# Patient Record
Sex: Female | Born: 1970 | Race: White | Hispanic: No | Marital: Married | Smoking: Never smoker
Health system: Southern US, Community
[De-identification: ages and names within clinical notes are randomized; demographics above are authoritative.]

## PROBLEM LIST (undated history)

## (undated) DIAGNOSIS — G4730 Sleep apnea, unspecified: Secondary | ICD-10-CM

## (undated) DIAGNOSIS — F419 Anxiety disorder, unspecified: Secondary | ICD-10-CM

## (undated) DIAGNOSIS — F32A Depression, unspecified: Secondary | ICD-10-CM

## (undated) DIAGNOSIS — T7840XA Allergy, unspecified, initial encounter: Secondary | ICD-10-CM

## (undated) DIAGNOSIS — I10 Essential (primary) hypertension: Secondary | ICD-10-CM

## (undated) DIAGNOSIS — L309 Dermatitis, unspecified: Secondary | ICD-10-CM

## (undated) DIAGNOSIS — T783XXA Angioneurotic edema, initial encounter: Secondary | ICD-10-CM

## (undated) DIAGNOSIS — F329 Major depressive disorder, single episode, unspecified: Secondary | ICD-10-CM

## (undated) HISTORY — PX: THYROID CYST EXCISION: SHX2511

## (undated) HISTORY — DX: Depression, unspecified: F32.A

## (undated) HISTORY — DX: Anxiety disorder, unspecified: F41.9

## (undated) HISTORY — DX: Allergy, unspecified, initial encounter: T78.40XA

## (undated) HISTORY — PX: DILATION AND CURETTAGE OF UTERUS: SHX78

## (undated) HISTORY — DX: Dermatitis, unspecified: L30.9

## (undated) HISTORY — DX: Angioneurotic edema, initial encounter: T78.3XXA

## (undated) HISTORY — DX: Essential (primary) hypertension: I10

## (undated) HISTORY — DX: Major depressive disorder, single episode, unspecified: F32.9

---

## 2003-04-14 ENCOUNTER — Emergency Department (HOSPITAL_COMMUNITY): Admission: EM | Admit: 2003-04-14 | Discharge: 2003-04-14 | Payer: Self-pay | Admitting: Emergency Medicine

## 2003-12-02 ENCOUNTER — Ambulatory Visit (HOSPITAL_COMMUNITY): Admission: RE | Admit: 2003-12-02 | Discharge: 2003-12-02 | Payer: Self-pay | Admitting: Obstetrics & Gynecology

## 2004-01-13 ENCOUNTER — Ambulatory Visit (HOSPITAL_COMMUNITY): Admission: RE | Admit: 2004-01-13 | Discharge: 2004-01-13 | Payer: Self-pay | Admitting: Obstetrics & Gynecology

## 2004-03-07 HISTORY — PX: TUBAL LIGATION: SHX77

## 2004-06-07 ENCOUNTER — Inpatient Hospital Stay (HOSPITAL_COMMUNITY): Admission: RE | Admit: 2004-06-07 | Discharge: 2004-06-10 | Payer: Self-pay | Admitting: Obstetrics & Gynecology

## 2004-06-07 ENCOUNTER — Encounter (INDEPENDENT_AMBULATORY_CARE_PROVIDER_SITE_OTHER): Payer: Self-pay | Admitting: Specialist

## 2005-08-18 ENCOUNTER — Ambulatory Visit: Payer: Self-pay | Admitting: Family Medicine

## 2005-08-19 ENCOUNTER — Encounter: Admission: RE | Admit: 2005-08-19 | Discharge: 2005-08-19 | Payer: Self-pay | Admitting: Sports Medicine

## 2006-05-12 ENCOUNTER — Telehealth: Payer: Self-pay | Admitting: *Deleted

## 2006-06-14 ENCOUNTER — Telehealth (INDEPENDENT_AMBULATORY_CARE_PROVIDER_SITE_OTHER): Payer: Self-pay | Admitting: *Deleted

## 2007-03-28 ENCOUNTER — Encounter (INDEPENDENT_AMBULATORY_CARE_PROVIDER_SITE_OTHER): Payer: Self-pay | Admitting: *Deleted

## 2007-03-28 ENCOUNTER — Telehealth: Payer: Self-pay | Admitting: *Deleted

## 2007-03-28 ENCOUNTER — Ambulatory Visit: Payer: Self-pay | Admitting: Family Medicine

## 2007-03-28 DIAGNOSIS — N390 Urinary tract infection, site not specified: Secondary | ICD-10-CM

## 2007-03-28 DIAGNOSIS — R8290 Unspecified abnormal findings in urine: Secondary | ICD-10-CM | POA: Insufficient documentation

## 2007-03-28 DIAGNOSIS — N760 Acute vaginitis: Secondary | ICD-10-CM | POA: Insufficient documentation

## 2007-03-28 LAB — CONVERTED CEMR LAB
Beta hcg, urine, semiquantitative: NEGATIVE
Bilirubin Urine: NEGATIVE
Blood in Urine, dipstick: NEGATIVE
Chlamydia, DNA Probe: NEGATIVE
Epithelial cells, urine: >20 /lpf
GC Probe Amp, Genital: NEGATIVE
Glucose, Urine, Semiquant: NEGATIVE
Ketones, urine, test strip: NEGATIVE
Nitrite: NEGATIVE
Protein, U semiquant: NEGATIVE
Specific Gravity, Urine: 1.025
Urobilinogen, UA: 0.2
Whiff Test: NEGATIVE
pH: 5.5

## 2007-04-03 ENCOUNTER — Encounter: Payer: Self-pay | Admitting: Family Medicine

## 2010-07-23 NOTE — Op Note (Signed)
Wanda Williams, Wanda Williams               ACCOUNT NO.:  [id number]   MEDICAL RECORD NO.:  [id number]          PATIENT TYPE:  INP   LOCATION:  9129                          FACILITY:  [facility]   PHYSICIAN:  Roseanna Rainbow, M.D.DATE OF BIRTH:  1970/07/25   DATE OF PROCEDURE:  06/07/2004  DATE OF DISCHARGE:                                 OPERATIVE REPORT   PREOPERATIVE DIAGNOSIS:  Intrauterine pregnancy at term, history of a  previous cesarean delivery, declines trial of labor, desires sterilization  procedure.   POSTOPERATIVE DIAGNOSIS:  Intrauterine pregnancy at term, history of a  previous cesarean delivery, declines trial of labor, desires sterilization  procedure.   PROCEDURE:  Repeat cesarean delivery and modified P[place]oy bilateral tubal  ligation.   SURGEONS:  Dr. Anne Fu   ANESTHESIA:  Spinal.   ESTIMATED BLOOD LOSS:  600 mL.   COMPLICATIONS:  None.   IV FLUIDS AND URINE OUTPUT:  As per anesthesiology.   DESCRIPTION OF PROCEDURE:  The patient was taken to the operating room and  spinal anesthetic was administered without difficulty. She was then placed  in the dorsal supine position with a leftward tilt and prepped and draped in  the usual sterile fashion. A Pfannenstiel skin incision was then made with  the scalpel through the previous scar and carried down to the underlying  fascia. The fascia was nicked in the midline. The fascial incision was then  extended bilaterally with curved Mayo scissors. The superior aspect of the  fascial incision was then tented up with Kocher clamps and the underlying  rectus muscles dissected off. The inferior aspect of the fascial incision  was manipulated in a similar fashion. The rectus muscles were separated in  the midline. The parietoperitoneum was tented up and entered sharply with  the scalpel. A peritoneal incision was then extended superiorly and  inferiorly with good visualization of the bladder. The bladder  blade was  then placed. The vesicouterine peritoneum was tented up and entered sharply  with the Metzenbaum scissors. The bladder flap was then created sharply. The  bladder blade was then replaced. The lower uterine segment was incised in a  transverse fashion with a scalpel. This incision was then extended  bilaterally with the bandage scissors. The infant's head was then delivered  atraumatically. A nuchal cord was noted and was readily reduced. The  oropharynx was suctioned with the bulb suction. The cord was clamped and  cut. The infant was handed off to the waiting neonatologist. The placenta  was then removed, the intrauterine cavity was evacuated of any remaining  amniotic fluid, clots and debris with a moist laparotomy sponge. The uterine  incision was then reapproximated in a running interlocking fashion with #0  Monocryl. A second imbricating layer was then placed. Adequate hemostasis  was noted. The uterus was then exteriorized. The mid isthmic portion of the  left fallopian tube was grasped with a Babcock clamp. Two free ligatures of  #0 plain were then placed. The segment of tube was then excised. The right  fallopian tube was then manipulated in a similar fashion. The  uterus was  returned to the abdomen. The paracolic gutters were then copiously  irrigated. The parietoperitoneum was reapproximated in a running fashion  with 2-0 Vicryl. The fascia was reapproximated in a  running fashion with #0 PDS. The skin was reapproximated with staples. At  the close of the procedure, the instrument and pack counts were said to be  correct x2. One gram of cephazolin was given at cord clamp. The patient was  taken to the PACU awake and in stable condition.      LAJ/MEDQ  D:  06/07/2004  T:  06/07/2004  Job:  [id number]
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
## 2010-11-10 ENCOUNTER — Other Ambulatory Visit: Payer: Self-pay | Admitting: Family Medicine

## 2010-11-10 ENCOUNTER — Ambulatory Visit (INDEPENDENT_AMBULATORY_CARE_PROVIDER_SITE_OTHER): Payer: Managed Care, Other (non HMO) | Admitting: Family Medicine

## 2010-11-10 ENCOUNTER — Encounter: Payer: Self-pay | Admitting: Family Medicine

## 2010-11-10 VITALS — BP 146/101 | HR 78 | Temp 98.8°F | Ht <= 58 in | Wt 164.0 lb

## 2010-11-10 DIAGNOSIS — I10 Essential (primary) hypertension: Secondary | ICD-10-CM | POA: Insufficient documentation

## 2010-11-10 DIAGNOSIS — Z23 Encounter for immunization: Secondary | ICD-10-CM

## 2010-11-10 DIAGNOSIS — R82998 Other abnormal findings in urine: Secondary | ICD-10-CM

## 2010-11-10 DIAGNOSIS — R5381 Other malaise: Secondary | ICD-10-CM

## 2010-11-10 NOTE — Patient Instructions (Addendum)
Nice to meet you. Your symptoms may be related to your high blood pressure 145/102. Normal is less than 130/80. Start HCTZ daily. Make an appointment for physical and BP follow up in next 2-3 weeks.  Hypertension (High Blood Pressure) As your heart beats, it forces blood through your arteries. This force is your blood pressure. If the pressure is too high, it is called hypertension (HTN) or high blood pressure. HTN is dangerous because you may have it and not know it. High blood pressure may mean that your heart has to work harder to pump blood. Your arteries may be narrow or stiff. The extra work puts you at risk for heart disease, stroke, and other problems.  Blood pressure consists of two numbers, a higher number over a lower, 110/72, for example. It is stated as "110 over 72." The ideal is below 120 for the top number (systolic) and under 80 for the bottom (diastolic). Write down your blood pressure today. You should pay close attention to your blood pressure if you have certain conditions such as:  Heart failure.  Prior heart attack.   Diabetes   Chronic kidney disease.   Prior stroke.   Multiple risk factors for heart disease.   To see if you have HTN, your blood pressure should be measured while you are seated with your arm held at the level of the heart. It should be measured at least twice. A one-time elevated blood pressure reading (especially in the Emergency Department) does not mean that you need treatment. There may be conditions in which the blood pressure is different between your right and left arms. It is important to see your caregiver soon for a recheck. Most people have essential hypertension which means that there is not a specific cause. This type of high blood pressure may be lowered by changing lifestyle factors such as:  Stress.  Smoking.   Lack of exercise.   Excessive weight.  Drug/tobacco/alcohol use.   Eating less salt.   Most people do not have symptoms  from high blood pressure until it has caused damage to the body. Effective treatment can often prevent, delay or reduce that damage. TREATMENT Treatment for high blood pressure, when a cause has been identified, is directed at the cause. There are a large number of medications to treat HTN. These fall into several categories, and your caregiver will help you select the medicines that are best for you. Medications may have side effects. You should review side effects with your caregiver. If your blood pressure stays high after you have made lifestyle changes or started on medicines,   Your medication(s) may need to be changed.   Other problems may need to be addressed.   Be certain you understand your prescriptions, and know how and when to take your medicine.   Be sure to follow up with your caregiver within the time frame advised (usually within two weeks) to have your blood pressure rechecked and to review your medications.   If you are taking more than one medicine to lower your blood pressure, make sure you know how and at what times they should be taken. Taking two medicines at the same time can result in blood pressure that is too low.  SEEK IMMEDIATE MEDICAL CARE IF YOU DEVELOP:  A severe headache, blurred or changing vision, or confusion.   Unusual weakness or numbness, or a faint feeling.   Severe chest or abdominal pain, vomiting, or breathing problems.  MAKE SURE YOU:  Understand these instructions.   Will watch your condition.   Will get help right away if you are not doing well or get worse.  Document Released: 02/21/2005 Document Re-Released: 08/11/2009 [facility] Patient Information 2011 [place], [place].

## 2010-11-11 ENCOUNTER — Telehealth: Payer: Self-pay | Admitting: Family Medicine

## 2010-11-11 DIAGNOSIS — D649 Anemia, unspecified: Secondary | ICD-10-CM

## 2010-11-11 DIAGNOSIS — R5381 Other malaise: Secondary | ICD-10-CM | POA: Insufficient documentation

## 2010-11-11 LAB — CBC
HCT: 35.2 % — ABNORMAL LOW (ref 36.0–46.0)
Hemoglobin: 10.8 g/dL — ABNORMAL LOW (ref 12.0–15.0)
MCH: 26.3 pg (ref 26.0–34.0)
MCHC: 30.7 g/dL (ref 30.0–36.0)
MCV: 85.6 fL (ref 78.0–100.0)
Platelets: 485 10*3/uL — ABNORMAL HIGH (ref 150–400)
RBC: 4.11 MIL/uL (ref 3.87–5.11)
RDW: 16.2 % — ABNORMAL HIGH (ref 11.5–15.5)
WBC: 7.4 10*3/uL (ref 4.0–10.5)

## 2010-11-11 LAB — COMPREHENSIVE METABOLIC PANEL
ALT: 14 U/L (ref 0–35)
AST: 14 U/L (ref 0–37)
Albumin: 4.4 g/dL (ref 3.5–5.2)
Alkaline Phosphatase: 60 U/L (ref 39–117)
BUN: 13 mg/dL (ref 6–23)
CO2: 24 mEq/L (ref 19–32)
Calcium: 9.6 mg/dL (ref 8.4–10.5)
Chloride: 104 mEq/L (ref 96–112)
Creat: 0.55 mg/dL (ref 0.50–1.10)
Glucose, Bld: 109 mg/dL — ABNORMAL HIGH (ref 70–99)
Potassium: 4.3 mEq/L (ref 3.5–5.3)
Sodium: 141 mEq/L (ref 135–145)
Total Bilirubin: 0.2 mg/dL — ABNORMAL LOW (ref 0.3–1.2)
Total Protein: 7.3 g/dL (ref 6.0–8.3)

## 2010-11-11 LAB — ANEMIA PANEL
%SAT: 4 % — ABNORMAL LOW (ref 20–55)
ABS Retic: 45 10*3/uL (ref 19.0–186.0)
Ferritin: 6 ng/mL — ABNORMAL LOW (ref 10–291)
Folate: >20 ng/mL
Iron: 17 ug/dL — ABNORMAL LOW (ref 42–145)
RBC.: 4.09 MIL/uL (ref 3.87–5.11)
Retic Ct Pct: 1.1 % (ref 0.4–2.3)
TIBC: 454 ug/dL (ref 250–470)
UIBC: 437 ug/dL — ABNORMAL HIGH (ref 125–400)
Vitamin B-12: 310 pg/mL (ref 211–911)

## 2010-11-11 LAB — TSH: TSH: 1.805 u[IU]/mL (ref 0.350–4.500)

## 2010-11-11 MED ORDER — HYDROCHLOROTHIAZIDE 25 MG PO TABS: 25.0000 mg | ORAL_TABLET | Freq: Every day | ORAL | Status: DC

## 2010-11-11 NOTE — Assessment & Plan Note (Signed)
Possibly related to stress with husband's medical conditions vs organic causes. WIll start with CBC, BMET, TSH today. Follow up with [facility] in next 2 weeks for routine screening.

## 2010-11-11 NOTE — Progress Notes (Signed)
  Subjective:    Patient ID: Wanda Williams, female    DOB: [birth year], 40 y.o.   MRN: [id number]  HPI  1. Fatigue/"not feeling well"--Complains for several months she is not feeling well. Feels tired and sometimes nervous. A small level of anxiety regarding her husband's health problems, who had a back injury and planned for back surgery next week. On ROS questioning, she does recall some episodes of fluttering in her chest, but cannot recall the last episode. Denies chest pain, dyspnea, irregular heartbeat, panic attacks, weight changes.  2. Urine odor. For past year has an odor to her urine. No dysuria, vaginal discharge, abdominal pain or fevers.  3. HM. Having regular menstrual periods lasting 6-7 days. Does not some bleeding after intercourse for 2 days after her menses is complete, otherwise no bleeding between cycles.   4. BP. Has never been told she has high BP. Measures infrequently at home but last she remembers was 120s/70s this year. Denies HA, CP, edema. Has never smoked tobacco.   Review of Systems See HPI otherwise negative.     Objective:   Physical Exam  Vitals reviewed. Constitutional: She is oriented to person, place, and time. She appears well-developed and well-nourished. No distress.  HENT:  Head: Normocephalic and atraumatic.  Mouth/Throat: Oropharynx is clear and moist. No oropharyngeal exudate.  Eyes: EOM are normal. Pupils are equal, round, and reactive to light.  Neck: Neck supple. No thyromegaly present.  Cardiovascular: Normal rate, regular rhythm, normal heart sounds and intact distal pulses.   No murmur heard. Pulmonary/Chest: Effort normal and breath sounds normal. No respiratory distress. She has no wheezes. She has no rales.  Musculoskeletal: She exhibits no edema and no tenderness.  Lymphadenopathy:    She has no cervical adenopathy.  Neurological: She is alert and oriented to person, place, and time. No cranial nerve deficit. Coordination normal.    Skin: She is not diaphoretic.          Assessment & Plan:

## 2010-11-11 NOTE — Telephone Encounter (Signed)
Called patient to discuss anemia. She will start oral OTC iron supplementation and follow up at CPE next month. Her BP is 128/81 at work today. She will start HCTZ at 12.5 mg daily instead of whole tablet.

## 2010-11-11 NOTE — Assessment & Plan Note (Signed)
New problem today. No signs of end-organ damage, will check labs today to evaluate for secondary etiologies. Start HCTZ and follow up in next 2 weeks to assess response. Goal is <140/90.

## 2010-11-11 NOTE — Assessment & Plan Note (Addendum)
Chronic odor, but no signs or symptoms of UTI. Will check wet prep to evaluate for BV when patient follows up for CPE or check UA if dysuria or additional symptoms develop.

## 2010-11-29 ENCOUNTER — Encounter: Payer: Self-pay | Admitting: Family Medicine

## 2010-11-29 ENCOUNTER — Ambulatory Visit (INDEPENDENT_AMBULATORY_CARE_PROVIDER_SITE_OTHER): Payer: Managed Care, Other (non HMO) | Admitting: Family Medicine

## 2010-11-29 ENCOUNTER — Other Ambulatory Visit (HOSPITAL_COMMUNITY)
Admission: RE | Admit: 2010-11-29 | Discharge: 2010-11-29 | Disposition: A | Discharge status: Home / Self Care | Payer: Managed Care, Other (non HMO) | Source: Ambulatory Visit | Attending: Family Medicine | Admitting: Family Medicine

## 2010-11-29 VITALS — BP 144/86 | HR 72 | Temp 98.2°F | Ht <= 58 in | Wt 167.8 lb

## 2010-11-29 DIAGNOSIS — Z23 Encounter for immunization: Secondary | ICD-10-CM

## 2010-11-29 DIAGNOSIS — R82998 Other abnormal findings in urine: Secondary | ICD-10-CM

## 2010-11-29 DIAGNOSIS — D509 Iron deficiency anemia, unspecified: Secondary | ICD-10-CM

## 2010-11-29 DIAGNOSIS — Z01419 Encounter for gynecological examination (general) (routine) without abnormal findings: Secondary | ICD-10-CM | POA: Insufficient documentation

## 2010-11-29 DIAGNOSIS — I10 Essential (primary) hypertension: Secondary | ICD-10-CM

## 2010-11-29 DIAGNOSIS — Z124 Encounter for screening for malignant neoplasm of cervix: Secondary | ICD-10-CM

## 2010-11-29 DIAGNOSIS — N760 Acute vaginitis: Secondary | ICD-10-CM

## 2010-11-29 DIAGNOSIS — Z862 Personal history of diseases of the blood and blood-forming organs and certain disorders involving the immune mechanism: Secondary | ICD-10-CM | POA: Insufficient documentation

## 2010-11-29 DIAGNOSIS — R8290 Unspecified abnormal findings in urine: Secondary | ICD-10-CM

## 2010-11-29 LAB — CBC
HCT: 34.6 % — ABNORMAL LOW (ref 36.0–46.0)
Hemoglobin: 11 g/dL — ABNORMAL LOW (ref 12.0–15.0)
MCH: 27.5 pg (ref 26.0–34.0)
MCHC: 31.8 g/dL (ref 30.0–36.0)
MCV: 86.5 fL (ref 78.0–100.0)
Platelets: 444 10*3/uL — ABNORMAL HIGH (ref 150–400)
RBC: 4 MIL/uL (ref 3.87–5.11)
RDW: 17.5 % — ABNORMAL HIGH (ref 11.5–15.5)
WBC: 6.1 10*3/uL (ref 4.0–10.5)

## 2010-11-29 LAB — POCT WET PREP (WET MOUNT)
Clue Cells Wet Prep HPF POC: NEGATIVE
Trichomonas Wet Prep HPF POC: NEGATIVE
Yeast Wet Prep HPF POC: NEGATIVE

## 2010-11-29 LAB — LIPID PANEL
Cholesterol: 147 mg/dL (ref 0–200)
HDL: 51 mg/dL (ref >39)
LDL Cholesterol: 75 mg/dL (ref 0–99)
Total CHOL/HDL Ratio: 2.9 Ratio
Triglycerides: 105 mg/dL (ref <150)
VLDL: 21 mg/dL (ref 0–40)

## 2010-11-29 LAB — POCT GLYCOSYLATED HEMOGLOBIN (HGB A1C): Hemoglobin A1C: 5.6

## 2010-11-29 NOTE — Assessment & Plan Note (Signed)
Likely secondary to menstrual bleeding. On oral iron replacement for several weeks. Will check CBC today and continue iron sulfate. Continue routine cancer screening and pap done today.

## 2010-11-29 NOTE — Patient Instructions (Signed)
I will call you with results if abnormal. Make an appointment in 6 months or sooner if needed. Continue checking BP at home for goal less than 140/90.  Iron Deficiency Anemia There are many types of anemia. Iron deficiency anemia is the most common. Iron deficiency anemia is a decrease in the number of red blood cells caused by too little iron. Without enough iron, your body does not produce enough hemoglobin. Hemoglobin is a substance in red blood cells that carries oxygen to the body's tissues. Iron deficiency anemia may leave you tired and short of breath. CAUSES  Lack of iron in the diet.   This may be seen in infants and children, because there is little iron in milk.   This may be seen in adults who do not eat enough iron-rich foods.   This may be seen in pregnant or breastfeeding women who do not take iron supplements. There is a much higher need for iron intake at these times.   Poor absorption of iron, as seen with intestinal disorders, such as surgical removal of the small intestines or intestinal bypass.   Intestinal bleeding.   Heavy periods.  SYMPTOMS Mild anemia may not be noticeable. Symptoms may include:  Fatigue.   Headache.   Pale skin.   Weakness.   Shortness of breath.   Dizziness.   Cold hands and feet.   Fast or irregular heartbeat.  DIAGNOSIS Diagnosis requires a thorough evaluation and physical exam by your caregiver.  Blood tests are generally used to confirm iron deficiency anemia.   Additional tests may be done to find the underlying cause of your anemia. This may include:   Testing for blood in the stool (fecal occult blood test).   A procedure to see inside the colon and rectum (colonoscopy).   A procedure to see inside the esophagus and stomach (endoscopy).  TREATMENT  Correcting the cause of the iron deficiency is the first step.   Medicines, such as oral contraceptives, can make heavy menstrual flows lighter.   Antibiotics and  other medicines can be used to treat peptic ulcers.   Surgery may be needed to remove a bleeding polyp, tumor, or fibroid.   Often, iron supplements (ferrous sulfate) are taken.   For the best iron absorption, take these supplements with an empty stomach.   You may need to take the supplements with food if you cannot tolerate them on an empty stomach. Vitamin C improves the absorption of iron. Your caregiver might recommend taking your iron tablets with a glass of orange juice or vitamin C supplement.   Milk and antacids should not be taken at the same time as iron supplements. They may interfere with the absorption of iron.   Iron supplements can cause constipation. A stool softener is often recommended.   Pregnant and breastfeeding women will need to take extra iron, because their normal diet usually will not provide the required amount.   Patients who cannot tolerate iron by mouth can take it through a vein (intravenously) or by an injection into the muscle.  HOME CARE INSTRUCTIONS  Ask your dietitian for help with diet questions.   Take iron and vitamins as directed by your caregiver.   Eat a diet rich in iron. Eat liver, lean beef, whole-grain bread, eggs, dried fruit, and dark green, leafy vegetables.  SEEK IMMEDIATE MEDICAL CARE IF:  You have a fainting episode. Do not drive yourself. Call your local emergency services  (911 in [place] if no other help  is available.   You have chest pain, nausea, or vomiting.   You develop severe or increased shortness of breath with activities.   You develop weakness or increased thirst.   You have a rapid heartbeat.   You develop unexplained sweating or become lightheaded when getting up from a chair or bed.  MAKE SURE YOU:  Understand these instructions.   Will watch your condition.   Will get help right away if you are not doing well or get worse.  Document Released: 02/19/2000 Document Re-Released: 08/11/2009 [facility] Patient  Information 2011 [place], [place].

## 2010-11-29 NOTE — Assessment & Plan Note (Signed)
Will check wet prep to evaluate for BV. No symptoms of UTI (dysuria, fever, abdominal pain, frequency).

## 2010-11-29 NOTE — Progress Notes (Signed)
  Subjective:    Patient ID: Wanda Williams, female    DOB: [birth year], 40 y.o.   MRN: [id number]  HPI  1. HTN. Home measurements 112-130s/78-81 usually. Most values in 130s/70s per patient. She took HCTZ for several weeks but stopped because of a sore thigh muscle that has resolved now. Denies dyspnea, CP, edema, visual changes, syncope.  2. Anemia. Has been taking oral iron since our last discussion. No side effects. No abnormal bleeding, usually has regular menstrual cycles but did miss her last period. Has a hx of BTL.   3. HM. Home breast exams normal. Requests flu shot today. No change in weight. No history of abnormal pap smears.   Review of Systems See HPI othewise negative.    Objective:   Physical Exam  Constitutional: She is oriented to person, place, and time. She appears well-developed and well-nourished. No distress.  HENT:  Head: Normocephalic and atraumatic.  Eyes: EOM are normal. Pupils are equal, round, and reactive to light.  Neck: Neck supple. No thyromegaly present.  Cardiovascular: Normal rate, regular rhythm and normal heart sounds.  Exam reveals no gallop.   No murmur heard. Pulmonary/Chest: Effort normal and breath sounds normal.  Abdominal: Soft. She exhibits no distension. There is no tenderness.  Genitourinary: Vagina normal and uterus normal. No vaginal discharge found.       Breast exam wnl. No masses or skin changes.   Cervix wnl. Scant discharge. No tenderness on bimanual exam.  Musculoskeletal: Normal range of motion. She exhibits no edema and no tenderness.  Neurological: She is alert and oriented to person, place, and time. Coordination normal.  Skin: She is not diaphoretic.  Psychiatric: She has a normal mood and affect.          Assessment & Plan:

## 2010-11-29 NOTE — Assessment & Plan Note (Addendum)
At goal <135/80 usually at home. Encouraged patient to focus on lifestyle modifications and can avoid medications at this time pending no elevation in BP. She will check BP with cuff at home weekly. Will check risk stratification labs today: FLP, fasting glucose.  Follow up in 6 months.

## 2010-11-30 ENCOUNTER — Encounter: Payer: Self-pay | Admitting: Family Medicine

## 2012-02-22 ENCOUNTER — Encounter: Payer: Self-pay | Admitting: Family Medicine

## 2012-02-22 ENCOUNTER — Ambulatory Visit (INDEPENDENT_AMBULATORY_CARE_PROVIDER_SITE_OTHER): Payer: Managed Care, Other (non HMO) | Admitting: Family Medicine

## 2012-02-22 VITALS — BP 144/98 | HR 81 | Temp 98.3°F | Ht <= 58 in | Wt 175.0 lb

## 2012-02-22 DIAGNOSIS — M76899 Other specified enthesopathies of unspecified lower limb, excluding foot: Secondary | ICD-10-CM

## 2012-02-22 DIAGNOSIS — M7061 Trochanteric bursitis, right hip: Secondary | ICD-10-CM

## 2012-02-22 NOTE — Patient Instructions (Signed)

## 2012-02-22 NOTE — Progress Notes (Signed)
Wanda Williams is a 41 y.o. female who presents today for right hip pain x 4 days.  Began after awakening and worse with standing and flexing up her right leg.  Painful especially over the greater trochanter and worse when she lays on that area.  Pain does radiate down the lateral aspect of her leg to the lateral knee but does not cross the joint line.  Does not have fever, chills, numbness, tingling, burning, back pain, opposite hip pain.      Current Outpatient Prescriptions on File Prior to Visit  Medication Sig Dispense Refill  . ferrous sulfate 325 (65 FE) MG tablet Take 325 mg by mouth daily with breakfast.        . hydrochlorothiazide 25 MG tablet Take 1 tablet (25 mg total) by mouth daily.  90 tablet  1    ROS: Per HPI.  All other systems reviewed and are negative.   Physical Exam Filed Vitals:   02/22/12 1555  BP: 144/98  Pulse: 81  Temp: 98.3 F (36.8 C)    Physical Examination: General appearance - alert, well appearing, and in no distress and anxious Neurological - DTR's normal and symmetric, motor and sensory grossly normal bilaterally, normal muscle tone, no tremors, strength 5/5 Musculoskeletal - abnormal exam of right hip.  Full ROM of right hip but pain with adduction.  TTP at right greater trochanteric bursa. L hip normal ROM Extremities - peripheral pulses normal, no pedal edema, no clubbing or cyanosis, feet normal, good pulses, normal color, temperature and sensation

## 2012-02-22 NOTE — Assessment & Plan Note (Addendum)
Will do injection of dose of 1:4 Depo 80 AV:[id number] into joint.  Recommendations to ice the area tonight and to not do anything too strenuous over the next 24 hrs.  Risks and Benefits went over with pt.   Informed consent obtained and placed in chart.  Time out performed.  Area cleaned with iodine x 3 and wiped clear with alcohol swab.  Using 21 1/2 gauge needle 1 cc Depo and 4 cc's 1% Lidocaine were injected in greater trochanter of right hip.  Sterile bandage placed.  Patient tolerated procedure well.  No complications.

## 2012-03-05 ENCOUNTER — Ambulatory Visit: Payer: Managed Care, Other (non HMO) | Admitting: Family Medicine

## 2012-03-15 ENCOUNTER — Ambulatory Visit (INDEPENDENT_AMBULATORY_CARE_PROVIDER_SITE_OTHER): Payer: Managed Care, Other (non HMO) | Admitting: Family Medicine

## 2012-03-15 ENCOUNTER — Encounter: Payer: Self-pay | Admitting: Family Medicine

## 2012-03-15 VITALS — BP 128/74 | HR 74 | Temp 98.7°F | Ht <= 58 in | Wt 171.1 lb

## 2012-03-15 DIAGNOSIS — M25521 Pain in right elbow: Secondary | ICD-10-CM

## 2012-03-15 DIAGNOSIS — M25529 Pain in unspecified elbow: Secondary | ICD-10-CM

## 2012-03-15 DIAGNOSIS — I10 Essential (primary) hypertension: Secondary | ICD-10-CM

## 2012-03-15 DIAGNOSIS — M76899 Other specified enthesopathies of unspecified lower limb, excluding foot: Secondary | ICD-10-CM

## 2012-03-15 DIAGNOSIS — M7061 Trochanteric bursitis, right hip: Secondary | ICD-10-CM

## 2012-03-15 LAB — SEDIMENTATION RATE: Sed Rate: 27 mm/hr — ABNORMAL HIGH (ref 0–22)

## 2012-03-15 LAB — COMPREHENSIVE METABOLIC PANEL
ALT: 16 U/L (ref 0–35)
AST: 17 U/L (ref 0–37)
Albumin: 4.4 g/dL (ref 3.5–5.2)
Alkaline Phosphatase: 74 U/L (ref 39–117)
BUN: 18 mg/dL (ref 6–23)
CO2: 31 mEq/L (ref 19–32)
Calcium: 9 mg/dL (ref 8.4–10.5)
Chloride: 102 mEq/L (ref 96–112)
Creat: 0.62 mg/dL (ref 0.50–1.10)
Glucose, Bld: 92 mg/dL (ref 70–99)
Potassium: 3.7 mEq/L (ref 3.5–5.3)
Sodium: 140 mEq/L (ref 135–145)
Total Bilirubin: 0.3 mg/dL (ref 0.3–1.2)
Total Protein: 6.9 g/dL (ref 6.0–8.3)

## 2012-03-15 LAB — CBC
HCT: 35.8 % — ABNORMAL LOW (ref 36.0–46.0)
Hemoglobin: 12.4 g/dL (ref 12.0–15.0)
MCH: 30.4 pg (ref 26.0–34.0)
MCHC: 34.6 g/dL (ref 30.0–36.0)
MCV: 87.7 fL (ref 78.0–100.0)
Platelets: 467 10*3/uL — ABNORMAL HIGH (ref 150–400)
RBC: 4.08 MIL/uL (ref 3.87–5.11)
RDW: 13.8 % (ref 11.5–15.5)
WBC: 10.4 10*3/uL (ref 4.0–10.5)

## 2012-03-15 LAB — URIC ACID: Uric Acid, Serum: 5.7 mg/dL (ref 2.4–7.0)

## 2012-03-15 LAB — TSH: TSH: 2.769 u[IU]/mL (ref 0.350–4.500)

## 2012-03-15 MED ORDER — HYDROCHLOROTHIAZIDE 25 MG PO TABS: 12.5000 mg | ORAL_TABLET | Freq: Every day | ORAL | Status: DC

## 2012-03-15 NOTE — Patient Instructions (Addendum)
Goal BP is less than 140/90. If labs ok, will send letter. Go down to half tablet HCTZ, I sent refills. Get BP checked and report this back, call and leave message for Dr. Cristal Ford. Make appointment if hip/elbow doesn't improve, make appointment. Follow up in 6 months.

## 2012-03-16 ENCOUNTER — Encounter: Payer: Self-pay | Admitting: Family Medicine

## 2012-03-16 NOTE — Progress Notes (Signed)
  Subjective:    Patient ID: Wanda Williams, female    DOB: January 06, 1971, 42 y.o.   MRN: [id number]  HPI  1. HTN. Follows up after one year. She was inconsistent with her HCTZ but now taking 25 mg daily. Reports fluctuating control, but not checking frequently currently. Denies medication side effects, chest pain, edema, dyspnea, visual changes, headaches.   2. Right elbow pain. Notices ache on right lateral elbow for few days-weeks. This started after she was treated for hip bursitis with steroid injection 2 weeks ago.   Review of Systems Denies trauma, straining, heavy lifting, edema, redness, warmth, fever, chills, numbness, weakness, neck or shoulder pain.     Objective:   Physical Exam  Vitals reviewed. Constitutional: She is oriented to person, place, and time. She appears well-developed and well-nourished. No distress.  HENT:  Head: Normocephalic and atraumatic.  Mouth/Throat: Oropharynx is clear and moist. No oropharyngeal exudate.  Eyes: EOM are normal.  Neck: Neck supple. No thyromegaly present.  Cardiovascular: Normal rate, regular rhythm and normal heart sounds.   No murmur heard. Pulmonary/Chest: Effort normal and breath sounds normal. No respiratory distress. She has no wheezes. She has no rales.  Musculoskeletal: She exhibits no edema and no tenderness.       Right elbow appears wnl. No masses or erythema, no edema or bone defect.  Mild pain with full extension of elbow, ROM intact. Distal grip strength and sensation intact.   Neurological: She is alert and oriented to person, place, and time.  Skin: No rash noted. She is not diaphoretic.  Psychiatric: She has a normal mood and affect.          Assessment & Plan:

## 2012-03-16 NOTE — Assessment & Plan Note (Signed)
No red flags on exam, possibly some mild tendonitis. No definite signs of gout or other inflammatory arthritis, but slightly odd to have multiple joint aches simultaneously without a known trigger. Will check labs ESR, uric acid and CMET, CBC. Recommend prn motrin for now and observation. RTC if worsens or does not resolve will check plain films.

## 2012-03-16 NOTE — Assessment & Plan Note (Signed)
At goal on HCTZ full dose. Per our last conversation, her BP was actually at goal without medication. We discussed using low dose HCTZ at 12.5 mg daily to minimize any side effects, in the low chance her joint pains represent gout. Check labs today.

## 2012-03-16 NOTE — Assessment & Plan Note (Signed)
Improved today. Advised to f/u if doesn't completely resolve.

## 2012-10-29 ENCOUNTER — Encounter: Payer: Managed Care, Other (non HMO) | Admitting: Family Medicine

## 2013-02-01 ENCOUNTER — Emergency Department (HOSPITAL_COMMUNITY)
Admission: EM | Admit: 2013-02-01 | Discharge: 2013-02-02 | Disposition: A | Discharge status: Home / Self Care | Payer: Managed Care, Other (non HMO) | Attending: Emergency Medicine | Admitting: Emergency Medicine

## 2013-02-01 ENCOUNTER — Encounter (HOSPITAL_COMMUNITY): Payer: Self-pay | Admitting: Emergency Medicine

## 2013-02-01 DIAGNOSIS — Z79899 Other long term (current) drug therapy: Secondary | ICD-10-CM | POA: Insufficient documentation

## 2013-02-01 DIAGNOSIS — J029 Acute pharyngitis, unspecified: Secondary | ICD-10-CM | POA: Insufficient documentation

## 2013-02-01 DIAGNOSIS — R1110 Vomiting, unspecified: Secondary | ICD-10-CM | POA: Insufficient documentation

## 2013-02-01 LAB — RAPID STREP SCREEN (MED CTR MEBANE ONLY): Streptococcus, Group A Screen (Direct): NEGATIVE

## 2013-02-01 MED ORDER — HYDROCOD POLST-CHLORPHEN POLST 10-8 MG/5ML PO LQCR
5.0000 mL | Freq: Once | ORAL | Status: AC
  Administered 2013-02-01: 5 mL via ORAL
  Filled 2013-02-01: qty 5

## 2013-02-01 MED ORDER — ALBUTEROL SULFATE HFA 108 (90 BASE) MCG/ACT IN AERS
2.0000 | INHALATION_SPRAY | Freq: Once | RESPIRATORY_TRACT | Status: AC
  Administered 2013-02-01: 2 via RESPIRATORY_TRACT
  Filled 2013-02-01: qty 6.7

## 2013-02-01 NOTE — ED Notes (Signed)
Pt has a ride home.  

## 2013-02-01 NOTE — ED Notes (Signed)
Pt states it feels like her neck is swollen. C/o cough. States her chest hurts when she coughs. Pt has hoarse voice. Pt also states she vomited once. States symptoms started yesterday. Pt with no acute distress. Skin warm and dry.

## 2013-02-01 NOTE — ED Provider Notes (Signed)
CSN: [id number]     Arrival date & time 02/01/13  2109 History  This chart was scribed for non-physician practitioner, Kyung Bacca, PA-C working with Lyanne Co, MD by Greggory Stallion, ED scribe. This patient was seen in room WTR9/WTR9 and the patient's care was started at 11:13 PM.   Chief Complaint  Patient presents with  . Cough  . General Illness    The history is provided by the patient. No language interpreter was used.   HPI Comments: Wanda Williams is a 42 y.o. female who presents to the Emergency Department complaining of gradual onset, constant sore throat that started yesterday. Pt has associated cough and one episode of emesis due to the cough.   Cough aggravates throat pain.  She states her lymph nodes are very swollen.  She had an episode of dyspnea and wheezing while lying in bed this evening and her husband insisted she come to ED.   No associated fever, nasal congestion, rhinorrhea.  No CP other than when she coughs. Daughter with similar sx recently.  H/o thyroid cyst resection. hh No past medical history on file. Past Surgical History  Procedure Laterality Date  . Cesarean section    . Thyroid cyst excision     Family History  Problem Relation Age of Onset  . Heart disease Father    History  Substance Use Topics  . Smoking status: Never Smoker   . Smokeless tobacco: Not on file  . Alcohol Use: Yes     Comment: rare   OB History   Grav Para Term Preterm Abortions TAB SAB Ect Mult Living                 Review of Systems  Constitutional: Negative for fever.  HENT: Positive for sore throat.   Respiratory: Positive for cough. Negative for shortness of breath.   Gastrointestinal: Positive for vomiting.  All other systems reviewed and are negative.    Allergies  Review of patient's allergies indicates no known allergies.  Home Medications   Current Outpatient Rx  Name  Route  Sig  Dispense  Refill  . ferrous sulfate 325 (65 FE) MG tablet    Oral   Take 325 mg by mouth daily with breakfast.           . hydrochlorothiazide (HYDRODIURIL) 25 MG tablet   Oral   Take 0.5 tablets (12.5 mg total) by mouth daily.   60 tablet   3    BP 134/88  Pulse 78  Temp(Src) 98.3 F (36.8 C) (Oral)  Resp 17  SpO2 100%  Physical Exam  Nursing note and vitals reviewed. Constitutional: She is oriented to person, place, and time. She appears well-developed and well-nourished. No distress.  HENT:  Head: Normocephalic and atraumatic.  Posterior pharynx mildly erythematous.  No tonsillar edema or exudate.  Uvula mid-line.  No trismus.    Eyes:  Normal appearance  Neck: Normal range of motion. Neck supple. No tracheal deviation present. No thyromegaly present.  No neck edema.  Horizontal scar overlying thyroid.  No thyroid mass.  Pulmonary/Chest: Effort normal. No stridor.  Musculoskeletal: Normal range of motion.  Lymphadenopathy:    She has cervical adenopathy.  Neurological: She is alert and oriented to person, place, and time.  Psychiatric: She has a normal mood and affect. Her behavior is normal.    ED Course  Procedures (including critical care time)  DIAGNOSTIC STUDIES: Oxygen Saturation is 100% on RA, normal by my interpretation.  Labs Review Labs Reviewed - No data to display Imaging Review No results found.  EKG Interpretation   None       MDM   1. Viral pharyngitis   42yo F w/ PMH sig for thyroid cyst excision, presents w/ sore throat and cough since yesterday.  Had an episode of dyspnea and wheezing in bed this evening prompting her to come to ED, though these sx have resolved.  Initially reported neck edema but clarified later that she was referring to cervical lymphadenopathy.  Has not experienced throat tightening.  On exam, afebrile, unremarkable ENT, no neck edema, trachea mid-line, no abnormalities of thyroid, no stridor or wheezing.  Strep screen negative. Pt received an albuterol inhaler w/ relief of  cough.  Prescribed 6 vicodin for pain as well as cough suppression.  Recommended f/u w/ PCP Monday and return for worsening throat pain or persistent dyspnea over weekend.    I personally performed the services described in this documentation, which was scribed in my presence. The recorded information has been reviewed and is accurate.   [name], PA-C 02/02/13 [phone]

## 2013-02-02 MED ORDER — HYDROCODONE-ACETAMINOPHEN 5-325 MG PO TABS: 1.0000 | ORAL_TABLET | ORAL | Status: DC | PRN

## 2013-02-02 MED ORDER — PREDNISONE 20 MG PO TABS
60.0000 mg | ORAL_TABLET | Freq: Once | ORAL | Status: AC
Start: 2013-02-02 — End: 2013-02-02
  Administered 2013-02-02: 60 mg via ORAL
  Filled 2013-02-02: qty 3

## 2013-02-03 LAB — CULTURE, GROUP A STREP

## 2013-02-03 NOTE — ED Provider Notes (Signed)
Medical screening examination/treatment/procedure(s) were performed by non-physician practitioner and as supervising physician I was immediately available for consultation/collaboration.  EKG Interpretation   None        Juliet Rude. Rubin Payor, MD 02/03/13 1456

## 2013-02-22 ENCOUNTER — Ambulatory Visit (INDEPENDENT_AMBULATORY_CARE_PROVIDER_SITE_OTHER): Payer: Managed Care, Other (non HMO) | Admitting: Emergency Medicine

## 2013-02-22 ENCOUNTER — Encounter: Payer: Self-pay | Admitting: Emergency Medicine

## 2013-02-22 VITALS — BP 138/97 | HR 65 | Temp 97.9°F | Ht <= 58 in | Wt 164.4 lb

## 2013-02-22 DIAGNOSIS — R05 Cough: Secondary | ICD-10-CM | POA: Insufficient documentation

## 2013-02-22 MED ORDER — DOXYCYCLINE HYCLATE 100 MG PO TABS: 100.0000 mg | ORAL_TABLET | Freq: Two times a day (BID) | ORAL | Status: DC

## 2013-02-22 NOTE — Assessment & Plan Note (Addendum)
Likely bronchitis.  No smoking history. Given duration of symptoms, will treat with doxycycline 100mg  BID x10 days. Symptomatic care as in AVS. Follow up if not improving in 1 week.

## 2013-02-22 NOTE — Patient Instructions (Signed)
It was nice to see you!  I think you have bronchitis.  It is probably caused by a virus, but given your symptoms, I will treat with antibiotics.  Take doxycycline 1 pill twice a day for 10 days. Continue the mucinex. Take a teaspoon of watered down honey as needed for cough.  Follow up in 1 week if not improving.

## 2013-02-22 NOTE — Progress Notes (Signed)
   Subjective:    Patient ID: Wanda Williams, female    DOB: 09/23/1970, 42 y.o.   MRN: [id number]  HPI Wanda Williams is here for a SDA for cough.  She reports a cough for the last 3-4 weeks.  Associated with hoarseness, chest congestion, wheezing.  This was initially had loss of voice, but this has started to improve.  Initially had really swollen glands, but this has resolved.  Denies any fevers, abdominal pain.  Has had some post-tussive emesis.  Will have some chest pain and throat pain with cough.  Using albuterol for the wheezing.    I have reviewed and updated the following as appropriate: allergies and current medications SHx: never smoker  Review of Systems See HPI    Objective:   Physical Exam BP 138/97  Pulse 65  Temp(Src) 97.9 F (36.6 C) (Oral)  Ht 4\' 9"  (1.448 m)  Wt 164 lb 6.4 oz (74.571 kg)  BMI 35.57 kg/m2  SpO2 99%  LMP 02/16/2013 Gen: alert, cooperative, NAD, hoarse voice HEENT: AT/[place], sclera white, MMM, no pharyngeal erythema or exudate Neck: supple, no LAD CV: RRR, no murmurs Pulm: good air movement, scattered wheezes, no rhonchi or rales      Assessment & Plan:

## 2013-09-04 ENCOUNTER — Ambulatory Visit (INDEPENDENT_AMBULATORY_CARE_PROVIDER_SITE_OTHER): Payer: Managed Care, Other (non HMO) | Admitting: Family Medicine

## 2013-09-04 ENCOUNTER — Encounter: Payer: Self-pay | Admitting: Family Medicine

## 2013-09-04 VITALS — BP 158/102 | HR 76 | Ht <= 58 in | Wt 165.0 lb

## 2013-09-04 DIAGNOSIS — F418 Other specified anxiety disorders: Secondary | ICD-10-CM | POA: Insufficient documentation

## 2013-09-04 DIAGNOSIS — I10 Essential (primary) hypertension: Secondary | ICD-10-CM

## 2013-09-04 DIAGNOSIS — F341 Dysthymic disorder: Secondary | ICD-10-CM

## 2013-09-04 DIAGNOSIS — Z862 Personal history of diseases of the blood and blood-forming organs and certain disorders involving the immune mechanism: Secondary | ICD-10-CM

## 2013-09-04 DIAGNOSIS — R002 Palpitations: Secondary | ICD-10-CM

## 2013-09-04 DIAGNOSIS — Z13220 Encounter for screening for lipoid disorders: Secondary | ICD-10-CM

## 2013-09-04 MED ORDER — CITALOPRAM HYDROBROMIDE 20 MG PO TABS: 20.0000 mg | ORAL_TABLET | Freq: Every day | ORAL | Status: DC

## 2013-09-04 MED ORDER — LISINOPRIL 10 MG PO TABS: 10.0000 mg | ORAL_TABLET | Freq: Every day | ORAL | Status: DC

## 2013-09-04 NOTE — Assessment & Plan Note (Signed)
Starting Lisinopril today. Patient to return for BMP in ~ 1 week.

## 2013-09-04 NOTE — Assessment & Plan Note (Addendum)
Patient with symptoms/complaints consistent with mainly depression (with likely component of anxiety as well).  Underlying life events/stressors also definitely contributing and are likely the main causative factor.  After lengthy discussion, patient wanted to pursue pharmacotherapy.  Patient was started on Celexa today and I recommended follow up in ~ 6 weeks.  Additionally, patient was given Dr. Tod Persia contact information as therapy will be of great benefit as well.  A total of 30 minutes were spent face-to-face with the patient during this encounter and over half of that time was spent on counseling and coordination of care.

## 2013-09-04 NOTE — Patient Instructions (Addendum)
It was nice to see you today.  I have prescribed lisinopril for you.  Follow up in 1 week for labs.  I have also prescribed Celexa for your anxiety/depression.  Followup in ~ 6 weeks.
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
## 2013-09-04 NOTE — Addendum Note (Signed)
Addended by: Coral Spikes on: 09/04/2013 09:16 PM   Modules accepted: Orders

## 2013-09-06 ENCOUNTER — Telehealth: Payer: Self-pay | Admitting: Family Medicine

## 2013-09-06 NOTE — Telephone Encounter (Signed)
Pt called after hour line about having increased nausea and diarrhea after starting celexa last night.  Has never taken an SSRI  Before and was wondering if this was normal.  Explained SE of the medication and what she can expect going forward, that this may last for about one week but if continues to be unbearable, she can stop and eventually try on something else.  Pt voices understanding and no further questions.  Tamela Oddi Awanda Mink, DO of [facility] 09/06/2013, 8:13 AM

## 2013-09-11 ENCOUNTER — Other Ambulatory Visit: Payer: Managed Care, Other (non HMO)

## 2013-09-11 DIAGNOSIS — Z862 Personal history of diseases of the blood and blood-forming organs and certain disorders involving the immune mechanism: Secondary | ICD-10-CM

## 2013-09-11 DIAGNOSIS — I10 Essential (primary) hypertension: Secondary | ICD-10-CM

## 2013-09-11 DIAGNOSIS — Z13220 Encounter for screening for lipoid disorders: Secondary | ICD-10-CM

## 2013-09-11 DIAGNOSIS — R002 Palpitations: Secondary | ICD-10-CM

## 2013-09-11 LAB — CBC
HCT: 37 % (ref 36.0–46.0)
Hemoglobin: 12.8 g/dL (ref 12.0–15.0)
MCH: 30.8 pg (ref 26.0–34.0)
MCHC: 34.6 g/dL (ref 30.0–36.0)
MCV: 89.2 fL (ref 78.0–100.0)
PLATELETS: 409 10*3/uL — AB (ref 150–400)
RBC: 4.15 MIL/uL (ref 3.87–5.11)
RDW: 13.6 % (ref 11.5–15.5)
WBC: 7.6 10*3/uL (ref 4.0–10.5)

## 2013-09-11 NOTE — Progress Notes (Signed)
CMP,CBC,FLP AND TSH DONE TODAY Lizett Chowning 

## 2013-09-12 ENCOUNTER — Encounter: Payer: Self-pay | Admitting: Family Medicine

## 2013-09-12 LAB — COMPLETE METABOLIC PANEL WITH GFR
ALBUMIN: 4 g/dL (ref 3.5–5.2)
ALK PHOS: 67 U/L (ref 39–117)
ALT: 13 U/L (ref 0–35)
AST: 16 U/L (ref 0–37)
BUN: 15 mg/dL (ref 6–23)
CO2: 29 mEq/L (ref 19–32)
Calcium: 8.9 mg/dL (ref 8.4–10.5)
Chloride: 105 mEq/L (ref 96–112)
Creat: 0.62 mg/dL (ref 0.50–1.10)
GFR, Est African American: >89 mL/min
GLUCOSE: 88 mg/dL (ref 70–99)
POTASSIUM: 3.9 meq/L (ref 3.5–5.3)
SODIUM: 139 meq/L (ref 135–145)
TOTAL PROTEIN: 6.8 g/dL (ref 6.0–8.3)
Total Bilirubin: 0.2 mg/dL (ref 0.2–1.2)

## 2013-09-12 LAB — TSH: TSH: 1.876 u[IU]/mL (ref 0.350–4.500)

## 2013-09-12 LAB — LIPID PANEL
Cholesterol: 161 mg/dL (ref 0–200)
HDL: 49 mg/dL (ref >39)
LDL CALC: 82 mg/dL (ref 0–99)
Total CHOL/HDL Ratio: 3.3 Ratio
Triglycerides: 150 mg/dL — ABNORMAL HIGH (ref <150)
VLDL: 30 mg/dL (ref 0–40)

## 2013-10-03 ENCOUNTER — Other Ambulatory Visit: Payer: Self-pay | Admitting: Family Medicine

## 2013-10-28 ENCOUNTER — Telehealth: Payer: Self-pay | Admitting: Family Medicine

## 2013-10-28 MED ORDER — CITALOPRAM HYDROBROMIDE 20 MG PO TABS: ORAL_TABLET | ORAL | Status: DC

## 2013-10-28 NOTE — Telephone Encounter (Signed)
Left message on voicemail. Wanda Williams S  

## 2013-10-28 NOTE — Telephone Encounter (Signed)
Pt called and needs a refill on her Celexa. She said that if she needs an appointment to call her and also call her when the medication is called in. jw

## 2013-10-28 NOTE — Telephone Encounter (Signed)
Covering for Dr. Lacinda Axon  I sent in a refill, but pt must schedule an appointment for further refills on this medication. She was last seen in early July and was supposed to follow up in 6 weeks. Please inform pt.  Leeanne Rio, MD

## 2013-11-27 ENCOUNTER — Encounter: Payer: Self-pay | Admitting: Family Medicine

## 2013-11-27 ENCOUNTER — Ambulatory Visit (INDEPENDENT_AMBULATORY_CARE_PROVIDER_SITE_OTHER): Payer: Managed Care, Other (non HMO) | Admitting: Family Medicine

## 2013-11-27 VITALS — BP 136/79 | HR 80 | Temp 98.4°F | Ht <= 58 in | Wt 165.3 lb

## 2013-11-27 DIAGNOSIS — R05 Cough: Secondary | ICD-10-CM

## 2013-11-27 DIAGNOSIS — T465X5A Adverse effect of other antihypertensive drugs, initial encounter: Secondary | ICD-10-CM

## 2013-11-27 DIAGNOSIS — T464X5A Adverse effect of angiotensin-converting-enzyme inhibitors, initial encounter: Secondary | ICD-10-CM | POA: Insufficient documentation

## 2013-11-27 DIAGNOSIS — Z23 Encounter for immunization: Secondary | ICD-10-CM

## 2013-11-27 DIAGNOSIS — F341 Dysthymic disorder: Secondary | ICD-10-CM

## 2013-11-27 DIAGNOSIS — R059 Cough, unspecified: Secondary | ICD-10-CM

## 2013-11-27 DIAGNOSIS — F418 Other specified anxiety disorders: Secondary | ICD-10-CM

## 2013-11-27 MED ORDER — BUPROPION HCL ER (SR) 150 MG PO TB12: 150.0000 mg | ORAL_TABLET | Freq: Every day | ORAL | Status: DC

## 2013-11-27 MED ORDER — VALSARTAN 80 MG PO TABS: 80.0000 mg | ORAL_TABLET | Freq: Every day | ORAL | Status: DC

## 2013-11-27 NOTE — Patient Instructions (Signed)
I have added on an additional drug.    I have also switched you to another BP medication.  Follow up in 6 weeks.

## 2013-11-27 NOTE — Progress Notes (Signed)
   Subjective:    Patient ID: Wanda Williams, female    DOB: February 07, 1971, 43 y.o.   MRN: [id number]  HPI 43 year old female with HTN and Depression/anxiety presents for follow up.  1) Depression/anxiety - Patient reports that she is doing much better. - Mood is better and anxiety has also dramatically improved following treatment w/ Celexa. - She has been experiencing some decreased libido since treatment.  She is concerned about this and would like to discuss treatment options.  2) Cough - Patient has recently been experiencing dry cough. - No other associated symptoms. - No fever, chills, congestion, runny nose.  Review of Systems Per HPI    Objective:   Physical Exam Filed Vitals:   11/27/13 1608  BP: 136/79  Pulse: 80  Temp: 98.4 F (36.9 C)  General: well appearing female in NAD.  Psych: pleasant, normal affect.    Assessment & Plan:  See Problem List

## 2013-11-27 NOTE — Assessment & Plan Note (Signed)
Much improvement with Celexa.  Given low libido, will add Bupropion and re-evaluate in 6 weeks.

## 2013-11-27 NOTE — Assessment & Plan Note (Signed)
Patient cough likely secondary to ACEI. Will switch to ARB and monitor for improvement.

## 2013-11-29 ENCOUNTER — Other Ambulatory Visit: Payer: Self-pay | Admitting: Family Medicine

## 2013-12-24 ENCOUNTER — Other Ambulatory Visit: Payer: Self-pay | Admitting: *Deleted

## 2013-12-25 MED ORDER — CITALOPRAM HYDROBROMIDE 20 MG PO TABS: ORAL_TABLET | ORAL | Status: DC

## 2014-01-02 ENCOUNTER — Ambulatory Visit (INDEPENDENT_AMBULATORY_CARE_PROVIDER_SITE_OTHER): Payer: Managed Care, Other (non HMO) | Admitting: Family Medicine

## 2014-01-02 ENCOUNTER — Encounter: Payer: Self-pay | Admitting: Family Medicine

## 2014-01-02 VITALS — BP 133/79 | HR 79 | Temp 98.1°F | Ht <= 58 in | Wt 167.6 lb

## 2014-01-02 DIAGNOSIS — F418 Other specified anxiety disorders: Secondary | ICD-10-CM

## 2014-01-02 MED ORDER — CITALOPRAM HYDROBROMIDE 10 MG PO TABS: 10.0000 mg | ORAL_TABLET | Freq: Every day | ORAL | Status: DC

## 2014-01-02 MED ORDER — BUPROPION HCL ER (SR) 150 MG PO TB12: 150.0000 mg | ORAL_TABLET | Freq: Every day | ORAL | Status: DC

## 2014-01-02 NOTE — Progress Notes (Signed)
   Subjective:    Patient ID: Wanda Williams, female    DOB: [birth year], 43 y.o.   MRN: [id number]  HPI 43 year old female with depression/anxiety and HTN presents for follow up of depression/anxiety.  1) Depression/anxiety  Patient reports that she is doing well.  Her symptoms are well controlled on Celexa 20 mg daily.  Work is going well and she finds that she is not overwhelmed.  She is continuing to see a therapist who feels like she is doing well.   At her last visit, Wellbutrin was added due to low libido.  She reports that this has persisted and that she has not noted any improvement since addition of Wellbutrin.    Review of Systems Per HPI    Objective:   Physical Exam Filed Vitals:   01/02/14 1607  BP: 133/79  Pulse: 79  Temp: 98.1 F (36.7 C)   Exam: General: well appearing, NAD. Cardiovascular: RRR. No murmurs, rubs, or gallops. Respiratory: CTAB. No rales, rhonchi, or wheeze. Abdomen: soft, nontender, nondistended. Psych: pleasant, smiling, very interactive/conversant.  Normal mood and affect.     Assessment & Plan:  See Problem List

## 2014-01-02 NOTE — Patient Instructions (Signed)
It was nice to see you today.  Decrease Celexa to 10 mg daily.  If you feel were worse in the next week, increase to 20 mg daily (2 tablets).  Continue taking Wellbutrin as prescribed.  Call the office in 1-2 weeks and let me know how you're doing (I'll do my best to remember to call).  Take care,  Dr. Lacinda Axon

## 2014-01-02 NOTE — Assessment & Plan Note (Signed)
Patient has drastically improved following treatment but continues to experience low libido/intimacy issues. Will decrease Celexa to 10 mg in hopes that this will improve this. Patient to continue Wellbutrin. If no improvement with this will increase Wellbutrin to BID.

## 2014-01-10 ENCOUNTER — Other Ambulatory Visit: Payer: Self-pay

## 2014-01-10 DIAGNOSIS — Z1231 Encounter for screening mammogram for malignant neoplasm of breast: Secondary | ICD-10-CM

## 2014-01-14 ENCOUNTER — Ambulatory Visit (INDEPENDENT_AMBULATORY_CARE_PROVIDER_SITE_OTHER): Payer: Managed Care, Other (non HMO) | Admitting: Family Medicine

## 2014-01-14 ENCOUNTER — Encounter: Payer: Self-pay | Admitting: Family Medicine

## 2014-01-14 VITALS — BP 130/85 | HR 94 | Temp 98.2°F | Wt 165.0 lb

## 2014-01-14 DIAGNOSIS — J020 Streptococcal pharyngitis: Secondary | ICD-10-CM

## 2014-01-14 DIAGNOSIS — H9203 Otalgia, bilateral: Secondary | ICD-10-CM

## 2014-01-14 DIAGNOSIS — M545 Low back pain: Secondary | ICD-10-CM

## 2014-01-14 DIAGNOSIS — J029 Acute pharyngitis, unspecified: Secondary | ICD-10-CM

## 2014-01-14 DIAGNOSIS — M549 Dorsalgia, unspecified: Secondary | ICD-10-CM | POA: Insufficient documentation

## 2014-01-14 LAB — POCT RAPID STREP A (OFFICE): Rapid Strep A Screen: POSITIVE — AB

## 2014-01-14 MED ORDER — PENICILLIN V POTASSIUM 500 MG PO TABS: 500.0000 mg | ORAL_TABLET | Freq: Two times a day (BID) | ORAL | Status: DC

## 2014-01-14 NOTE — Patient Instructions (Signed)

## 2014-01-14 NOTE — Progress Notes (Signed)
Subjective:     Patient ID: Wanda Williams, female   DOB: [birth year], 43 y.o.   MRN: [id number]  Sore Throat  This is a new problem. The current episode started yesterday. The problem has been gradually improving. The pain is worse on the right (Both side) side. The maximum temperature recorded prior to her arrival was 101 - 101.9 F (Ysterday night had fever). The pain is at a severity of 9/10. The pain is moderate. Associated symptoms include ear pain and trouble swallowing. Pertinent negatives include no coughing, diarrhea, headaches, hoarse voice, shortness of breath or vomiting. She has had no exposure to strep. She has tried NSAIDs (Chloroseptic) for the symptoms. The treatment provided mild relief.  Otalgia  There is pain in both ears. This is a new problem. The current episode started yesterday. The problem has been gradually improving. The maximum temperature recorded prior to her arrival was 101 - 101.9 F. The pain is at a severity of 5/10. The pain is mild. Pertinent negatives include no coughing, diarrhea, headaches or vomiting.  Back pain: C/O BP for 2 days, about 5/10 in severity, associated with left shoulder pain. She denies any trauma or injury, pain improves some today.  Current Outpatient Prescriptions on File Prior to Visit  Medication Sig Dispense Refill  . buPROPion (WELLBUTRIN SR) 150 MG 12 hr tablet Take 1 tablet (150 mg total) by mouth daily. 90 tablet 1  . citalopram (CELEXA) 10 MG tablet Take 1 tablet (10 mg total) by mouth daily. 90 tablet 1  . valsartan (DIOVAN) 80 MG tablet Take 1 tablet (80 mg total) by mouth daily. 30 tablet 6  . hydrochlorothiazide (HYDRODIURIL) 25 MG tablet Take 0.5 tablets (12.5 mg total) by mouth daily. 60 tablet 3   No current facility-administered medications on file prior to visit.   History reviewed. No pertinent past medical history.   Review of Systems  HENT: Positive for ear pain and trouble swallowing. Negative for hoarse voice.    Respiratory: Negative for cough and shortness of breath.   Cardiovascular: Negative.   Gastrointestinal: Negative.  Negative for vomiting and diarrhea.  Musculoskeletal: Positive for back pain.  Neurological: Negative for headaches.  All other systems reviewed and are negative.  Filed Vitals:   01/14/14 0910  BP: 130/85  Pulse: 94  Temp: 98.2 F (36.8 C)  TempSrc: Oral  Weight: 165 lb (74.844 kg)       Objective:   Physical Exam  Constitutional: She appears well-developed. No distress.  HENT:  Right Ear: Tympanic membrane, external ear and ear canal normal.  Left Ear: Tympanic membrane, external ear and ear canal normal.  Mouth/Throat: Mucous membranes are normal. Posterior oropharyngeal erythema present. No oropharyngeal exudate or tonsillar abscesses.  Cardiovascular: Normal rate, regular rhythm, normal heart sounds and intact distal pulses.   No murmur heard. Pulmonary/Chest: Effort normal and breath sounds normal. No respiratory distress. She has no wheezes.  Abdominal: Soft. Bowel sounds are normal. She exhibits no distension and no mass. There is no tenderness. There is no guarding.  Musculoskeletal:       Right shoulder: Normal.       Left shoulder: Normal.       Lumbar back: Normal.  Lymphadenopathy:    She has cervical adenopathy.       Right cervical: Posterior cervical adenopathy present.  Neurological: She is alert.  Nursing note and vitals reviewed.      Assessment:     Pharyngitis Otalgia Back pain  Plan:     Check problem list.

## 2014-01-14 NOTE — Assessment & Plan Note (Signed)
Likely related to her pharyngitis. Ibuprofen prn pain recommended.

## 2014-01-14 NOTE — Assessment & Plan Note (Signed)
Rapid strep test positive. Ibuprofen prn pain recommended. Penicillin V prescribed for 10 days. F/U prn is no improvement.

## 2014-01-14 NOTE — Assessment & Plan Note (Signed)
Likely musculoskeletal pain. Ibuprofen prn pain recommended. F/U with PCP soon if no improvement. Consider imaging then. She agreed with plan.

## 2014-01-22 ENCOUNTER — Other Ambulatory Visit (HOSPITAL_COMMUNITY)
Admission: RE | Admit: 2014-01-22 | Discharge: 2014-01-22 | Disposition: A | Discharge status: Home / Self Care | Payer: Managed Care, Other (non HMO) | Source: Ambulatory Visit | Attending: Family Medicine | Admitting: Family Medicine

## 2014-01-22 ENCOUNTER — Ambulatory Visit (INDEPENDENT_AMBULATORY_CARE_PROVIDER_SITE_OTHER): Payer: Managed Care, Other (non HMO) | Admitting: Family Medicine

## 2014-01-22 ENCOUNTER — Encounter: Payer: Self-pay | Admitting: Family Medicine

## 2014-01-22 VITALS — BP 144/96 | HR 80 | Temp 98.3°F | Ht <= 58 in | Wt 166.0 lb

## 2014-01-22 DIAGNOSIS — F418 Other specified anxiety disorders: Secondary | ICD-10-CM

## 2014-01-22 DIAGNOSIS — Z1151 Encounter for screening for human papillomavirus (HPV): Secondary | ICD-10-CM | POA: Diagnosis present

## 2014-01-22 DIAGNOSIS — Z0000 Encounter for general adult medical examination without abnormal findings: Secondary | ICD-10-CM | POA: Insufficient documentation

## 2014-01-22 DIAGNOSIS — Z124 Encounter for screening for malignant neoplasm of cervix: Secondary | ICD-10-CM

## 2014-01-22 DIAGNOSIS — Z01419 Encounter for gynecological examination (general) (routine) without abnormal findings: Secondary | ICD-10-CM | POA: Diagnosis not present

## 2014-01-22 DIAGNOSIS — I10 Essential (primary) hypertension: Secondary | ICD-10-CM

## 2014-01-22 MED ORDER — BUPROPION HCL ER (SR) 150 MG PO TB12: 150.0000 mg | ORAL_TABLET | Freq: Two times a day (BID) | ORAL | Status: DC

## 2014-01-22 NOTE — Progress Notes (Deleted)
   Subjective:    Patient ID: Wanda Williams, female    DOB: Mar 28, 1970, 43 y.o.   MRN: [id number]  HPI    Review of Systems     Objective:   Physical Exam        Assessment & Plan:

## 2014-01-22 NOTE — Patient Instructions (Signed)
It was nice to see you today.  Increase your Wellbutrin to twice daily.  Be sure to get your mammogram.  Follow up in ~ 6 months or earlier if needed.  Take care  Dr. Lacinda Axon

## 2014-01-22 NOTE — Assessment & Plan Note (Addendum)
Pap smear performed today. Patient already received influenza vaccine. She has had a recent lipid panel. Lipid Panel     Component Value Date/Time   CHOL 161 09/11/2013 1443   TRIG 150* 09/11/2013 1443   HDL 49 09/11/2013 1443   CHOLHDL 3.3 09/11/2013 1443   VLDL 30 09/11/2013 1443   LDLCALC 82 09/11/2013 1443

## 2014-01-22 NOTE — Progress Notes (Signed)
   Subjective:   Wanda Williams is a 43 y.o. female with a PMH of HTN and Depression/anxiety here for an annual gynecological exam/Wellness visit.  1) HTN  Home BP Monitoring - No  Medications - Valsartan, HCTZ.  Compliance -  Yes.  ROS: Denies chest pain, SOB, lightheadedness/dizziness   2) Anxiety/Depression  Patient is much improved with Celexa and is doing well.  However, she continues to have difficulty with low libido.  3) Gyn concerns/Preventative healthcare  Last menstrual period: 01/04/2014   Regular periods: Patient reports cycles every couple of months.  Heavy bleeding: no  Sexually active: yes; monogamous.  Contraception or hormonal therapy: No.  Hot flashes: Occasional  Vaginal discharge: No.  Dysuria:No.  Last mammogram: Not in epic. Has upcoming mammogram this month.  Breast mass or concerns: No.  Last pap smear: 2012 (Negative).  PMH, Surgical Hx, Family Hx, Social History reviewed and updated as below.  Past Medical History  Diagnosis Date  . Depression   . Anxiety   . HTN (hypertension)      Past Surgical History  Procedure Laterality Date  . Cesarean section    . Thyroid cyst excision     Family History  Problem Relation Age of Onset  . Heart disease Father    History   Social History  . Marital Status: Married    Spouse Name: N/A    Number of Children: N/A  . Years of Education: N/A   Social History Main Topics  . Smoking status: Never Smoker   . Smokeless tobacco: None  . Alcohol Use: Yes     Comment: rare  . Drug Use: No  . Sexual Activity: Yes   Other Topics Concern  . None   Social History Narrative   Review of Systems:  Per HPI. Otherwise a complete 10 point ROS was negative.   Objective:   Filed Vitals:   01/22/14 1600  BP: 144/96  Pulse: 80  Temp: 98.3 F (36.8 C)   Exam: General: well appearing, NAD. HEENT: NCAT.  Left TM normal. Right TM dull; no erythema.  Oropharynx clear.    Cardiovascular: RRR. No murmurs, rubs, or gallops. Respiratory: CTAB. No rales, rhonchi, or wheeze. Abdomen: soft, nontender, nondistended. Extremities: warm, well perfused. No LE edema. Skin: Warm, dry, intact. Pelvic Exam:        External: normal female genitalia without lesions or masses        Vagina: normal without lesions or masses        Cervix: normal without lesions or masses        Pap smear: performed Breasts: breasts appear normal, no appreciable masses, no nipple changes or axillary nodes.   Assessment/Plan:  See Problem List

## 2014-01-22 NOTE — Assessment & Plan Note (Signed)
BP elevated today. Patient to check BP's at home and report back. Will not make changes at this time. If elevated at follow up will increase Valsartan.

## 2014-01-22 NOTE — Progress Notes (Deleted)
   Subjective:    Patient ID: Wanda Williams, female    DOB: 13-Apr-1970, 43 y.o.   MRN: [id number]  HPI 43 year old female with HTN and depression/anxiety presents for her annual physical exam/wellness visit.     Review of Systems     Objective:   Physical Exam        Assessment & Plan:

## 2014-01-22 NOTE — Assessment & Plan Note (Signed)
Doing well but libido issues continue. No improvement with decrease in Celexa. Will increase Wellbutrin to BID and monitor for improvement.  If no success with this may have to d/c celexa and try different SSRI.

## 2014-01-24 LAB — CYTOLOGY - PAP

## 2014-01-27 ENCOUNTER — Ambulatory Visit
Admission: RE | Admit: 2014-01-27 | Discharge: 2014-01-27 | Disposition: A | Discharge status: Home / Self Care | Payer: Managed Care, Other (non HMO) | Source: Ambulatory Visit

## 2014-01-27 DIAGNOSIS — Z1231 Encounter for screening mammogram for malignant neoplasm of breast: Secondary | ICD-10-CM

## 2014-01-28 ENCOUNTER — Encounter: Payer: Self-pay | Admitting: Family Medicine

## 2014-03-28 ENCOUNTER — Other Ambulatory Visit: Payer: Self-pay | Admitting: Family Medicine

## 2014-04-25 ENCOUNTER — Telehealth: Payer: Self-pay | Admitting: Family Medicine

## 2014-04-25 NOTE — Telephone Encounter (Signed)
Spoke with husband who called on wife's behalf. He informed me that she's been expressing worsening mood symptoms. She has recently stopped taking her Celexa and has been intermittently taking it recently. He states that she did this to help accommodate sexual side effects. I advised him that she should go back on her medication as soon as possible and take it as prescribed. If she does not become comfortable this that is okay but she needs to see me either way early next week.

## 2014-04-25 NOTE — Telephone Encounter (Signed)
Husband says pt has been messing with how she is taking her medicine.  She is having a lot of side effects. Wants to know what to do Will explain it all when he talks to the dr

## 2014-04-30 ENCOUNTER — Ambulatory Visit: Payer: Managed Care, Other (non HMO) | Admitting: Family Medicine

## 2014-05-06 ENCOUNTER — Encounter: Payer: Self-pay | Admitting: Family Medicine

## 2014-05-06 ENCOUNTER — Ambulatory Visit (INDEPENDENT_AMBULATORY_CARE_PROVIDER_SITE_OTHER): Payer: Managed Care, Other (non HMO) | Admitting: Family Medicine

## 2014-05-06 VITALS — BP 145/91 | HR 91 | Temp 97.8°F | Ht <= 58 in | Wt 177.9 lb

## 2014-05-06 DIAGNOSIS — F418 Other specified anxiety disorders: Secondary | ICD-10-CM

## 2014-05-06 MED ORDER — VENLAFAXINE HCL 37.5 MG PO TABS: ORAL_TABLET | ORAL | Status: DC

## 2014-05-06 NOTE — Assessment & Plan Note (Signed)
After lengthy discussion with patient today, will stop celexa. Tapering Wellbutrin and starting Effexor. Follow up in ~ 2 weeks.

## 2014-05-06 NOTE — Progress Notes (Signed)
   Subjective:    Patient ID: Wanda Williams, female    DOB: March 17, 1970, 44 y.o.   MRN: [id number]  HPI 44 year old female with depression/anxiety presents for follow up.  1) Depression/Anxiety  A few weeks ago patient self adjusted her medications (Celexa and Wellbutrin). She did this in hopes of combating the sexual dysfunction that she has been experiencing (decreased libido).  While self adjusting these medications she began to experience more anxiety and depressive symptoms.  Her husband called on her behalf and I informed her to take her medications as prescribed.  She presents today for follow up.  She states that she is having a difficult time.  She has been taking her medications as prescribed for the past week.  She would like to discuss other treatment options today as she is struggling with her mood as well as continued low libido.   Review of Systems Per HPI    Objective:   Physical Exam Filed Vitals:   05/06/14 1505  BP: 145/91  Pulse: 91  Temp: 97.8 F (36.6 C)   General: NAD. Psych: tearful during history today.  Very anxious with depressed affect.  PHQ-9: 14. GAD-7: 8. Mood disorder: 5.     Assessment & Plan:  See Problem List

## 2014-05-06 NOTE — Patient Instructions (Signed)
Stop the celexa.  Decrease the Wellbutrin to 150 mg (1 tablet daily) x 1 week then stop.  Start Effexor.  Follow up in ~ 2 weeks.  Take care  Dr. Lacinda Axon

## 2014-05-23 ENCOUNTER — Encounter: Payer: Self-pay | Admitting: Family Medicine

## 2014-05-23 ENCOUNTER — Ambulatory Visit (INDEPENDENT_AMBULATORY_CARE_PROVIDER_SITE_OTHER): Payer: Managed Care, Other (non HMO) | Admitting: Family Medicine

## 2014-05-23 VITALS — BP 147/90 | HR 80 | Temp 98.2°F | Ht <= 58 in | Wt 179.9 lb

## 2014-05-23 DIAGNOSIS — I10 Essential (primary) hypertension: Secondary | ICD-10-CM

## 2014-05-23 DIAGNOSIS — F418 Other specified anxiety disorders: Secondary | ICD-10-CM

## 2014-05-23 MED ORDER — VALSARTAN 160 MG PO TABS: 160.0000 mg | ORAL_TABLET | Freq: Every day | ORAL | Status: DC

## 2014-05-23 NOTE — Assessment & Plan Note (Signed)
Doing well on Effexor. Will continue.

## 2014-05-23 NOTE — Assessment & Plan Note (Addendum)
Elevated on several occasions today. Increasing Diovan.

## 2014-05-23 NOTE — Progress Notes (Signed)
   Subjective:    Patient ID: Wanda Williams, female    DOB: 28-Mar-1970, 44 y.o.   MRN: [id number]  HPI 44 year old female presents for follow up regarding depression/anxiety.  1) Depression/Anxiety  At last visit on 3/1 Celexa was stopped and a Wellbutrin taper was recommended.  Additionally, Effexor was started.  Patient presents today for follow up.  She states that she is doing well.  Her anxiety and depression are improved on the Effexor. She reports a 50-60% improvement.  She states her mood is better and home and work life are good.  Additionally, she reports some improvement in prior decreased sex drive (thought to be a side effect of the medications).   Review of Systems Per HPI    Objective:   Physical Exam Filed Vitals:   05/23/14 1651  BP: 147/90  Pulse:   Temp:    Exam: General: well appearing, NAD. Cardiovascular: RRR. No murmurs, rubs, or gallops. Respiratory: CTAB. No rales, rhonchi, or wheeze. Psych: slightly depressed mood.  PHQ - 9: 4. GAD-7: 2    Assessment & Plan:  See Problem List

## 2014-05-23 NOTE — Patient Instructions (Signed)
It was nice to see you today.  Continue your effexor as prescribed.  Follow up in ~ 3 months.  Call me with your BP readings in 2 weeks to 1 month.  Take care  Dr. Lacinda Axon

## 2014-06-01 ENCOUNTER — Other Ambulatory Visit: Payer: Self-pay | Admitting: Family Medicine

## 2014-06-02 NOTE — Telephone Encounter (Signed)
LMOVM for pt to return call. She should have one refill left at pharmacy. Wanda Williams, [name]

## 2014-06-02 NOTE — Telephone Encounter (Signed)
Please call patient, she has a refill on this medication already by Dr. Lacinda Axon, by the medication records. She should be encouraged to make off all up appointment as soon as possible as he wanted to see her in 2-4 weeks from her appointment. Thank you

## 2014-06-30 ENCOUNTER — Other Ambulatory Visit: Payer: Self-pay | Admitting: Family Medicine

## 2014-07-02 ENCOUNTER — Other Ambulatory Visit: Payer: Self-pay | Admitting: Family Medicine

## 2014-07-06 ENCOUNTER — Other Ambulatory Visit: Payer: Self-pay | Admitting: Family Medicine

## 2014-07-22 ENCOUNTER — Encounter: Payer: Self-pay | Admitting: Family Medicine

## 2014-07-22 ENCOUNTER — Ambulatory Visit (INDEPENDENT_AMBULATORY_CARE_PROVIDER_SITE_OTHER): Payer: Managed Care, Other (non HMO) | Admitting: Family Medicine

## 2014-07-22 VITALS — BP 138/82 | HR 75 | Temp 98.2°F | Ht <= 58 in | Wt 182.0 lb

## 2014-07-22 DIAGNOSIS — L538 Other specified erythematous conditions: Secondary | ICD-10-CM | POA: Insufficient documentation

## 2014-07-22 DIAGNOSIS — I10 Essential (primary) hypertension: Secondary | ICD-10-CM | POA: Diagnosis not present

## 2014-07-22 MED ORDER — OLOPATADINE HCL 0.2 % OP SOLN: OPHTHALMIC | Status: DC

## 2014-07-22 MED ORDER — PREDNISONE 10 MG PO TABS: ORAL_TABLET | ORAL | Status: DC

## 2014-07-22 NOTE — Assessment & Plan Note (Signed)
New problem. Unclear etiology but likely secondary to contact/irritant. Recommend treatment with OTC Benadryl.  I also started patient on Pataday for associated eye dryness/irritation. Advised use of mild emollients. Prescription for Prednisone given to be filled if she fails to improve with above in next few days.

## 2014-07-22 NOTE — Patient Instructions (Signed)
It was nice to see you today.  Use the eye drop and an antihistamine for the next few days.   If no improvement you can fill the prednisone.  Continue your BP medication as prescribed.  Take care  Dr. Lacinda Axon

## 2014-07-22 NOTE — Assessment & Plan Note (Signed)
Rechecked today and was at goal. I advised her to continue her current therapy (Diovan once daily).  I also advised her to monitor BP at home and return if she finds that her BP remains elevated above goal (140/90).

## 2014-07-22 NOTE — Progress Notes (Signed)
   Subjective:    Patient ID: Wanda Williams, female    DOB: August 26, 1970, 44 y.o.   MRN: [id number]  HPI 44 year old female presents to the clinic today with complaints of dryness/itchyness and redness around her eyes.  Additionally, she would like to discuss her HTN.  1) Dryness/Itchyness/Redness around eyes  Patient reports a 1 week history of eye swelling, periorbital redness, and associated itchyness.  No new exposures or changes in soaps/detergents/makeup/lotions.  She has been taking Claritin and applying vaseline with little improvement.  No known inciting factor. No exacerbating factors.   Of note, her daughter had the same issue (but more severe) previously (months ago).  She was treated with prednisone with resolution.  2) HTN  Home BP Monitoring - Yes. She has had several readings recently that were elevated (140's - 150's/90's-100)  Medications - Diovan.  Compliance -  Yes; However, recently doubled her medication given elevated BP.  ROS: Denies chest pain, SOB, lightheadedness/dizziness   Social Hx - Nonsmoker.   Review of Systems  Constitutional: Negative for fever.  Respiratory: Negative for shortness of breath.   Cardiovascular: Negative for chest pain.  Psychiatric/Behavioral: The patient is nervous/anxious.       Objective:   Physical Exam Filed Vitals:   07/22/14 1645  BP: 138/82  Pulse:   Temp:    Vital signs reviewed.  Exam: General: anxious appearing female, NAD.  Skin: Orbital and Periorbital erythema and dryness noted bilaterally.  Mild in severity. No warmth. No weeping or drainage.     Assessment & Plan:  See Problem List

## 2014-09-22 ENCOUNTER — Other Ambulatory Visit: Payer: Self-pay | Admitting: *Deleted

## 2014-09-22 MED ORDER — VENLAFAXINE HCL 37.5 MG PO TABS: 37.5000 mg | ORAL_TABLET | Freq: Two times a day (BID) | ORAL | Status: DC

## 2014-11-14 ENCOUNTER — Other Ambulatory Visit: Payer: Self-pay | Admitting: Internal Medicine

## 2015-02-05 ENCOUNTER — Other Ambulatory Visit: Payer: Self-pay | Admitting: Family Medicine

## 2015-03-17 ENCOUNTER — Telehealth: Payer: Self-pay | Admitting: Behavioral Health

## 2015-03-17 ENCOUNTER — Encounter: Payer: Self-pay | Admitting: Behavioral Health

## 2015-03-17 NOTE — Addendum Note (Signed)
Addended by: Kathlen Brunswick on: 03/17/2015 05:32 PM   Modules accepted: Orders, Medications

## 2015-03-17 NOTE — Telephone Encounter (Signed)
Unable to reach patient at time of Pre-Visit Call.  Left message for patient to return call when available.    

## 2015-03-17 NOTE — Telephone Encounter (Signed)
Pre-Visit Call completed with patient and chart updated.   Pre-Visit Info documented in Specialty Comments under [facility].    

## 2015-03-17 NOTE — Telephone Encounter (Signed)
Pt is returning your call.Wanda FlavorsGJ:[id number]

## 2015-03-18 ENCOUNTER — Encounter: Payer: Self-pay | Admitting: Family

## 2015-03-18 ENCOUNTER — Ambulatory Visit: Payer: Managed Care, Other (non HMO) | Admitting: Family

## 2015-03-18 ENCOUNTER — Other Ambulatory Visit: Payer: Managed Care, Other (non HMO)

## 2015-03-18 ENCOUNTER — Ambulatory Visit (INDEPENDENT_AMBULATORY_CARE_PROVIDER_SITE_OTHER): Payer: Managed Care, Other (non HMO) | Admitting: Family

## 2015-03-18 VITALS — BP 128/86 | HR 89 | Temp 98.0°F | Resp 16 | Ht 58.25 in | Wt 187.0 lb

## 2015-03-18 DIAGNOSIS — N912 Amenorrhea, unspecified: Secondary | ICD-10-CM

## 2015-03-18 DIAGNOSIS — R8290 Unspecified abnormal findings in urine: Secondary | ICD-10-CM | POA: Diagnosis not present

## 2015-03-18 DIAGNOSIS — F418 Other specified anxiety disorders: Secondary | ICD-10-CM

## 2015-03-18 DIAGNOSIS — G5600 Carpal tunnel syndrome, unspecified upper limb: Secondary | ICD-10-CM | POA: Insufficient documentation

## 2015-03-18 DIAGNOSIS — Z0000 Encounter for general adult medical examination without abnormal findings: Secondary | ICD-10-CM

## 2015-03-18 DIAGNOSIS — Z23 Encounter for immunization: Secondary | ICD-10-CM | POA: Diagnosis not present

## 2015-03-18 DIAGNOSIS — R6882 Decreased libido: Secondary | ICD-10-CM

## 2015-03-18 DIAGNOSIS — G5603 Carpal tunnel syndrome, bilateral upper limbs: Secondary | ICD-10-CM

## 2015-03-18 LAB — POCT URINALYSIS DIPSTICK
Bilirubin, UA: NEGATIVE
Blood, UA: NEGATIVE
GLUCOSE UA: NEGATIVE
KETONES UA: NEGATIVE
Leukocytes, UA: NEGATIVE
Nitrite, UA: NEGATIVE
Protein, UA: NEGATIVE
Urobilinogen, UA: NEGATIVE
pH, UA: 6

## 2015-03-18 LAB — FOLLICLE STIMULATING HORMONE: FSH: 47.6 m[IU]/mL

## 2015-03-18 LAB — TSH: TSH: 2.69 u[IU]/mL (ref 0.35–4.50)

## 2015-03-18 MED ORDER — BETAMETHASONE DIPROPIONATE 0.05 % EX CREA: TOPICAL_CREAM | Freq: Two times a day (BID) | CUTANEOUS | Status: DC

## 2015-03-18 NOTE — Assessment & Plan Note (Signed)
Advised pt to purchase otc wrist splints and wear while sleeping and as able during the day. Trial of meloxicam.  This may also help with the right elbow and left ankle pain she is experiencing.

## 2015-03-18 NOTE — Assessment & Plan Note (Signed)
Obtain hormonal studies to assess testosterone level (low libido) and amenorrhea (hot flashes, ? Menopausal)

## 2015-03-18 NOTE — Progress Notes (Signed)
Subjective:    Patient ID: Wanda Williams, female    DOB: [birth year], 45 y.o.   MRN: [id number]  HPI  Wanda Williams is a 45 yr old female who presents today to establish care in transfer from our [place] office where she was followed by Dr. Lacinda Axon.    She wishes to discuss low libido.  Felt good on citalopram but this bothered her sex drive.  She reports that she has gained at least 20 pounds in the last 1.5 years.  Depression/anxiety- She is currently on effexor. Reports that she is still being moody.  Reports that she doesn't sleep well due to feeling hot (husband keeps temp at 8).  Some days getting out of bed is an effort.    HTN-  BP Readings from Last 3 Encounters:  03/18/15 128/86  07/22/14 138/82  05/23/14 147/90     Review of Systems  Constitutional: Positive for unexpected weight change.       Reports that she has gained some weight with her depression treatment  HENT: Negative for rhinorrhea.   Respiratory: Negative for cough.   Cardiovascular: Positive for leg swelling.       Occasional LE edema.   Gastrointestinal: Negative for diarrhea and constipation.  Genitourinary: Negative for dysuria, urgency and frequency.       LMP 12/16  Foul smelling urine  Musculoskeletal:       Some right elbow pain  Some hand tingling, ? CTS  Skin:       eczema   Past Medical History  Diagnosis Date  . Depression   . Anxiety   . HTN (hypertension)     Social History   Social History  . Marital Status: Married    Spouse Name: N/A  . Number of Children: N/A  . Years of Education: N/A   Occupational History  . Not on file.   Social History Main Topics  . Smoking status: Never Smoker   . Smokeless tobacco: Not on file  . Alcohol Use: 0.0 oz/week    0 Standard drinks or equivalent per week     Comment: rare  . Drug Use: No  . Sexual Activity: Yes   Other Topics Concern  . Not on file   Social History Narrative   Works at [employer]   One daughter age 75    One daughter is age 93   Enjoys cross stitching, playing games on her cell phone.     2 dogs   Completed 12th grade.     Past Surgical History  Procedure Laterality Date  . Cesarean section    . Thyroid cyst excision    . Dilation and curettage of uterus    . Tubal ligation  2006    Family History  Problem Relation Age of Onset  . Heart disease Father     heart murmur  . Parkinson's disease Father   . Hypertension Mother   . Coronary artery disease Paternal Uncle     2 uncles (both died before 45)  . Coronary artery disease Paternal Aunt     MI, died before 4    Allergies  Allergen Reactions  . Ace Inhibitors Cough    Current Outpatient Prescriptions on File Prior to Visit  Medication Sig Dispense Refill  . valsartan (DIOVAN) 160 MG tablet Take 1 tablet (160 mg total) by mouth daily. 90 tablet 3  . venlafaxine (EFFEXOR) 37.5 MG tablet TAKE 1 TABLET (37.5 MG TOTAL) BY MOUTH 2 (TWO)  TIMES DAILY WITH A MEAL. 60 tablet 3   No current facility-administered medications on file prior to visit.    BP 128/86 mmHg  Pulse 89  Temp(Src) 98 F (36.7 C) (Oral)  Resp 16  Ht 4' 10.25" (1.48 m)  Wt 187 lb (84.823 kg)  BMI 38.72 kg/m2  SpO2 98%  LMP 08/17/2014       Objective:   Physical Exam  Constitutional: She is oriented to person, place, and time. She appears well-developed and well-nourished.  HENT:  Head: Normocephalic and atraumatic.  Right Ear: Tympanic membrane and ear canal normal.  Left Ear: Tympanic membrane and ear canal normal.  Mouth/Throat: No oropharyngeal exudate, posterior oropharyngeal edema or posterior oropharyngeal erythema.  Eyes: No scleral icterus.  Neck: Neck supple. No thyromegaly present.  Cardiovascular: Normal rate, regular rhythm and normal heart sounds.   No murmur heard. Pulmonary/Chest: Effort normal and breath sounds normal. No respiratory distress. She has no wheezes.  Musculoskeletal:  No swelling of right elbow or left  ankle.   Lymphadenopathy:    She has no cervical adenopathy.  Neurological: She is alert and oriented to person, place, and time.  Skin: Skin is warm and dry.  Psychiatric: She has a normal mood and affect. Her behavior is normal. Judgment and thought content normal.          Assessment & Plan:

## 2015-03-18 NOTE — Assessment & Plan Note (Signed)
Fair depression control. I think she could benefit from working with a therapist due to some family stressors.  Continue effexor for now. Could consider d/c effexor and change to Vybriid next visit if no improvement.

## 2015-03-18 NOTE — Progress Notes (Signed)
Pre visit review using our clinic review tool, if applicable. No additional management support is needed unless otherwise documented below in the visit note.,  

## 2015-03-18 NOTE — Patient Instructions (Signed)
Please complete lab work prior to leaving. Please contact [facility] to arrange an appointment with one of our counselors.[phone]

## 2015-03-19 ENCOUNTER — Other Ambulatory Visit: Payer: Self-pay | Admitting: Family

## 2015-03-19 ENCOUNTER — Telehealth: Payer: Self-pay | Admitting: Family

## 2015-03-19 LAB — LUTEINIZING HORMONE: LH: 22.5 m[IU]/mL

## 2015-03-19 LAB — HCG, SERUM, QUALITATIVE: Preg, Serum: NEGATIVE

## 2015-03-19 MED ORDER — VENLAFAXINE HCL 37.5 MG PO TABS: ORAL_TABLET | ORAL | Status: DC

## 2015-03-19 MED ORDER — VALSARTAN 160 MG PO TABS: 160.0000 mg | ORAL_TABLET | Freq: Every day | ORAL | Status: DC

## 2015-03-19 MED ORDER — NITROFURANTOIN MONOHYD MACRO 100 MG PO CAPS: 100.0000 mg | ORAL_CAPSULE | Freq: Two times a day (BID) | ORAL | Status: DC

## 2015-03-19 NOTE — Telephone Encounter (Signed)
Urine is growing bacteria. Start macrodantin.

## 2015-03-19 NOTE — Telephone Encounter (Signed)
Called pt to inform her of urine cx results and to start atbx. Pt voiced understanding and also asked for refills on valsartan and venlaxafine to last until her f/u w/ Melissa in Feb. Meds filled to pharmacy as requested.

## 2015-03-20 LAB — URINE CULTURE: Colony Count: >=100000

## 2015-03-21 LAB — TESTOSTERONE, TOTAL, LC/MS/MS: Testosterone, Total, LC-MS-MS: 17 ng/dL (ref 2–45)

## 2015-03-21 LAB — ESTROGENS, TOTAL: ESTROGEN: 125.8 pg/mL

## 2015-03-23 ENCOUNTER — Encounter: Payer: Self-pay | Admitting: Family

## 2015-04-02 ENCOUNTER — Ambulatory Visit
Admission: RE | Admit: 2015-04-02 | Discharge: 2015-04-02 | Disposition: A | Discharge status: Home / Self Care | Payer: Managed Care, Other (non HMO) | Source: Ambulatory Visit | Attending: Family | Admitting: Family

## 2015-04-02 DIAGNOSIS — Z0000 Encounter for general adult medical examination without abnormal findings: Secondary | ICD-10-CM

## 2015-04-06 ENCOUNTER — Other Ambulatory Visit: Payer: Self-pay | Admitting: Family

## 2015-04-06 DIAGNOSIS — R928 Other abnormal and inconclusive findings on diagnostic imaging of breast: Secondary | ICD-10-CM

## 2015-04-10 ENCOUNTER — Ambulatory Visit
Admission: RE | Admit: 2015-04-10 | Discharge: 2015-04-10 | Disposition: A | Discharge status: Home / Self Care | Payer: Managed Care, Other (non HMO) | Source: Ambulatory Visit | Attending: Family | Admitting: Family

## 2015-04-10 DIAGNOSIS — R928 Other abnormal and inconclusive findings on diagnostic imaging of breast: Secondary | ICD-10-CM

## 2015-04-29 ENCOUNTER — Ambulatory Visit (INDEPENDENT_AMBULATORY_CARE_PROVIDER_SITE_OTHER): Payer: Managed Care, Other (non HMO) | Admitting: Family

## 2015-04-29 ENCOUNTER — Encounter: Payer: Self-pay | Admitting: Family

## 2015-04-29 VITALS — BP 144/92 | HR 75 | Temp 97.9°F | Resp 16 | Ht 58.25 in | Wt 187.0 lb

## 2015-04-29 DIAGNOSIS — R8290 Unspecified abnormal findings in urine: Secondary | ICD-10-CM

## 2015-04-29 DIAGNOSIS — F418 Other specified anxiety disorders: Secondary | ICD-10-CM

## 2015-04-29 DIAGNOSIS — I10 Essential (primary) hypertension: Secondary | ICD-10-CM | POA: Diagnosis not present

## 2015-04-29 DIAGNOSIS — G5603 Carpal tunnel syndrome, bilateral upper limbs: Secondary | ICD-10-CM | POA: Diagnosis not present

## 2015-04-29 LAB — POCT URINALYSIS DIPSTICK
Bilirubin, UA: NEGATIVE
Glucose, UA: NEGATIVE
KETONES UA: NEGATIVE
Leukocytes, UA: NEGATIVE
Nitrite, UA: POSITIVE
Protein, UA: NEGATIVE
RBC UA: NEGATIVE
SPEC GRAV UA: 1.02
Urobilinogen, UA: 0.2
pH, UA: 7

## 2015-04-29 MED ORDER — VENLAFAXINE HCL ER 150 MG PO CP24: 150.0000 mg | ORAL_CAPSULE | Freq: Every day | ORAL | Status: DC

## 2015-04-29 MED ORDER — NITROFURANTOIN MONOHYD MACRO 100 MG PO CAPS: 100.0000 mg | ORAL_CAPSULE | Freq: Two times a day (BID) | ORAL | Status: DC

## 2015-04-29 MED ORDER — VALSARTAN 160 MG PO TABS: 160.0000 mg | ORAL_TABLET | Freq: Every day | ORAL | Status: DC

## 2015-04-29 NOTE — Assessment & Plan Note (Signed)
Some improvement but still not optimally controlled. Will increase effexor to 150mg  xr once daily.

## 2015-04-29 NOTE — Progress Notes (Signed)
Pre visit review using our clinic review tool, if applicable. No additional management support is needed unless otherwise documented below in the visit note. 

## 2015-04-29 NOTE — Progress Notes (Signed)
Subjective:    Patient ID: Wanda Williams, female    DOB: 28-Dec-1970, 45 y.o.   MRN: [id number]  HPI  Wanda Williams is a 45 yr old female who presents today for follow up.  1) Depression/anxiety- Reports some improvement.  She reports that she is still unmotivated.  Wants to be doing more.    2) Carpal Tunnel- Reports about the same.   Not using wrist braces.   3) HTN- did not take meds this AM yet.  BP Readings from Last 3 Encounters:  04/29/15 144/92  03/18/15 128/86  07/22/14 138/82   Reports foul smelling urine x 1 week.  Some right flank pain.  Started around 2/18.  She reports that she worked 8 days in a row.    Review of Systems See HPI  Past Medical History  Diagnosis Date  . Depression   . Anxiety   . HTN (hypertension)     Social History   Social History  . Marital Status: Married    Spouse Name: N/A  . Number of Children: N/A  . Years of Education: N/A   Occupational History  . Not on file.   Social History Main Topics  . Smoking status: Never Smoker   . Smokeless tobacco: Not on file  . Alcohol Use: 0.0 oz/week    0 Standard drinks or equivalent per week     Comment: rare  . Drug Use: No  . Sexual Activity: Yes   Other Topics Concern  . Not on file   Social History Narrative   Works at [employer]   One daughter age 51   One daughter is age 44   Enjoys cross stitching, playing games on her cell phone.     2 dogs   Completed 12th grade.     Past Surgical History  Procedure Laterality Date  . Cesarean section    . Thyroid cyst excision    . Dilation and curettage of uterus    . Tubal ligation  2006    Family History  Problem Relation Age of Onset  . Heart disease Father     heart murmur  . Parkinson's disease Father   . Hypertension Mother   . Coronary artery disease Paternal Uncle     2 uncles (both died before 3)  . Coronary artery disease Paternal Aunt     MI, died before 33    Allergies  Allergen Reactions  . Ace  Inhibitors Cough    Current Outpatient Prescriptions on File Prior to Visit  Medication Sig Dispense Refill  . betamethasone dipropionate (DIPROLENE) 0.05 % cream Apply topically 2 (two) times daily. (Patient not taking: Reported on 04/29/2015) 30 g 0   No current facility-administered medications on file prior to visit.    BP 144/92 mmHg  Pulse 75  Temp(Src) 97.9 F (36.6 C) (Oral)  Resp 16  Ht 4' 10.25" (1.48 m)  Wt 187 lb (84.823 kg)  BMI 38.72 kg/m2  SpO2 97%  LMP 03/30/2015       Objective:   Physical Exam  Constitutional: She appears well-developed and well-nourished.  Cardiovascular: Normal rate, regular rhythm and normal heart sounds.   No murmur heard. Pulmonary/Chest: Effort normal and breath sounds normal. No respiratory distress. She has no wheezes.  Abdominal: Soft. She exhibits no distension. There is no tenderness. There is no CVA tenderness.  Musculoskeletal: She exhibits no edema.  Psychiatric: She has a normal mood and affect. Her behavior is normal. Judgment and  thought content normal.          Assessment & Plan:  Fould smelling urine- UA + nitrites.  Send for culture. Begin macrodantin.

## 2015-04-29 NOTE — Patient Instructions (Addendum)
Start macrodantin for your urine.   Increase your Effexor to 150mg  XR once daily.

## 2015-04-29 NOTE — Assessment & Plan Note (Signed)
BP up today, but pt has not taken AM dose- pt instructed to take AM dose.

## 2015-04-29 NOTE — Assessment & Plan Note (Addendum)
About the same, she is not using wrist braces.  I encouraged her to wear wrist braces HS and as able during the day.

## 2015-05-02 LAB — URINE CULTURE: Colony Count: >=100000

## 2015-05-27 ENCOUNTER — Encounter: Payer: Self-pay | Admitting: Family

## 2015-05-27 ENCOUNTER — Ambulatory Visit (INDEPENDENT_AMBULATORY_CARE_PROVIDER_SITE_OTHER): Payer: Managed Care, Other (non HMO) | Admitting: Family

## 2015-05-27 VITALS — BP 130/90 | HR 78 | Temp 97.5°F | Resp 16 | Ht 58.25 in | Wt 188.4 lb

## 2015-05-27 DIAGNOSIS — F418 Other specified anxiety disorders: Secondary | ICD-10-CM

## 2015-05-27 DIAGNOSIS — R3915 Urgency of urination: Secondary | ICD-10-CM | POA: Diagnosis not present

## 2015-05-27 DIAGNOSIS — N3000 Acute cystitis without hematuria: Secondary | ICD-10-CM

## 2015-05-27 LAB — POC URINALSYSI DIPSTICK (AUTOMATED)
Bilirubin, UA: NEGATIVE
Blood, UA: NEGATIVE
Glucose, UA: NEGATIVE
Ketones, UA: NEGATIVE
LEUKOCYTES UA: NEGATIVE
NITRITE UA: NEGATIVE
PH UA: 5.5
PROTEIN UA: NEGATIVE
Spec Grav, UA: >=1.03
UROBILINOGEN UA: NEGATIVE

## 2015-05-27 MED ORDER — CITALOPRAM HYDROBROMIDE 10 MG PO TABS: 10.0000 mg | ORAL_TABLET | Freq: Every day | ORAL | Status: DC

## 2015-05-27 MED ORDER — VENLAFAXINE HCL ER 150 MG PO CP24: 150.0000 mg | ORAL_CAPSULE | Freq: Every day | ORAL | Status: DC

## 2015-05-27 NOTE — Progress Notes (Signed)
Pre visit review using our clinic review tool, if applicable. No additional management support is needed unless otherwise documented below in the visit note. 

## 2015-05-27 NOTE — Patient Instructions (Signed)
Continue effexor, begin citalopram.

## 2015-05-27 NOTE — Progress Notes (Signed)
Subjective:    Patient ID: Wanda Williams, female    DOB: January 17, 1971, 45 y.o.   MRN: [id number]  HPI  [name] is a 45 yr old female who presents today for follow up of depression/anxiety. Last visit she noted symptoms to be suboptimally controlled.  We increased effexor to 150mg  once daily. Pt feels that her anxiety/depression is improve. Husband notes that she continues to have anxiety symptoms including (stressing about cleaning). Feels very tired, just wants to sleep a lot. Wants to begin exercising but feels too tired.  Appetite is good. Feels like she has things she looks forward to.    BP Readings from Last 3 Encounters:  05/27/15 130/90  04/29/15 144/92  03/18/15 128/86   Yesterday had some difficulty urinating.   BP Readings from Last 3 Encounters:  05/27/15 130/90  04/29/15 144/92  03/18/15 128/86    Review of Systems See HPI  Past Medical History  Diagnosis Date  . Depression   . Anxiety   . HTN (hypertension)     Social History   Social History  . Marital Status: Married    Spouse Name: N/A  . Number of Children: N/A  . Years of Education: N/A   Occupational History  . Not on file.   Social History Main Topics  . Smoking status: Never Smoker   . Smokeless tobacco: Not on file  . Alcohol Use: 0.0 oz/week    0 Standard drinks or equivalent per week     Comment: rare  . Drug Use: No  . Sexual Activity: Yes   Other Topics Concern  . Not on file   Social History Narrative   Works at [employer]   One daughter age 30   One daughter is age 16   Enjoys cross stitching, playing games on her cell phone.     2 dogs   Completed 12th grade.     Past Surgical History  Procedure Laterality Date  . Cesarean section    . Thyroid cyst excision    . Dilation and curettage of uterus    . Tubal ligation  2006    Family History  Problem Relation Age of Onset  . Heart disease Father     heart murmur  . Parkinson's disease Father   . Hypertension  Mother   . Coronary artery disease Paternal Uncle     2 uncles (both died before 14)  . Coronary artery disease Paternal Aunt     MI, died before 41    Allergies  Allergen Reactions  . Ace Inhibitors Cough    Current Outpatient Prescriptions on File Prior to Visit  Medication Sig Dispense Refill  . betamethasone dipropionate (DIPROLENE) 0.05 % cream Apply topically 2 (two) times daily. 30 g 0  . nitrofurantoin, macrocrystal-monohydrate, (MACROBID) 100 MG capsule Take 1 capsule (100 mg total) by mouth 2 (two) times daily. 14 capsule 0  . valsartan (DIOVAN) 160 MG tablet Take 1 tablet (160 mg total) by mouth daily. 60 tablet 5   No current facility-administered medications on file prior to visit.    BP 130/90 mmHg  Pulse 78  Temp(Src) 97.5 F (36.4 C) (Oral)  Resp 16  Ht 4' 10.25" (1.48 m)  Wt 188 lb 6.4 oz (85.458 kg)  BMI 39.01 kg/m2  SpO2 98%  LMP 03/30/2015       Objective:   Physical Exam  Constitutional: She is oriented to person, place, and time. She appears well-developed and well-nourished.  HENT:  Head: Normocephalic and atraumatic.  Cardiovascular: Normal rate, regular rhythm and normal heart sounds.   No murmur heard. Pulmonary/Chest: Effort normal and breath sounds normal. No respiratory distress. She has no wheezes.  Neurological: She is alert and oriented to person, place, and time.  Skin: Skin is warm and dry.  Psychiatric: She has a normal mood and affect. Her behavior is normal. Judgment and thought content normal.          Assessment & Plan:  UTI-  UA clear however culture >100K klebsiella, sensitive to macrodantin. Pt was given a 7 day course of macrobid.

## 2015-05-29 LAB — URINE CULTURE: Colony Count: >=100000

## 2015-05-29 NOTE — Assessment & Plan Note (Signed)
Uncontrolled.  Did well previously on citalopram. This was stopped due to concern about citalopram interfering with libido.  However her libido has not improved since she d/cd the citalopram but her mood is worse. Plan to continue current dose of effexor. Add citalopram 10mg  .

## 2015-07-08 ENCOUNTER — Telehealth: Payer: Self-pay | Admitting: Family

## 2015-07-08 ENCOUNTER — Encounter: Payer: Self-pay | Admitting: Family

## 2015-07-08 ENCOUNTER — Ambulatory Visit (INDEPENDENT_AMBULATORY_CARE_PROVIDER_SITE_OTHER): Payer: Managed Care, Other (non HMO) | Admitting: Family

## 2015-07-08 VITALS — BP 138/86 | HR 77 | Temp 97.9°F | Resp 16 | Ht 58.25 in | Wt 188.2 lb

## 2015-07-08 DIAGNOSIS — F418 Other specified anxiety disorders: Secondary | ICD-10-CM | POA: Diagnosis not present

## 2015-07-08 NOTE — Progress Notes (Signed)
Subjective:    Patient ID: Wanda Williams, female    DOB: Dec 05, 1970, 45 y.o.   MRN: [id number]  HPI  [name] is a 45 yr old female who presents today for follow up of depression/anxiety.  Last visit symptoms were noted to be improved but not optimized.  She was continued on current dose of effexor and citalopram was added to her regimen. Husband reports patient is less on edge with her coworkers.  At the hours more relaxed.  Brother has stage IV cancer. (metastatic melanoma) and is now in hospice. She is stressed about this.   Wt Readings from Last 3 Encounters:  07/08/15 188 lb 3.2 oz (85.367 kg)  05/27/15 188 lb 6.4 oz (85.458 kg)  04/29/15 187 lb (84.823 kg)    Review of Systems See HPI  Past Medical History  Diagnosis Date  . Depression   . Anxiety   . HTN (hypertension)      Social History   Social History  . Marital Status: Married    Spouse Name: N/A  . Number of Children: N/A  . Years of Education: N/A   Occupational History  . Not on file.   Social History Main Topics  . Smoking status: Never Smoker   . Smokeless tobacco: Not on file  . Alcohol Use: 0.0 oz/week    0 Standard drinks or equivalent per week     Comment: rare  . Drug Use: No  . Sexual Activity: Yes   Other Topics Concern  . Not on file   Social History Narrative   Works at [employer]   One daughter age 64   One daughter is age 12   Enjoys cross stitching, playing games on her cell phone.     2 dogs   Completed 12th grade.     Past Surgical History  Procedure Laterality Date  . Cesarean section    . Thyroid cyst excision    . Dilation and curettage of uterus    . Tubal ligation  2006    Family History  Problem Relation Age of Onset  . Heart disease Father     heart murmur  . Parkinson's disease Father   . Hypertension Mother   . Coronary artery disease Paternal Uncle     2 uncles (both died before 86)  . Coronary artery disease Paternal Aunt     MI, died before  7    Allergies  Allergen Reactions  . Ace Inhibitors Cough    Current Outpatient Prescriptions on File Prior to Visit  Medication Sig Dispense Refill  . betamethasone dipropionate (DIPROLENE) 0.05 % cream Apply topically 2 (two) times daily. 30 g 0  . citalopram (CELEXA) 10 MG tablet Take 1 tablet (10 mg total) by mouth daily. 30 tablet 1  . valsartan (DIOVAN) 160 MG tablet Take 1 tablet (160 mg total) by mouth daily. 60 tablet 5  . venlafaxine XR (EFFEXOR XR) 150 MG 24 hr capsule Take 1 capsule (150 mg total) by mouth daily with breakfast. 30 capsule 3   No current facility-administered medications on file prior to visit.    BP 138/86 mmHg  Pulse 77  Temp(Src) 97.9 F (36.6 C) (Oral)  Resp 16  Ht 4' 10.25" (1.48 m)  Wt 188 lb 3.2 oz (85.367 kg)  BMI 38.97 kg/m2  SpO2 98%  LMP 06/18/2015       Objective:   Physical Exam  Constitutional: She is oriented to person, place, and time. She  appears well-developed and well-nourished. No distress.  Neurological: She is alert and oriented to person, place, and time.  Psychiatric: She has a normal mood and affect. Her behavior is normal. Judgment and thought content normal.          Assessment & Plan:

## 2015-07-08 NOTE — Telephone Encounter (Signed)
Pt spouse called in to make provider aware that they are stuck in traffic.

## 2015-07-08 NOTE — Assessment & Plan Note (Signed)
Improved with addition of citalopram. Continue same. Advised pt to monitor weight and let me know if she experiences weight gain.

## 2015-07-08 NOTE — Patient Instructions (Signed)
Continue effexor and citalopram.

## 2015-07-08 NOTE — Progress Notes (Signed)
Pre visit review using our clinic review tool, if applicable. No additional management support is needed unless otherwise documented below in the visit note. 

## 2015-07-21 ENCOUNTER — Telehealth: Payer: Self-pay | Admitting: *Deleted

## 2015-07-21 MED ORDER — CITALOPRAM HYDROBROMIDE 10 MG PO TABS: 10.0000 mg | ORAL_TABLET | Freq: Every day | ORAL | Status: DC

## 2015-07-21 NOTE — Telephone Encounter (Signed)
Received fax from [facility] for citalopram 10mg . Refills sent.

## 2015-09-07 ENCOUNTER — Ambulatory Visit (INDEPENDENT_AMBULATORY_CARE_PROVIDER_SITE_OTHER): Payer: Managed Care, Other (non HMO) | Admitting: Family

## 2015-09-07 ENCOUNTER — Encounter: Payer: Self-pay | Admitting: Family

## 2015-09-07 VITALS — BP 130/86 | HR 84 | Temp 97.6°F | Resp 18 | Ht 58.25 in | Wt 184.6 lb

## 2015-09-07 DIAGNOSIS — R21 Rash and other nonspecific skin eruption: Secondary | ICD-10-CM

## 2015-09-07 DIAGNOSIS — M5432 Sciatica, left side: Secondary | ICD-10-CM

## 2015-09-07 DIAGNOSIS — F418 Other specified anxiety disorders: Secondary | ICD-10-CM | POA: Diagnosis not present

## 2015-09-07 MED ORDER — CYCLOBENZAPRINE HCL 5 MG PO TABS: 5.0000 mg | ORAL_TABLET | Freq: Three times a day (TID) | ORAL | Status: DC | PRN

## 2015-09-07 MED ORDER — MELOXICAM 7.5 MG PO TABS: 7.5000 mg | ORAL_TABLET | Freq: Every day | ORAL | Status: DC

## 2015-09-07 MED ORDER — CITALOPRAM HYDROBROMIDE 10 MG PO TABS: 10.0000 mg | ORAL_TABLET | Freq: Every day | ORAL | Status: DC

## 2015-09-07 NOTE — Progress Notes (Signed)
Subjective:    Patient ID: Wanda Williams, female    DOB: 1970/10/01, 45 y.o.   MRN: [id number]  HPI  [name] is a 45 yr old female who presents today with chief complaint of left hip pain.  Pain radiates down the back of her right leg and has been present x 1 month. She reports + Left low back pain.  Rash- reports pruritic red patches on trunk/thighs x 3 weeks.  Depression- reports feeling well on citalopram 10mg .     Review of Systems See HPI  Past Medical History  Diagnosis Date  . Depression   . Anxiety   . HTN (hypertension)      Social History   Social History  . Marital Status: Married    Spouse Name: N/A  . Number of Children: N/A  . Years of Education: N/A   Occupational History  . Not on file.   Social History Main Topics  . Smoking status: Never Smoker   . Smokeless tobacco: Not on file  . Alcohol Use: 0.0 oz/week    0 Standard drinks or equivalent per week     Comment: rare  . Drug Use: No  . Sexual Activity: Yes   Other Topics Concern  . Not on file   Social History Narrative   Works at [employer]   One daughter age 21   One daughter is age 72   Enjoys cross stitching, playing games on her cell phone.     2 dogs   Completed 12th grade.     Past Surgical History  Procedure Laterality Date  . Cesarean section    . Thyroid cyst excision    . Dilation and curettage of uterus    . Tubal ligation  2006    Family History  Problem Relation Age of Onset  . Heart disease Father     heart murmur  . Parkinson's disease Father   . Hypertension Mother   . Coronary artery disease Paternal Uncle     2 uncles (both died before 87)  . Coronary artery disease Paternal Aunt     MI, died before 56  . Melanoma Brother 42    melanoma    Allergies  Allergen Reactions  . Ace Inhibitors Cough    Current Outpatient Prescriptions on File Prior to Visit  Medication Sig Dispense Refill  . betamethasone dipropionate (DIPROLENE) 0.05 % cream  Apply topically 2 (two) times daily. 30 g 0  . citalopram (CELEXA) 10 MG tablet Take 1 tablet (10 mg total) by mouth daily. 30 tablet 5  . Multiple Vitamins-Minerals (MULTIVITAMIN WITH MINERALS) tablet Take 1 tablet by mouth daily.    . valsartan (DIOVAN) 160 MG tablet Take 1 tablet (160 mg total) by mouth daily. 60 tablet 5  . venlafaxine XR (EFFEXOR XR) 150 MG 24 hr capsule Take 1 capsule (150 mg total) by mouth daily with breakfast. 30 capsule 3   No current facility-administered medications on file prior to visit.    BP 130/86 mmHg  Pulse 84  Temp(Src) 97.6 F (36.4 C) (Oral)  Resp 18  Ht 4' 10.25" (1.48 m)  Wt 184 lb 9.6 oz (83.734 kg)  BMI 38.23 kg/m2  SpO2 98%  LMP 06/14/2015       Objective:   Physical Exam  Constitutional: She is oriented to person, place, and time. She appears well-developed and well-nourished. No distress.  Musculoskeletal:       Cervical back: She exhibits no tenderness.  Thoracic back: She exhibits no tenderness.       Lumbar back: She exhibits no tenderness.  Neurological: She is alert and oriented to person, place, and time.  Reflex Scores:      Patellar reflexes are 2+ on the right side and 2+ on the left side. Bilateral LE strength is 5/5  Skin: Skin is warm and dry.  Several round raised dry appearing pink lesions beneath the bilateral anterior bra-line   Psychiatric: She has a normal mood and affect. Her behavior is normal. Judgment and thought content normal.          Assessment & Plan:  Skin Rash- trial of diprolene cream.   Sciatica- trial of meloxicam, flexeril.  Pt is advised to call if symptoms worsen or do not improve.

## 2015-09-07 NOTE — Progress Notes (Signed)
Pre visit review using our clinic review tool, if applicable. No additional management support is needed unless otherwise documented below in the visit note. 

## 2015-09-07 NOTE — Patient Instructions (Addendum)
Begin Betamethasone cream for your rash twice daily. For sciatica- begin meloxicam once daily. You may use flexeril (muscle relaxer) short term as needed. Call if symptoms worsen or if symptoms are not improved in 2 weeks.

## 2015-09-07 NOTE — Assessment & Plan Note (Signed)
Stable on citalopram, continue same.  

## 2015-09-17 ENCOUNTER — Other Ambulatory Visit: Payer: Self-pay | Admitting: Family

## 2015-09-17 DIAGNOSIS — R921 Mammographic calcification found on diagnostic imaging of breast: Secondary | ICD-10-CM

## 2015-10-07 ENCOUNTER — Ambulatory Visit: Payer: Managed Care, Other (non HMO) | Admitting: Family

## 2015-10-13 ENCOUNTER — Other Ambulatory Visit: Payer: Self-pay | Admitting: Family

## 2015-10-13 ENCOUNTER — Ambulatory Visit
Admission: RE | Admit: 2015-10-13 | Discharge: 2015-10-13 | Disposition: A | Discharge status: Home / Self Care | Payer: Managed Care, Other (non HMO) | Source: Ambulatory Visit | Attending: Family | Admitting: Family

## 2015-10-13 DIAGNOSIS — R921 Mammographic calcification found on diagnostic imaging of breast: Secondary | ICD-10-CM

## 2015-10-13 LAB — HM MAMMOGRAPHY

## 2015-11-10 ENCOUNTER — Other Ambulatory Visit: Payer: Self-pay | Admitting: Family

## 2016-01-04 ENCOUNTER — Ambulatory Visit (INDEPENDENT_AMBULATORY_CARE_PROVIDER_SITE_OTHER): Payer: Managed Care, Other (non HMO) | Admitting: Family Medicine

## 2016-01-04 ENCOUNTER — Encounter: Payer: Self-pay | Admitting: Family Medicine

## 2016-01-04 ENCOUNTER — Telehealth: Payer: Self-pay | Admitting: Family

## 2016-01-04 ENCOUNTER — Encounter: Payer: Self-pay | Admitting: Family

## 2016-01-04 VITALS — BP 132/82 | HR 76

## 2016-01-04 DIAGNOSIS — T7840XA Allergy, unspecified, initial encounter: Secondary | ICD-10-CM | POA: Diagnosis not present

## 2016-01-04 DIAGNOSIS — R079 Chest pain, unspecified: Secondary | ICD-10-CM | POA: Diagnosis not present

## 2016-01-04 MED ORDER — METHYLPREDNISOLONE ACETATE 80 MG/ML IJ SUSP
80.0000 mg | Freq: Once | INTRAMUSCULAR | Status: AC
  Administered 2016-01-04: 80 mg via INTRAMUSCULAR

## 2016-01-04 NOTE — Progress Notes (Signed)
Subjective:    Patient ID: Wanda Williams, female    DOB: 08/01/1970, 45 y.o.   MRN: [id number]  HPI  Wanda Williams is a 45 year old female who presents today for suspicion of an allergic reaction that presented this morning upon awakening. Associated symptom of periorbital edema present bilaterally. She denies SOB, hives, itching, swollen lips or tongue, conjunctival swelling, N/V, dizziness, syncope, or tachycardia. While waiting to be seen she reports becoming anxious due to her "eyes being swollen" shut and reported mild chest discomfort that resolved spontaneously. She denies chest pain, palpitations, syncope, numbness, tingling, weakness, cough, dizziness, indigestion, diaphoresis, N/V, or radiation of pain. She is not on an ACE. Only new food noted is popcorn at the movie theater with butter that she had last night before going to bed. No other aggravating factors noted such as new foods, lotions, facial skin care products, detergents, or environment changes. Treatment at home with benedryl prior to appointment has provided limited benefit.   Review of Systems  Constitutional: Negative for chills, fatigue and fever.  HENT: Negative for postnasal drip, rhinorrhea and sneezing.   Eyes:       Eyelid inflammation bilaterally  Respiratory: Negative for cough, chest tightness, shortness of breath and wheezing.   Cardiovascular: Negative for chest pain and palpitations.  Gastrointestinal: Negative for abdominal pain, nausea and vomiting.  Musculoskeletal: Negative for myalgias.  Skin: Negative for rash.  Neurological: Negative for dizziness, weakness, light-headedness and numbness.  Psychiatric/Behavioral: Negative for agitation.   Past Medical History:  Diagnosis Date  . Anxiety   . Depression   . HTN (hypertension)      Social History   Social History  . Marital status: Married    Spouse name: N/A  . Number of children: N/A  . Years of education: N/A   Occupational History    . Not on file.   Social History Main Topics  . Smoking status: Never Smoker  . Smokeless tobacco: Not on file  . Alcohol use 0.0 oz/week     Comment: rare  . Drug use: No  . Sexual activity: Yes   Other Topics Concern  . Not on file   Social History Narrative   Works at [employer]   One daughter age 72   One daughter is age 78   Enjoys cross stitching, playing games on her cell phone.     2 dogs   Completed 12th grade.     Past Surgical History:  Procedure Laterality Date  . CESAREAN SECTION    . DILATION AND CURETTAGE OF UTERUS    . THYROID CYST EXCISION    . TUBAL LIGATION  2006    Family History  Problem Relation Age of Onset  . Heart disease Father     heart murmur  . Parkinson's disease Father   . Hypertension Mother   . Coronary artery disease Paternal Uncle     2 uncles (both died before 40)  . Coronary artery disease Paternal Aunt     MI, died before 14  . Melanoma Brother 42    melanoma    Allergies  Allergen Reactions  . Ace Inhibitors Cough    Current Outpatient Prescriptions on File Prior to Visit  Medication Sig Dispense Refill  . betamethasone dipropionate (DIPROLENE) 0.05 % cream Apply topically 2 (two) times daily. 30 g 0  . citalopram (CELEXA) 10 MG tablet Take 1 tablet (10 mg total) by mouth daily. 30 tablet 5  . cyclobenzaprine (  FLEXERIL) 5 MG tablet Take 1 tablet (5 mg total) by mouth 3 (three) times daily as needed for muscle spasms. 20 tablet 0  . meloxicam (MOBIC) 7.5 MG tablet Take 1 tablet (7.5 mg total) by mouth daily. 14 tablet 0  . Multiple Vitamins-Minerals (MULTIVITAMIN WITH MINERALS) tablet Take 1 tablet by mouth daily.    . valsartan (DIOVAN) 160 MG tablet Take 1 tablet (160 mg total) by mouth daily. 60 tablet 5  . venlafaxine XR (EFFEXOR-XR) 150 MG 24 hr capsule TAKE 1 CAPSULE (150 MG TOTAL) BY MOUTH DAILY WITH BREAKFAST. 30 capsule 2   No current facility-administered medications on file prior to visit.     BP 132/82  (BP Location: Left Arm, Patient Position: Lying left side, Cuff Size: Large)   Pulse 76   SpO2 97%       Objective:   Physical Exam  Constitutional: She is oriented to person, place, and time. She appears well-developed and well-nourished.  Eyes: Conjunctivae are normal. Pupils are equal, round, and reactive to light.  Neck: Normal range of motion. Neck supple.  Cardiovascular: Normal rate, regular rhythm and intact distal pulses.   Pulmonary/Chest: Effort normal and breath sounds normal. She has no wheezes. She has no rales.  Abdominal: Soft. Bowel sounds are normal. There is no tenderness.  Lymphadenopathy:    She has no cervical adenopathy.  Neurological: She is alert and oriented to person, place, and time. Coordination normal.  Skin: Skin is warm and dry.  Periorbital edema present bilaterally  Psychiatric: She has a normal mood and affect. Her behavior is normal. Thought content normal.       Assessment & Plan:  1. Allergic reaction, initial encounter Presence of periorbital edema present. No tightness in throat or chest, hives, itching or other concerning symptoms present; O2 sat at 97% with VSS. Suspect allergic reaction that may be due to food, or soap products. Advised patient to use dye free/scent free products and avoid new foods or medications unless instructed by a healthcare professional.   Depomedrol provided in office and patient responded well with beginning resolution of periorbital edema.  Advise use of Allegra 180 mg today and daily. Follow up with PCP within one week or sooner if needed.  2. Chest pain, unspecified type EKG tracing is personally reviewed.  EKG notes NSR.  Rate: 68. No chest pain or concerning symptoms present during exam. Suspect that initial mild chest discomfort in waiting room may be due to anxiety relating to symptoms that resolved spontaneously. Advised patient regarding return precautions including chest pain, palpitations, SOB, dizziness,  numbness, or weakness.  Reinforced signs and symptoms of an allergic reaction and symptoms that require immediate medical attention. We also discussed keeping a diary of food for review. Patient and husband verbalized understanding and agreed with plan.  - EKG 12-Lead  Delano Metz, FNP-C

## 2016-01-04 NOTE — Telephone Encounter (Signed)
Pt has been scheduled at the [facility].

## 2016-01-04 NOTE — Telephone Encounter (Signed)
Patient Name: JACQUELEEN WERMAN DOB: 16-Jul-1970 Initial Comment Caller States his wife has had an allergic reaction and her eyes are swollen Nurse Assessment Nurse: Ronnald Ramp, RN, Miranda Date/Time (Eastern Time): 01/04/2016 8:54:48 AM Confirm and document reason for call. If symptomatic, describe symptoms. You must click the next button to save text entered. ---Caller states his wife's eyes are swollen shut and red. She has itching around her eyes and her head. Also with nausea. Symptoms started 4 hrs ago. Has the patient traveled out of the country within the last 30 days? ---No Does the patient have any new or worsening symptoms? ---Yes Will a triage be completed? ---Yes Related visit to physician within the last 2 weeks? ---No Does the PT have any chronic conditions? (i.e. diabetes, asthma, etc.) ---Yes List chronic conditions. ---Depression, HTN Is the patient pregnant or possibly pregnant? (Ask all females between the ages of 4-55) ---No Is this a behavioral health or substance abuse call? ---No Guidelines Guideline Title Affirmed Question Affirmed Notes Eye - Swelling [1] SEVERE eyelid swelling (i.e., shut or almost) AND [2] involves both eyes (Exception: itchy eyes, which are probably an allergic reaction) Final Disposition User See Physician within 4 Hours (or PCP triage) Ronnald Ramp, RN, Miranda Comments Also reports anxiety and chest pain from stress at work yesterday NO appt available with PCP or at primary office within time frame. Appt scheduled with Delano Metz, NP at [facility] for 9:30am Referrals GO TO FACILITY OTHER - SPECIFY Disagree/Comply: Comply

## 2016-01-04 NOTE — Telephone Encounter (Signed)
Caller name: Leonardo Relationship to patient: husband Can be reached: [phone]  Reason for call: Lianne Cure called stating pt having allergic reaction and eyes are swollen. Transferred to [facility].

## 2016-01-04 NOTE — Progress Notes (Signed)
Pre visit review using our clinic review tool, if applicable. No additional management support is needed unless otherwise documented below in the visit note. 

## 2016-01-04 NOTE — Patient Instructions (Signed)
Please take Allegra 180 mg daily and use dye free, hypoallergenic products as discussed. Please see information below and follow up with [name]   Anaphylactic Reaction An anaphylactic reaction is a sudden, severe allergic reaction that involves the whole body. It can be life threatening. A hospital stay is often required. People with asthma, eczema, or hay fever are slightly more likely to have an anaphylactic reaction. CAUSES  An anaphylactic reaction may be caused by anything to which you are allergic. After being exposed to the allergic substance, your immune system becomes sensitized to it. When you are exposed to that allergic substance again, an allergic reaction can occur. Common causes of an anaphylactic reaction include:  Medicines.  Foods, especially peanuts, wheat, shellfish, milk, and eggs.  Insect bites or stings.  Blood products.  Chemicals, such as dyes, latex, and contrast material used for imaging tests. SYMPTOMS  When an allergic reaction occurs, the body releases histamine and other substances. These substances cause symptoms such as tightening of the airway. Symptoms often develop within seconds or minutes of exposure. Symptoms may include:  Skin rash or hives.  Itching.  Chest tightness.  Swelling of the eyes, tongue, or lips.  Trouble breathing or swallowing.  Lightheadedness or fainting.  Anxiety or confusion.  Stomach pains, vomiting, or diarrhea.  Nasal congestion.  A fast or irregular heartbeat (palpitations). DIAGNOSIS  Diagnosis is based on your history of recent exposure to allergic substances, your symptoms, and a physical exam. Your caregiver may also perform blood or urine tests to confirm the diagnosis. TREATMENT  Epinephrine medicine is the main treatment for an anaphylactic reaction. Other medicines that may be used for treatment include antihistamines, steroids, and albuterol. In severe cases, fluids and medicine to support  blood pressure may be given through an intravenous line (IV). Even if you improve after treatment, you need to be observed to make sure your condition does not get worse. This may require a stay in the hospital. [place] a medical alert bracelet or necklace stating your allergy.  You and your family must learn how to use an anaphylaxis kit or give an epinephrine injection to temporarily treat an emergency allergic reaction. Always carry your epinephrine injection or anaphylaxis kit with you. This can be lifesaving if you have a severe reaction.  Do not drive or perform tasks after treatment until the medicines used to treat your reaction have worn off, or until your caregiver says it is okay.  If you have hives or a rash:  Take medicines as directed by your caregiver.  You may use an over-the-counter antihistamine (diphenhydramine) as needed.  Apply cold compresses to the skin or take baths in cool water. Avoid hot baths or showers. SEEK MEDICAL CARE IF:   You develop symptoms of an allergic reaction to a new substance. Symptoms may start right away or minutes later.  You develop a rash, hives, or itching.  You develop new symptoms. SEEK IMMEDIATE MEDICAL CARE IF:   You have swelling of the mouth, difficulty breathing, or wheezing.  You have a tight feeling in your chest or throat.  You develop hives, swelling, or itching all over your body.  You develop severe vomiting or diarrhea.  You feel faint or pass out. This is an emergency. Use your epinephrine injection or anaphylaxis kit as you have been instructed. Call your local emergency services (911 in [place] Even if you improve after the injection, you need to be examined at a  hospital emergency department. MAKE SURE YOU:   Understand these instructions.  Will watch your condition.  Will get help right away if you are not doing well or get worse.   This information is not intended to replace advice  given to you by your health care provider. Make sure you discuss any questions you have with your health care provider.   Document Released: 02/21/2005 Document Revised: 02/26/2013 Document Reviewed: 09/03/2014 Elsevier Interactive Patient Education [employer].

## 2016-01-05 ENCOUNTER — Ambulatory Visit (INDEPENDENT_AMBULATORY_CARE_PROVIDER_SITE_OTHER): Payer: Managed Care, Other (non HMO) | Admitting: Family Medicine

## 2016-01-05 ENCOUNTER — Encounter: Payer: Self-pay | Admitting: Family Medicine

## 2016-01-05 VITALS — BP 130/80 | HR 79 | Resp 12 | Ht 58.25 in | Wt 180.1 lb

## 2016-01-05 DIAGNOSIS — H01119 Allergic dermatitis of unspecified eye, unspecified eyelid: Secondary | ICD-10-CM

## 2016-01-05 MED ORDER — PREDNISONE 20 MG PO TABS: 40.0000 mg | ORAL_TABLET | Freq: Every day | ORAL | 0 refills | Status: AC

## 2016-01-05 NOTE — Progress Notes (Signed)
HPI:  ACUTE VISIT:  Chief Complaint  Patient presents with  . swollen eyes    [name] is a 45 y.o. female, who is here today with husband and daughter complaining of 1-2 days of sudden onset periocular erythema and eye lid edema. Erythematous areas are very pruritic.  Woke up around 5 am yesterday with symptoms. + Epiphora, mainly in the morning but has not noted purulent eye drainage or conjunctivae erythema. No visual changes or photophobia. She also denies fever,chills, or headache associated.  She was seen yesterday. She needs a note for work.  She denies any new medication, detergent, soap, or body product. She works at [employer] and has been under a lot of stress; she remembers she was crying the night before but doe snot recall having new product/chemicals on hands. Hx of depression and anxiety, she wonders if any of these problem or stress can cause rash like this one.  No known insect bite or outdoor exposures to plants. No sick contact. She has Hx of eczema but never on face, usually on legs and occasionally hands. No similar rash in the past.  OTC medication for this problem: None. She received Depo-Medrol 80 mg yesterday and ahs helped. Now she can open eyes.     She denies oral lesions/edema,cough, wheezing, dyspnea, abdominal pain, nausea, or vomiting.    Review of Systems  Constitutional: Negative for appetite change, chills, fatigue and fever.  HENT: Negative for congestion, mouth sores, nosebleeds, postnasal drip, sneezing, sore throat, trouble swallowing and voice change.   Eyes: Positive for discharge (epiphora). Negative for photophobia, pain, redness and visual disturbance.  Respiratory: Negative for cough, shortness of breath, wheezing and stridor.   Gastrointestinal: Negative for abdominal pain, diarrhea, nausea and vomiting.  Musculoskeletal: Negative for myalgias and neck pain.  Skin: Positive for rash. Negative for pallor.    Allergic/Immunologic: Negative for environmental allergies and food allergies.  Neurological: Negative for facial asymmetry, weakness, numbness and headaches.  Hematological: Negative for adenopathy. Does not bruise/bleed easily.  Psychiatric/Behavioral: Negative for confusion. The patient is nervous/anxious.       Current Outpatient Prescriptions on File Prior to Visit  Medication Sig Dispense Refill  . betamethasone dipropionate (DIPROLENE) 0.05 % cream Apply topically 2 (two) times daily. 30 g 0  . citalopram (CELEXA) 10 MG tablet Take 1 tablet (10 mg total) by mouth daily. 30 tablet 5  . cyclobenzaprine (FLEXERIL) 5 MG tablet Take 1 tablet (5 mg total) by mouth 3 (three) times daily as needed for muscle spasms. 20 tablet 0  . meloxicam (MOBIC) 7.5 MG tablet Take 1 tablet (7.5 mg total) by mouth daily. 14 tablet 0  . Multiple Vitamins-Minerals (MULTIVITAMIN WITH MINERALS) tablet Take 1 tablet by mouth daily.    . valsartan (DIOVAN) 160 MG tablet Take 1 tablet (160 mg total) by mouth daily. 60 tablet 5  . venlafaxine XR (EFFEXOR-XR) 150 MG 24 hr capsule TAKE 1 CAPSULE (150 MG TOTAL) BY MOUTH DAILY WITH BREAKFAST. 30 capsule 2   No current facility-administered medications on file prior to visit.      Past Medical History:  Diagnosis Date  . Anxiety   . Depression   . HTN (hypertension)    Allergies  Allergen Reactions  . Ace Inhibitors Cough    Social History   Social History  . Marital status: Married    Spouse name: N/A  . Number of children: N/A  . Years of education: N/A  Social History Main Topics  . Smoking status: Never Smoker  . Smokeless tobacco: None  . Alcohol use 0.0 oz/week     Comment: rare  . Drug use: No  . Sexual activity: Yes   Other Topics Concern  . None   Social History Narrative   Works at [employer]   One daughter age 79   One daughter is age 62   Enjoys cross stitching, playing games on her cell phone.     2 dogs   Completed  12th grade.     Vitals:   01/05/16 0847  BP: 130/80  Pulse: 79  Resp: 12    O2 sat at RA 97%.  Body mass index is 37.32 kg/m.      Physical Exam  Nursing note and vitals reviewed. Constitutional: She is oriented to person, place, and time. She appears well-developed. She does not appear ill. No distress.  HENT:  Head: Atraumatic.  Mouth/Throat: Oropharynx is clear and moist and mucous membranes are normal.  Eyes: Conjunctivae and EOM are normal. Pupils are equal, round, and reactive to light. Right eye exhibits no discharge, no exudate and no hordeolum. No foreign body present in the right eye. Left eye exhibits no discharge, no exudate and no hordeolum. No foreign body present in the left eye.  Mild edema of upper eyelids bilateral, no tenderness. Mild erythema lower eye lids.  Cardiovascular: Normal rate and regular rhythm.   Respiratory: Effort normal and breath sounds normal. No stridor. No respiratory distress.  Lymphadenopathy:       Head (right side): No preauricular and no posterior auricular adenopathy present.       Head (left side): No preauricular and no posterior auricular adenopathy present.    She has no cervical adenopathy.  Neurological: She is alert and oriented to person, place, and time.  Skin: Skin is warm. Rash noted. There is erythema.  See eye examination for details. Scratching signs on upper eye lids, R>L.  Psychiatric: Her speech is normal. Her affect is labile.  Well groomed, good eye contact.      ASSESSMENT AND PLAN:     Wanda Williams was seen today for swollen eyes.  Diagnoses and all orders for this visit:  Contact dermatitis of eyelid, unspecified laterality -     predniSONE (DELTASONE) 20 MG tablet; Take 2 tablets (40 mg total) by mouth daily with breakfast.   History and localization of Rash suggest contact dermatitis, which seems to be improving. After discussion of side effects, she agrees with trying a short prednisone course, 3  days. Also Zyrtec 10 mg daily recommended. Avoid scratching areas, keep area moisturized, sun screen to prevent post inflammatory pigmentation changes. Monitor for warning signs. excuse for work given. Follow-up as needed.     Return if symptoms worsen or fail to improve.     -[name] was advised to return or notify a doctor immediately if symptoms worsen or persist or new concerns arise, she voices understanding.       Betty G. Martinique, MD  [facility]. [place] office.

## 2016-01-05 NOTE — Patient Instructions (Addendum)
  [name] I have seen you today for an acute visit.  1. Contact dermatitis of eyelid, unspecified laterality  - predniSONE (DELTASONE) 20 MG tablet; Take 2 tablets (40 mg total) by mouth daily with breakfast.  Dispense: 6 tablet; Refill: 0  Take medication with food.  Seems allergic to something you rubbed on eyes when crying. Zyrtec 10 mg daily in the morning. He can take Benadryl 25 mg at the time to help with itching.  Mositurizing eye lids may also help with healing. Avoid scratching and wear sun screen to decrease risk of pigmentation changes.   In general please monitor for signs of worsening symptoms and seek immediate medical attention if any concerning/warning symptom as we discussed.   If symptoms are not resolved in 4-7 days you should schedule a follow up appointment with your doctor, before if needed.  Please be sure you have an appointment already scheduled with your PCP before you leave today.

## 2016-01-05 NOTE — Progress Notes (Signed)
Pre visit review using our clinic review tool, if applicable. No additional management support is needed unless otherwise documented below in the visit note. 

## 2016-01-08 ENCOUNTER — Ambulatory Visit: Payer: Self-pay | Admitting: Family

## 2016-01-08 ENCOUNTER — Telehealth: Payer: Self-pay | Admitting: Family

## 2016-01-08 DIAGNOSIS — Z0289 Encounter for other administrative examinations: Secondary | ICD-10-CM

## 2016-01-08 NOTE — Telephone Encounter (Signed)
Pt spouse Leonardo lvm at 7:52 cancelling pt's appt due to her not feeling well. He says that she is having some congestions and vomiting. They will call back to reschedule appt.

## 2016-01-08 NOTE — Telephone Encounter (Signed)
Note, no charge.

## 2016-01-18 ENCOUNTER — Telehealth: Payer: Self-pay | Admitting: Family

## 2016-01-18 NOTE — Telephone Encounter (Signed)
Called patient. States she feels like she wants to sleep all the time like before. Patient denies suicidal ideation.S tates she had to come in Wednesday because she has to work all other days,Agrees to go  to ED if symptoms worsen

## 2016-01-18 NOTE — Telephone Encounter (Signed)
Called patient. Left message for return call. 

## 2016-01-18 NOTE — Telephone Encounter (Signed)
Noted, when pt calls back please assess for suicidal ideation. If not suicidal, ok to keep appointment for Wednesday.

## 2016-01-18 NOTE — Telephone Encounter (Signed)
Caller name: Thereasa Solo Relationship to patient: husband Can be reached: [phone]  Reason for call: call from husband, pt crying/sleeping a lot, scheduled for Wednesday at his request (declined earlier appt). He stated that pt is on meds but he feels they may need changed.

## 2016-01-20 ENCOUNTER — Ambulatory Visit (INDEPENDENT_AMBULATORY_CARE_PROVIDER_SITE_OTHER): Payer: Managed Care, Other (non HMO) | Admitting: Family

## 2016-01-20 ENCOUNTER — Encounter: Payer: Self-pay | Admitting: Family

## 2016-01-20 VITALS — BP 135/96 | HR 74 | Temp 98.3°F | Resp 14 | Ht 58.25 in | Wt 183.0 lb

## 2016-01-20 DIAGNOSIS — F329 Major depressive disorder, single episode, unspecified: Secondary | ICD-10-CM

## 2016-01-20 DIAGNOSIS — I10 Essential (primary) hypertension: Secondary | ICD-10-CM | POA: Diagnosis not present

## 2016-01-20 DIAGNOSIS — H01139 Eczematous dermatitis of unspecified eye, unspecified eyelid: Secondary | ICD-10-CM

## 2016-01-20 DIAGNOSIS — Z23 Encounter for immunization: Secondary | ICD-10-CM | POA: Diagnosis not present

## 2016-01-20 DIAGNOSIS — T7840XA Allergy, unspecified, initial encounter: Secondary | ICD-10-CM | POA: Diagnosis not present

## 2016-01-20 DIAGNOSIS — F32A Depression, unspecified: Secondary | ICD-10-CM

## 2016-01-20 MED ORDER — CITALOPRAM HYDROBROMIDE 20 MG PO TABS: 20.0000 mg | ORAL_TABLET | Freq: Every day | ORAL | 3 refills | Status: DC

## 2016-01-20 MED ORDER — CETIRIZINE HCL 10 MG PO TABS: 10.0000 mg | ORAL_TABLET | Freq: Every day | ORAL | 11 refills | Status: DC

## 2016-01-20 MED ORDER — VALSARTAN-HYDROCHLOROTHIAZIDE 160-25 MG PO TABS: 1.0000 | ORAL_TABLET | Freq: Every day | ORAL | 2 refills | Status: DC

## 2016-01-20 NOTE — Patient Instructions (Signed)
Stop diovan, begin diovan HCTZ once daily. Increase citalopram from 10mg  to 20mg  once daily. You will be contacted about your referral to the allergist and the dermatologist.  Please contact [facility] [phone].

## 2016-01-20 NOTE — Progress Notes (Signed)
Subjective:    Patient ID: Wanda Williams, female    DOB: 21-Mar-1970, 45 y.o.   MRN: [id number]  HPI  [name] is a 45 yr old female who presents today to discuss depression. She is accompanied today by her husband and her daughter. She reports that she has been crying more often than usual and sleeping a lot.  Has been more emotional lately, fidgety. Denies SI/HI. She continues citalopram and effexor.  She started working nights 1 PM until 9:30 PM.  Getting overwhelmed at work.  Anxious sometimes.  Notes that she has been getting angry with her coworkers.  Symptoms seemed to worsen x 1 month.   She had swelling of her eyelids in the end of October. Saw Dr. Martinique, has had no further swelling but notes dry skin on her eyelids and itching.  HTN- reports good compliance with her medications.  .  Review of Systems See HPI  Past Medical History:  Diagnosis Date  . Anxiety   . Depression   . HTN (hypertension)      Social History   Social History  . Marital status: Married    Spouse name: N/A  . Number of children: N/A  . Years of education: N/A   Occupational History  . Not on file.   Social History Main Topics  . Smoking status: Never Smoker  . Smokeless tobacco: Not on file  . Alcohol use 0.0 oz/week     Comment: rare  . Drug use: No  . Sexual activity: Yes   Other Topics Concern  . Not on file   Social History Narrative   Works at [employer]   One daughter age 99   One daughter is age 43   Enjoys cross stitching, playing games on her cell phone.     2 dogs   Completed 12th grade.     Past Surgical History:  Procedure Laterality Date  . CESAREAN SECTION    . DILATION AND CURETTAGE OF UTERUS    . THYROID CYST EXCISION    . TUBAL LIGATION  2006    Family History  Problem Relation Age of Onset  . Heart disease Father     heart murmur  . Parkinson's disease Father   . Hypertension Mother   . Coronary artery disease Paternal Uncle     2 uncles  (both died before 85)  . Coronary artery disease Paternal Aunt     MI, died before 43  . Melanoma Brother 42    melanoma    Allergies  Allergen Reactions  . Ace Inhibitors Cough    Current Outpatient Prescriptions on File Prior to Visit  Medication Sig Dispense Refill  . betamethasone dipropionate (DIPROLENE) 0.05 % cream Apply topically 2 (two) times daily. 30 g 0  . citalopram (CELEXA) 10 MG tablet Take 1 tablet (10 mg total) by mouth daily. 30 tablet 5  . cyclobenzaprine (FLEXERIL) 5 MG tablet Take 1 tablet (5 mg total) by mouth 3 (three) times daily as needed for muscle spasms. 20 tablet 0  . meloxicam (MOBIC) 7.5 MG tablet Take 1 tablet (7.5 mg total) by mouth daily. 14 tablet 0  . Multiple Vitamins-Minerals (MULTIVITAMIN WITH MINERALS) tablet Take 1 tablet by mouth daily.    . valsartan (DIOVAN) 160 MG tablet Take 1 tablet (160 mg total) by mouth daily. 60 tablet 5  . venlafaxine XR (EFFEXOR-XR) 150 MG 24 hr capsule TAKE 1 CAPSULE (150 MG TOTAL) BY MOUTH DAILY WITH BREAKFAST.  30 capsule 2   No current facility-administered medications on file prior to visit.     BP (!) 135/96 (BP Location: Left Arm, Patient Position: Sitting, Cuff Size: Large)   Pulse 74   Temp 98.3 F (36.8 C) (Oral)   Resp 14   Ht 4' 10.25" (1.48 m)   Wt 183 lb (83 kg)   SpO2 97% Comment: RA  BMI 37.92 kg/m       Objective:   Physical Exam  Constitutional: She appears well-developed and well-nourished.  HENT:  Head: Normocephalic and atraumatic.  Cardiovascular: Normal rate, regular rhythm and normal heart sounds.   No murmur heard. Pulmonary/Chest: Effort normal and breath sounds normal. No respiratory distress. She has no wheezes.  Musculoskeletal: She exhibits no edema.  Skin:  + eczema of eyelids, no swelling.   Psychiatric: She has a normal mood and affect. Her behavior is normal. Judgment and thought content normal.          Assessment & Plan:  HTN- uncontrolled.  D/c diovan,  begin diovan hct. BP Readings from Last 3 Encounters:  01/20/16 (!) 135/96  01/05/16 130/80  01/04/16 132/82   Eczema (eyelids)- refer to dermatology.    Allergic reaction- resolved (had eyelid swelling) refer to allergist for allergy testing.   Depression- uncontrolled.  Advised pt to schedule an appointment with a therapist. Will also increase citalopram from 10mg  to 20mg  once daily.   Flu shot today

## 2016-01-20 NOTE — Progress Notes (Signed)
Pre visit review using our clinic review tool, if applicable. No additional management support is needed unless otherwise documented below in the visit note. 

## 2016-02-02 ENCOUNTER — Other Ambulatory Visit (HOSPITAL_COMMUNITY)
Admission: RE | Admit: 2016-02-02 | Discharge: 2016-02-02 | Disposition: A | Discharge status: Home / Self Care | Payer: Managed Care, Other (non HMO) | Source: Ambulatory Visit | Attending: Family | Admitting: Family

## 2016-02-02 ENCOUNTER — Ambulatory Visit (INDEPENDENT_AMBULATORY_CARE_PROVIDER_SITE_OTHER): Payer: Managed Care, Other (non HMO) | Admitting: Family

## 2016-02-02 ENCOUNTER — Encounter: Payer: Self-pay | Admitting: Family

## 2016-02-02 VITALS — BP 134/86 | HR 104 | Temp 98.0°F | Resp 16 | Ht <= 58 in | Wt 182.8 lb

## 2016-02-02 DIAGNOSIS — I10 Essential (primary) hypertension: Secondary | ICD-10-CM

## 2016-02-02 DIAGNOSIS — F418 Other specified anxiety disorders: Secondary | ICD-10-CM | POA: Diagnosis not present

## 2016-02-02 DIAGNOSIS — N898 Other specified noninflammatory disorders of vagina: Secondary | ICD-10-CM | POA: Insufficient documentation

## 2016-02-02 LAB — BASIC METABOLIC PANEL
BUN: 17 mg/dL (ref 6–23)
CALCIUM: 9.5 mg/dL (ref 8.4–10.5)
CO2: 32 mEq/L (ref 19–32)
CREATININE: 0.56 mg/dL (ref 0.40–1.20)
Chloride: 101 mEq/L (ref 96–112)
GFR: 122.88 mL/min (ref >60.00)
Glucose, Bld: 98 mg/dL (ref 70–99)
Potassium: 3.7 mEq/L (ref 3.5–5.1)
Sodium: 140 mEq/L (ref 135–145)

## 2016-02-02 NOTE — Assessment & Plan Note (Signed)
BP improved. Continue diovan HCT, advised pt to stand slowly- call if dizziness symptoms worsen.

## 2016-02-02 NOTE — Progress Notes (Signed)
Subjective:    Patient ID: Wanda Williams, female    DOB: 1970-05-27, 45 y.o.   MRN: [id number]  HPI  [name] is a 45 yr old female who presents today for follow up.  1) HTN- Last visit her diovan was changed to diovan HCT. She notes mild dizziness sometimes if she stands suddenly.   BP Readings from Last 3 Encounters:  02/02/16 134/86  01/20/16 (!) 135/96  01/05/16 130/80   2) Depression- Last visit she noted that she had been crying more often than usual and sleeping a lot.  Had been more emotional lately, fidgety. Last visit her citalopram was increased from 10mg  to 20mg .  She reports that she wakes up around 1 AM and then falls back to sleep. Husband notes that she still does not want to be intimate very often.   3) Vaginal odor- notes + vaginal odor.    Review of Systems See HPI  Past Medical History:  Diagnosis Date  . Anxiety   . Depression   . HTN (hypertension)      Social History   Social History  . Marital status: Married    Spouse name: N/A  . Number of children: N/A  . Years of education: N/A   Occupational History  . Not on file.   Social History Main Topics  . Smoking status: Never Smoker  . Smokeless tobacco: Not on file  . Alcohol use 0.0 oz/week     Comment: rare  . Drug use: No  . Sexual activity: Yes   Other Topics Concern  . Not on file   Social History Narrative   Works at [employer]   One daughter age 9   One daughter is age 24   Enjoys cross stitching, playing games on her cell phone.     2 dogs   Completed 12th grade.     Past Surgical History:  Procedure Laterality Date  . CESAREAN SECTION    . DILATION AND CURETTAGE OF UTERUS    . THYROID CYST EXCISION    . TUBAL LIGATION  2006    Family History  Problem Relation Age of Onset  . Heart disease Father     heart murmur  . Parkinson's disease Father   . Hypertension Mother   . Coronary artery disease Paternal Uncle     2 uncles (both died before 63)  .  Coronary artery disease Paternal Aunt     MI, died before 35  . Melanoma Brother 42    melanoma    Allergies  Allergen Reactions  . Ace Inhibitors Cough    Current Outpatient Prescriptions on File Prior to Visit  Medication Sig Dispense Refill  . betamethasone dipropionate (DIPROLENE) 0.05 % cream Apply topically 2 (two) times daily. 30 g 0  . cetirizine (ZYRTEC) 10 MG tablet Take 1 tablet (10 mg total) by mouth daily. 30 tablet 11  . citalopram (CELEXA) 20 MG tablet Take 1 tablet (20 mg total) by mouth daily. 30 tablet 3  . cyclobenzaprine (FLEXERIL) 5 MG tablet Take 1 tablet (5 mg total) by mouth 3 (three) times daily as needed for muscle spasms. 20 tablet 0  . meloxicam (MOBIC) 7.5 MG tablet Take 1 tablet (7.5 mg total) by mouth daily. 14 tablet 0  . Multiple Vitamins-Minerals (MULTIVITAMIN WITH MINERALS) tablet Take 1 tablet by mouth daily.    . valsartan-hydrochlorothiazide (DIOVAN-HCT) 160-25 MG tablet Take 1 tablet by mouth daily. 30 tablet 2  . venlafaxine XR (  EFFEXOR-XR) 150 MG 24 hr capsule TAKE 1 CAPSULE (150 MG TOTAL) BY MOUTH DAILY WITH BREAKFAST. 30 capsule 2   No current facility-administered medications on file prior to visit.     BP 134/86 (BP Location: Left Arm, Cuff Size: Large)   Pulse (!) 104   Temp 98 F (36.7 C) (Oral)   Resp 16   Ht 4\' 10"  (1.473 m)   Wt 182 lb 12.8 oz (82.9 kg)   LMP 06/22/2015   SpO2 98%   BMI 38.21 kg/m       Objective:   Physical Exam  Constitutional: She is oriented to person, place, and time. She appears well-developed and well-nourished.  HENT:  Head: Normocephalic and atraumatic.  Cardiovascular: Normal rate, regular rhythm and normal heart sounds.   No murmur heard. Pulmonary/Chest: Effort normal and breath sounds normal. No respiratory distress. She has no wheezes.  Musculoskeletal: She exhibits no edema.  Neurological: She is alert and oriented to person, place, and time.  Psychiatric: She has a normal mood and  affect. Her behavior is normal. Judgment and thought content normal.          Assessment & Plan:  Vaginal odor- will check [facility] for ancillary testing (gardnerella/yeast).

## 2016-02-02 NOTE — Progress Notes (Signed)
Pre visit review using our clinic review tool, if applicable. No additional management support is needed unless otherwise documented below in the visit note. 

## 2016-02-02 NOTE — Patient Instructions (Addendum)
Please complete lab work prior to leaving.  Continue current medications. Keep your upcoming appointment with the counselor.

## 2016-02-02 NOTE — Assessment & Plan Note (Signed)
Overall improving. Still has low libido.  I encouraged her to keep her upcoming appointment with the counselor and to continue current dose of citalopram. She is also advised to monitor her weight for any weight gain- pt verbalizes understanding.

## 2016-02-05 ENCOUNTER — Encounter: Payer: Self-pay | Admitting: Family

## 2016-02-05 LAB — URINE CYTOLOGY ANCILLARY ONLY
Bacterial vaginitis: NEGATIVE
Candida vaginitis: NEGATIVE

## 2016-02-09 ENCOUNTER — Other Ambulatory Visit: Payer: Self-pay | Admitting: Family

## 2016-02-23 ENCOUNTER — Emergency Department (HOSPITAL_COMMUNITY): Payer: Worker's Compensation

## 2016-02-23 ENCOUNTER — Emergency Department (HOSPITAL_COMMUNITY)
Admission: EM | Admit: 2016-02-23 | Discharge: 2016-02-24 | Disposition: A | Discharge status: Home / Self Care | Payer: Worker's Compensation | Attending: Emergency Medicine | Admitting: Emergency Medicine

## 2016-02-23 ENCOUNTER — Encounter (HOSPITAL_COMMUNITY): Payer: Self-pay | Admitting: Emergency Medicine

## 2016-02-23 DIAGNOSIS — S61011A Laceration without foreign body of right thumb without damage to nail, initial encounter: Secondary | ICD-10-CM | POA: Diagnosis present

## 2016-02-23 DIAGNOSIS — W278XXA Contact with other nonpowered hand tool, initial encounter: Secondary | ICD-10-CM | POA: Insufficient documentation

## 2016-02-23 DIAGNOSIS — Y9389 Activity, other specified: Secondary | ICD-10-CM | POA: Insufficient documentation

## 2016-02-23 DIAGNOSIS — Y9289 Other specified places as the place of occurrence of the external cause: Secondary | ICD-10-CM | POA: Diagnosis not present

## 2016-02-23 DIAGNOSIS — Y990 Civilian activity done for income or pay: Secondary | ICD-10-CM | POA: Diagnosis not present

## 2016-02-23 DIAGNOSIS — Z79899 Other long term (current) drug therapy: Secondary | ICD-10-CM | POA: Diagnosis not present

## 2016-02-23 DIAGNOSIS — I10 Essential (primary) hypertension: Secondary | ICD-10-CM | POA: Insufficient documentation

## 2016-02-23 MED ORDER — BUPIVACAINE HCL (PF) 0.25 % IJ SOLN
10.0000 mL | Freq: Once | INTRAMUSCULAR | Status: AC
  Administered 2016-02-23: 10 mL
  Filled 2016-02-23: qty 30

## 2016-02-23 MED ORDER — BUPIVACAINE HCL (PF) 0.5 % IJ SOLN
10.0000 mL | Freq: Once | INTRAMUSCULAR | Status: DC
  Filled 2016-02-23: qty 10

## 2016-02-23 MED ORDER — LIDOCAINE HCL (PF) 1 % IJ SOLN
5.0000 mL | Freq: Once | INTRAMUSCULAR | Status: AC
  Administered 2016-02-23: 5 mL
  Filled 2016-02-23 (×2): qty 5

## 2016-02-23 NOTE — ED Provider Notes (Signed)
[facility] DEPT Provider Note   CSN: [id number] Arrival date & time: 02/23/16  2046 By signing my name below, I, Wanda Williams, attest that this documentation has been prepared under the direction and in the presence of Debroah Baller, [place]. Electronically Signed: Georgette Williams, ED Scribe. 02/23/16. 9:40 PM.  History   Chief Complaint Chief Complaint  Patient presents with  . Finger Injury   HPI Comments: Wanda Williams is a 45 y.o. female with h/o HTN who presents to the Emergency Department complaining of a laceration to her right distal thumb s/p mechanical injury this afternoon. Pt states she was using a meat slicer at work when she accidentally struck her finger. Bleeding is controlled with a bandage. She denies fever, chills, or any other associated symptoms. Pt's Tdap is UTD.   The history is provided by the patient. No language interpreter was used.    Past Medical History:  Diagnosis Date  . Anxiety   . Depression   . HTN (hypertension)     Patient Active Problem List   Diagnosis Date Noted  . Amenorrhea 03/18/2015  . Carpal tunnel syndrome 03/18/2015  . Preventative health care 01/22/2014  . Depression with anxiety 09/04/2013  . Hypertension 11/10/2010    Past Surgical History:  Procedure Laterality Date  . CESAREAN SECTION    . DILATION AND CURETTAGE OF UTERUS    . THYROID CYST EXCISION    . TUBAL LIGATION  2006    OB History    No data available       Home Medications    Prior to Admission medications   Medication Sig Start Date End Date Taking? Authorizing Provider  betamethasone dipropionate (DIPROLENE) 0.05 % cream Apply topically 2 (two) times daily. 03/18/15   [name], NP  cetirizine (ZYRTEC) 10 MG tablet Take 1 tablet (10 mg total) by mouth daily. 01/20/16   [name], NP  citalopram (CELEXA) 20 MG tablet Take 1 tablet (20 mg total) by mouth daily. 01/20/16   [name], NP  cyclobenzaprine (FLEXERIL) 5 MG tablet Take 1  tablet (5 mg total) by mouth 3 (three) times daily as needed for muscle spasms. 09/07/15   [name], NP  meloxicam (MOBIC) 7.5 MG tablet Take 1 tablet (7.5 mg total) by mouth daily. 09/07/15   [name], NP  Multiple Vitamins-Minerals (MULTIVITAMIN WITH MINERALS) tablet Take 1 tablet by mouth daily.    Historical Provider, MD  valsartan-hydrochlorothiazide (DIOVAN-HCT) 160-25 MG tablet Take 1 tablet by mouth daily. 01/20/16   [name], NP  venlafaxine XR (EFFEXOR-XR) 150 MG 24 hr capsule TAKE ONE CAPSULE BY MOUTH DAILY WITH BREAKFAST 02/09/16   [name], NP    Family History Family History  Problem Relation Age of Onset  . Heart disease Father     heart murmur  . Parkinson's disease Father   . Hypertension Mother   . Coronary artery disease Paternal Uncle     2 uncles (both died before 42)  . Coronary artery disease Paternal Aunt     MI, died before 40  . Melanoma Brother 20    melanoma    Social History Social History  Substance Use Topics  . Smoking status: Never Smoker  . Smokeless tobacco: Never Used  . Alcohol use 0.0 oz/week     Comment: rare     Allergies   Ace inhibitors   Review of Systems Review of Systems  Constitutional: Negative for chills and fever.  Skin: Positive for wound.  Neurological: Negative  for numbness.  All other systems reviewed and are negative.    Physical Exam Updated Vital Signs BP 143/67 (BP Location: Left Arm)   Pulse 88   Temp 97.8 F (36.6 C) (Oral)   Resp 26   Ht 4\' 10"  (1.473 m)   Wt 82.6 kg   LMP 01/20/2016 (Approximate)   SpO2 100%   BMI 38.08 kg/m   Physical Exam  Constitutional: She appears well-developed and well-nourished. No distress.  HENT:  Head: Normocephalic and atraumatic.  Eyes: Conjunctivae and EOM are normal.  Neck: Neck supple.  Cardiovascular: Normal rate.   Pulmonary/Chest: Effort normal. No respiratory distress.  Musculoskeletal: Normal range of motion.        Hands: Avulsion wound to the right thumb. Full range of motion and good strength of the thumb.   Neurological: She is alert.  Skin: Skin is warm and dry. Laceration noted.  Open wound to the side of right thumb nail.  Psychiatric: Her mood appears anxious.  Nursing note and vitals reviewed.    ED Treatments / Results  DIAGNOSTIC STUDIES: Oxygen Saturation is 99% on RA, normal by my interpretation.    COORDINATION OF CARE: 9:40 PM Discussed treatment plan with pt at bedside which includes wound care and pt agreed to plan.  Labs (all labs ordered are listed, but only abnormal results are displayed) Labs Reviewed - No data to display   Radiology Dg Finger Thumb Right  Result Date: 02/23/2016 CLINICAL DATA:  45 year old female with laceration of the distal right thumb. EXAM: RIGHT THUMB 2+V COMPARISON:  None. FINDINGS: There is no acute fracture or dislocation. The bones are well mineralized. No arthritic changes. There is soft tissue swelling of the thumb. No radiopaque foreign object or soft tissue gas. IMPRESSION: No acute/traumatic osseous pathology. Electronically Signed   By: Anner Crete M.D.   On: 02/23/2016 22:22    Procedures .Nerve Block Date/Time: 02/23/2016 10:57 PM Performed by: Ashley Murrain Authorized by: Ashley Murrain   Consent:    Consent obtained:  Verbal   Consent given by:  Patient Indications:    Indications:  Procedural anesthesia Location:    Body area:  Upper extremity   Upper extremity nerve:  Metacarpal   Laterality:  Right Pre-procedure details:    Skin preparation:  Hibiclens   Preparation: Patient was prepped and draped in usual sterile fashion   Skin anesthesia (see MAR for exact dosages):    Skin anesthesia method:  Local infiltration   Local anesthetic:  Bupivacaine 0.5% w/o epi Procedure details (see MAR for exact dosages):    Block needle gauge:  27 G   Anesthetic injected:  Bupivacaine 0.5% w/o epi   Steroid injected:  None    Additive injected:  None   Injection procedure:  Anatomic landmarks identified Post-procedure details:    Dressing:  None   Outcome:  Anesthesia achieved   Patient tolerance of procedure:  Tolerated well, no immediate complications .Marland KitchenLaceration Repair Date/Time: 02/23/2016 11:35 PM Performed by: Ashley Murrain Authorized by: Ashley Murrain   Consent:    Consent obtained:  Verbal   Consent given by:  Patient   Risks discussed:  Pain   Alternatives discussed:  No treatment Anesthesia (see MAR for exact dosages):    Anesthesia method:  Nerve block   Block location:  Right thumb   Block needle gauge:  27 G   Block anesthetic:  Lidocaine 1% w/o epi and bupivacaine 0.25% w/o epi   Block injection  procedure:  Anatomic landmarks identified, introduced needle, incremental injection and negative aspiration for blood   Block outcome:  Anesthesia achieved Laceration details:    Location:  Finger   Finger location:  R thumb   Length (cm):  1 Pre-procedure details:    Preparation:  Patient was prepped and draped in usual sterile fashion and imaging obtained to evaluate for foreign bodies Exploration:    Wound exploration: wound explored through full range of motion and entire depth of wound probed and visualized     Contaminated: no   Treatment:    Area cleansed with:  Betadine and saline   Amount of cleaning:  Standard   Irrigation solution:  Sterile saline   Irrigation volume:  100 ccs   Irrigation method:  Pressure wash Skin repair:    Repair method: "WoundSeal" Post-procedure details:    Patient tolerance of procedure:  Tolerated well, no immediate complications Comments:     Surgical wound seal topical powder applied and bleeding stopped. Non-adherent pressure dressing applied.    (including critical care time)    Initial Impression / Assessment and Plan / ED Course  I have reviewed the triage vital signs and the nursing notes.  Pertinent imaging results that were available  during my care of the patient were reviewed by me and considered in my medical decision making (see chart for details).  Clinical Course   45 y.o. female with laceration of the right thumb stable for d/c without focal neuro deficits and no fracture/dislocation noted on x-ray. She will return for any problems.   Final Clinical Impressions(s) / ED Diagnoses   Final diagnoses:  Laceration of skin of right thumb, initial encounter    New Prescriptions Discharge Medication List as of 02/23/2016 11:52 PM     I personally performed the services described in this documentation, which was scribed in my presence. The recorded information has been reviewed and is accurate.    [address] [place], NP 02/24/16 [id number]    Gareth Morgan, MD 02/25/16 [phone]

## 2016-02-23 NOTE — ED Triage Notes (Signed)
Patient accidentally sliced her right distal thumb with a meat slicer at work this afternoon , dressing applied at triage with moderate bleeding.

## 2016-02-23 NOTE — ED Notes (Signed)
Transported to xray 

## 2016-02-23 NOTE — ED Notes (Signed)
After medications given by NP pat expressed feeling of SOB, numbness of fingers in other hand. Seems very anxious and tearful. . Emotion support was given, Pt reported feeling a little better. No SOB noted. Georgina Snell, RN is at the bedside.

## 2016-02-23 NOTE — Discharge Instructions (Signed)
Keep your thumb clean and dry. If you have any problems you can follow up with Dr. Grandville Silos (the hand surgeon) or return here.

## 2016-02-24 NOTE — ED Notes (Signed)
Pt verbalized understanding of d/c instructions and has no further questions. Pt is stable, A&Ox4, VSS.  

## 2016-02-26 ENCOUNTER — Ambulatory Visit (INDEPENDENT_AMBULATORY_CARE_PROVIDER_SITE_OTHER): Payer: Managed Care, Other (non HMO) | Admitting: Allergy

## 2016-02-26 ENCOUNTER — Encounter: Payer: Self-pay | Admitting: Allergy

## 2016-02-26 VITALS — BP 110/74 | HR 88 | Resp 14 | Ht <= 58 in | Wt 180.0 lb

## 2016-02-26 DIAGNOSIS — H1010 Acute atopic conjunctivitis, unspecified eye: Secondary | ICD-10-CM | POA: Diagnosis not present

## 2016-02-26 DIAGNOSIS — L2389 Allergic contact dermatitis due to other agents: Secondary | ICD-10-CM | POA: Diagnosis not present

## 2016-02-26 DIAGNOSIS — J309 Allergic rhinitis, unspecified: Secondary | ICD-10-CM | POA: Diagnosis not present

## 2016-02-26 DIAGNOSIS — T783XXA Angioneurotic edema, initial encounter: Secondary | ICD-10-CM

## 2016-02-26 MED ORDER — OLOPATADINE HCL 0.7 % OP SOLN: 1.0000 [drp] | Freq: Every day | OPHTHALMIC | 3 refills | Status: DC

## 2016-02-26 MED ORDER — TRIAMCINOLONE ACETONIDE 0.5 % EX OINT: 1.0000 "application " | TOPICAL_OINTMENT | Freq: Two times a day (BID) | CUTANEOUS | 0 refills | Status: DC

## 2016-02-26 NOTE — Addendum Note (Signed)
Addended by: Gara Kroner L on: 02/26/2016 03:31 PM   Modules accepted: Orders

## 2016-02-26 NOTE — Progress Notes (Signed)
New Patient Note  RE: Wanda Williams MRN: [id number] DOB: 1970-04-04 Date of Office Visit: 02/26/2016  Referring provider: [name], NP Primary care provider: [name]., NP  Chief Complaint: eye swelling  History of present illness: Wanda Williams is a 45 y.o. female presenting today for consultation for periorbital swelling.  She states she had an allergic reaction where her eyes swelled shut on 01/08/16.  She had a 'bad' day at work and she went to bed and woke up with a headache.  She took Excedrin and went back to sleep and woke up several hours later and couldn't open her eyes.  She injured initially today that is consistent with periorbital swelling bilaterally.  She saw a provider at her doctor's office and was told she had an allergic reaction and gave her a steroid injection. She feels it took almost a week for the swelling to fully resolve. She denies any other symptoms with her swelling including no urticaria, no GI, respiratory or CV related symptoms.  She denies remember eating any red meat the night prior to the reaction. 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  She states she is now concerned that maybe she was reacting to the clothing that was coming into contact with her eye as she was rubbing them.  She does report that her eyes itch and turn red a lot of the times.   She reports she rarely takes other NSAIDs like ibuprofen and has never had any problems before this.   She does throat clear often throughout the day, occasional stuffy nose in the mornings.  She has never tried anything for these symptoms.  She does not like sticking things up her nose.    She has had eczema her entire life.  She uses Eucerin for moisturization.  She currently does not use any topical steroid cream.  Her  hands are her biggest problem areas.  She currently has a rash on her abdomen that is around the area of her button buckle.  No food allergy history or asthma history.    Review of systems: Review of Systems  Constitutional: Negative for chills, fever and malaise/fatigue.  HENT: Negative for congestion, ear pain, sinus pain and sore throat.   Eyes: Positive for redness. Negative for discharge.  Respiratory: Negative for cough, shortness of breath and wheezing.   Cardiovascular: Negative for chest pain.  Gastrointestinal: Negative for heartburn, nausea and vomiting.  Skin: Positive for itching and rash.    All other systems negative unless noted above in HPI  Past medical history: Past Medical History:  Diagnosis Date  . Anxiety   . Depression   . Eczema   . HTN (hypertension)     Past surgical history: Past Surgical History:  Procedure Laterality Date  . CESAREAN SECTION    . DILATION AND CURETTAGE OF UTERUS    . THYROID CYST EXCISION    . TUBAL LIGATION  2006    Family history:  Family History  Problem Relation Age of Onset  . Heart disease Father     heart murmur  . Parkinson's disease Father   . Hypertension Mother   . Coronary artery disease Paternal Uncle     2 uncles (both died before 2)  . Coronary artery disease Paternal Aunt  MI, died before 34  . Melanoma Brother 55    melanoma  . Allergic rhinitis Sister     Social history:  Social History Narrative   She lives in a town home with her family with carpeting in the bedroom with electric heating and central cooling. There is a [place] pig and dog in the home. There is no concern for were damage or mildew or roaches in the home. Works at [employer]    Medication List: Allergies as of 02/26/2016      Reactions   Ace Inhibitors Cough      Medication List       Accurate as of 02/26/16 12:33 PM. Always use your most recent med list.          aspirin-acetaminophen-caffeine 250-250-65 MG  tablet Commonly known as:  EXCEDRIN MIGRAINE Take by mouth every 6 (six) hours as needed for headache.   betamethasone dipropionate 0.05 % cream Commonly known as:  DIPROLENE Apply topically 2 (two) times daily.   citalopram 20 MG tablet Commonly known as:  CELEXA Take 1 tablet (20 mg total) by mouth daily.   meloxicam 7.5 MG tablet Commonly known as:  MOBIC Take 1 tablet (7.5 mg total) by mouth daily.   multivitamin with minerals tablet Take 1 tablet by mouth daily.   valsartan-hydrochlorothiazide 160-25 MG tablet Commonly known as:  DIOVAN-HCT Take 1 tablet by mouth daily.       Known medication allergies: Allergies  Allergen Reactions  . Ace Inhibitors Cough     Physical examination: Blood pressure 110/74, pulse 88, resp. rate 14, height 4\' 10"  (1.473 m), weight 180 lb (81.6 kg), last menstrual period 01/20/2016, SpO2 98 %.  General: Alert, interactive, in no acute distress. HEENT: TMs pearly gray, turbinates minimally edematous without discharge, post-pharynx non erythematous. Neck: Supple without lymphadenopathy. Lungs: Clear to auscultation without wheezing, rhonchi or rales. {no increased work of breathing. CV: Normal S1, S2 without murmurs. Abdomen: Nondistended, nontender. Skin: Erythematous scaly-like rash on her abdomen around her umbilical area. Extremities:  No clubbing, cyanosis or edema. Neuro:   Grossly intact.  Diagnositics/Labs:  Allergy testing: on skin prick testing mildly reactive to grasses, molds. On intradermal testing mildly reactive to mite mix Allergy testing results were read and interpreted by provider, documented by clinical staff.   Assessment and plan:    Angioedema   - She has had one episode of angioedema without any clear trigger.  Recent Excedrin use with aspirin could have been a trigger as NSAIDs are known mass so releasing agents.  It is also likely that she was reacting to dust mite exposure given her sensitivity.  If she  were to have recurrent episodes of swelling alone will be concern for possible acquired angioedema or HAE at which time would obtain lab work to assess for this.  Should significant symptoms recur or new symptoms occur, a journal is to be kept recording any foods eaten, beverages consumed, medications taken, activities performed, and environmental conditions within a 6 hour time period prior to the onset of symptoms.   Allergic rhinoconjunctivitis  - Allergy testing was positive for grasses, weeds, trees, molds, dust mites.    - Allergen avoidance measures discussed today.   - Recommend taking Zyrtec 10mg  daily as needed for nasal congestion or drainage, itchy/watery/red eyes, sneezing.   - Use Pazeo 1 drop each eye as needed daily for itchy/watery/red eyes.   Possible contact dermatitis   - Rash today around her umbilical area in  the area in contact with her hands button may indicate a nickel sensitivity.   She also is concerned about possible fabric dye that came into contact with her eyes on the day prior of her periorbital swelling could possibly indicate a delay contact type reaction.  - We'll recommend patch testing to assess for contact dermatitis. Advised that she should be steroid free for at least 2-3 months before patch test placement.  - She may use topical steroid cream apply triamcinolone to help with her rash  Follow-up 6 months  I appreciate the opportunity to take part in Sora's care. Please do not hesitate to contact me with questions.  Sincerely,   Prudy Feeler, MD Allergy/Immunology [facility]

## 2016-02-26 NOTE — Patient Instructions (Signed)
Allergy testing was positive for grasses, weeds, trees, molds, dust mites.    Allergen avoidance measures discussed today.   Recommend taking Zyrtec 10mg  daily as needed for nasal congestion or drainage, itchy/watery/red eyes, sneezing.   Use Pazeo 1 drop each eye as needed daily for itchy/watery/red eyes.   Should significant symptoms recur or new symptoms occur, a journal is to be kept recording any foods eaten, beverages consumed, medications taken, activities performed, and environmental conditions within a 6 hour time period prior to the onset of symptoms.   Follow-up 6 months

## 2016-03-03 ENCOUNTER — Ambulatory Visit (INDEPENDENT_AMBULATORY_CARE_PROVIDER_SITE_OTHER): Payer: Managed Care, Other (non HMO) | Admitting: Licensed Clinical Social Worker

## 2016-03-03 ENCOUNTER — Ambulatory Visit: Payer: Managed Care, Other (non HMO) | Admitting: Licensed Clinical Social Worker

## 2016-03-03 DIAGNOSIS — F331 Major depressive disorder, recurrent, moderate: Secondary | ICD-10-CM | POA: Diagnosis not present

## 2016-03-17 ENCOUNTER — Ambulatory Visit (INDEPENDENT_AMBULATORY_CARE_PROVIDER_SITE_OTHER): Payer: Managed Care, Other (non HMO) | Admitting: Licensed Clinical Social Worker

## 2016-03-17 DIAGNOSIS — F331 Major depressive disorder, recurrent, moderate: Secondary | ICD-10-CM

## 2016-04-07 ENCOUNTER — Ambulatory Visit (INDEPENDENT_AMBULATORY_CARE_PROVIDER_SITE_OTHER): Payer: Managed Care, Other (non HMO) | Admitting: Licensed Clinical Social Worker

## 2016-04-07 DIAGNOSIS — F331 Major depressive disorder, recurrent, moderate: Secondary | ICD-10-CM

## 2016-04-14 ENCOUNTER — Ambulatory Visit (INDEPENDENT_AMBULATORY_CARE_PROVIDER_SITE_OTHER): Payer: Managed Care, Other (non HMO) | Admitting: Licensed Clinical Social Worker

## 2016-04-14 DIAGNOSIS — F3341 Major depressive disorder, recurrent, in partial remission: Secondary | ICD-10-CM

## 2016-04-16 ENCOUNTER — Other Ambulatory Visit: Payer: Self-pay | Admitting: Family

## 2016-04-28 ENCOUNTER — Ambulatory Visit (INDEPENDENT_AMBULATORY_CARE_PROVIDER_SITE_OTHER): Payer: Managed Care, Other (non HMO) | Admitting: Licensed Clinical Social Worker

## 2016-04-28 DIAGNOSIS — F331 Major depressive disorder, recurrent, moderate: Secondary | ICD-10-CM

## 2016-05-03 ENCOUNTER — Encounter: Payer: Self-pay | Admitting: Family

## 2016-05-03 ENCOUNTER — Ambulatory Visit (INDEPENDENT_AMBULATORY_CARE_PROVIDER_SITE_OTHER): Payer: Managed Care, Other (non HMO) | Admitting: Family

## 2016-05-03 VITALS — BP 119/79 | HR 82 | Temp 97.8°F | Resp 16 | Ht <= 58 in | Wt 188.8 lb

## 2016-05-03 DIAGNOSIS — F418 Other specified anxiety disorders: Secondary | ICD-10-CM | POA: Diagnosis not present

## 2016-05-03 DIAGNOSIS — I10 Essential (primary) hypertension: Secondary | ICD-10-CM | POA: Diagnosis not present

## 2016-05-03 DIAGNOSIS — R928 Other abnormal and inconclusive findings on diagnostic imaging of breast: Secondary | ICD-10-CM

## 2016-05-03 DIAGNOSIS — M7662 Achilles tendinitis, left leg: Secondary | ICD-10-CM

## 2016-05-03 MED ORDER — MELOXICAM 7.5 MG PO TABS: 7.5000 mg | ORAL_TABLET | Freq: Every day | ORAL | 0 refills | Status: DC

## 2016-05-03 NOTE — Addendum Note (Signed)
Addended by: Hinton Dyer on: 05/03/2016 10:52 AM   Modules accepted: Orders

## 2016-05-03 NOTE — Progress Notes (Signed)
Subjective:    Patient ID: Wanda Williams, female    DOB: 02-26-1971, 46 y.o.   MRN: [id number]  HPI  Wanda Williams is a 46 yr old female who presents today for follow up.  hypertension- Her current BP meds include diovan- HCT.  Denies CP/SOB, swelling.   BP Readings from Last 3 Encounters:  05/03/16 119/79  02/26/16 110/74  02/23/16 143/67   Depression- currently maintained on citalopram.  Reports mood is good.  Reports that she sleeps a lot   She is working with [employer]- therapist and feels that this is helping.   Has some pain in the left achilles.    Review of Systems See HPI  Past Medical History:  Diagnosis Date  . Anxiety   . Depression   . Eczema   . HTN (hypertension)      Social History   Social History  . Marital status: Married    Spouse name: N/A  . Number of children: N/A  . Years of education: N/A   Occupational History  . Not on file.   Social History Main Topics  . Smoking status: Never Smoker  . Smokeless tobacco: Never Used  . Alcohol use 0.0 oz/week     Comment: rare  . Drug use: No  . Sexual activity: Yes   Other Topics Concern  . Not on file   Social History Narrative   Works at [employer]   One daughter age 3   One daughter is age 24   Enjoys cross stitching, playing games on her cell phone.     2 dogs   Completed 12th grade.     Past Surgical History:  Procedure Laterality Date  . CESAREAN SECTION    . DILATION AND CURETTAGE OF UTERUS    . THYROID CYST EXCISION    . TUBAL LIGATION  2006    Family History  Problem Relation Age of Onset  . Heart disease Father     heart murmur  . Parkinson's disease Father   . Hypertension Mother   . Coronary artery disease Paternal Uncle     2 uncles (both died before 46)  . Coronary artery disease Paternal Aunt     MI, died before 61  . Melanoma Brother 23    melanoma  . Allergic rhinitis Sister     Allergies  Allergen Reactions  . Ace Inhibitors Cough    Current  Outpatient Prescriptions on File Prior to Visit  Medication Sig Dispense Refill  . aspirin-acetaminophen-caffeine (EXCEDRIN MIGRAINE) 250-250-65 MG tablet Take by mouth every 6 (six) hours as needed for headache.    . betamethasone dipropionate (DIPROLENE) 0.05 % cream Apply topically 2 (two) times daily. (Patient taking differently: Apply topically 2 (two) times daily as needed. ) 30 g 0  . citalopram (CELEXA) 20 MG tablet Take 1 tablet (20 mg total) by mouth daily. 30 tablet 3  . meloxicam (MOBIC) 7.5 MG tablet Take 1 tablet (7.5 mg total) by mouth daily. 14 tablet 0  . Multiple Vitamins-Minerals (MULTIVITAMIN WITH MINERALS) tablet Take 1 tablet by mouth daily.    . Olopatadine HCl (PAZEO) 0.7 % SOLN Apply 1 drop to eye daily. 2.5 mL 3  . triamcinolone ointment (KENALOG) 0.5 % Apply 1 application topically 2 (two) times daily. 30 g 0  . valsartan-hydrochlorothiazide (DIOVAN-HCT) 160-25 MG tablet TAKE ONE TABLET BY MOUTH DAILY 30 tablet 1   No current facility-administered medications on file prior to visit.  BP 119/79 (BP Location: Right Arm, Patient Position: Sitting, Cuff Size: Large)   Pulse 82   Temp 97.8 F (36.6 C) (Oral)   Resp 16   Ht 4\' 10"  (1.473 m)   Wt 188 lb 12.8 oz (85.6 kg)   LMP 04/20/2016   SpO2 98%   BMI 39.46 kg/m       Objective:   Physical Exam  Constitutional: She is oriented to person, place, and time. She appears well-developed and well-nourished.  HENT:  Head: Normocephalic and atraumatic.  Cardiovascular: Normal rate, regular rhythm and normal heart sounds.   No murmur heard. Pulmonary/Chest: Effort normal and breath sounds normal. No respiratory distress. She has no wheezes.  Musculoskeletal: She exhibits no edema.  + tenderness to palpation of left achilles tendon.   Neurological: She is alert and oriented to person, place, and time.  Psychiatric: She has a normal mood and affect. Her behavior is normal. Judgment and thought content normal.           Assessment & Plan:  Achilles Tendonitis- rx with meloxicam, advised icing bid, advised good supportive tennis shoes. If pain fails to improve, will plan referral to sports medicine.

## 2016-05-03 NOTE — Assessment & Plan Note (Signed)
Stable on citalopram. Continue citalopram and counseling.

## 2016-05-03 NOTE — Progress Notes (Signed)
Pre visit review using our clinic review tool, if applicable. No additional management support is needed unless otherwise documented below in the visit note. 

## 2016-05-03 NOTE — Assessment & Plan Note (Signed)
BP is stable on current medications. Continue same.  

## 2016-05-03 NOTE — Patient Instructions (Signed)
Begin meloxicam once daily for your left achilles pain. Continue citalopram and work with Marya Amsler for your depression. Blood pressure looks great, keep up the good work.

## 2016-05-19 ENCOUNTER — Ambulatory Visit (INDEPENDENT_AMBULATORY_CARE_PROVIDER_SITE_OTHER): Payer: Managed Care, Other (non HMO) | Admitting: Licensed Clinical Social Worker

## 2016-05-19 DIAGNOSIS — F331 Major depressive disorder, recurrent, moderate: Secondary | ICD-10-CM | POA: Diagnosis not present

## 2016-05-23 ENCOUNTER — Other Ambulatory Visit: Payer: Self-pay | Admitting: Family

## 2016-06-16 ENCOUNTER — Other Ambulatory Visit: Payer: Self-pay | Admitting: Family

## 2016-06-16 ENCOUNTER — Ambulatory Visit
Admission: RE | Admit: 2016-06-16 | Discharge: 2016-06-16 | Disposition: A | Discharge status: Home / Self Care | Payer: Managed Care, Other (non HMO) | Source: Ambulatory Visit | Attending: Family | Admitting: Family

## 2016-06-16 DIAGNOSIS — R928 Other abnormal and inconclusive findings on diagnostic imaging of breast: Secondary | ICD-10-CM

## 2016-06-16 DIAGNOSIS — N6320 Unspecified lump in the left breast, unspecified quadrant: Secondary | ICD-10-CM

## 2016-06-18 ENCOUNTER — Other Ambulatory Visit: Payer: Self-pay | Admitting: Family

## 2016-06-20 NOTE — Telephone Encounter (Signed)
eScribe request from Kristopher Oppenheim for refill on Diovan-HCTZ Last filled - 04/18/16, #30x1 Last AEX - 05/03/16 Refill sent per [facility] refill protocol/SLS

## 2016-06-21 ENCOUNTER — Other Ambulatory Visit: Payer: Self-pay | Admitting: Family

## 2016-06-21 DIAGNOSIS — N6320 Unspecified lump in the left breast, unspecified quadrant: Secondary | ICD-10-CM

## 2016-06-23 ENCOUNTER — Ambulatory Visit
Admission: RE | Admit: 2016-06-23 | Discharge: 2016-06-23 | Disposition: A | Discharge status: Home / Self Care | Payer: Managed Care, Other (non HMO) | Source: Ambulatory Visit | Attending: Family | Admitting: Family

## 2016-06-23 ENCOUNTER — Ambulatory Visit (INDEPENDENT_AMBULATORY_CARE_PROVIDER_SITE_OTHER): Payer: Managed Care, Other (non HMO) | Admitting: Licensed Clinical Social Worker

## 2016-06-23 ENCOUNTER — Other Ambulatory Visit: Payer: Self-pay | Admitting: Family

## 2016-06-23 DIAGNOSIS — N6320 Unspecified lump in the left breast, unspecified quadrant: Secondary | ICD-10-CM

## 2016-06-23 DIAGNOSIS — F331 Major depressive disorder, recurrent, moderate: Secondary | ICD-10-CM

## 2016-08-02 ENCOUNTER — Ambulatory Visit: Payer: Self-pay | Admitting: Family

## 2016-08-04 ENCOUNTER — Ambulatory Visit: Payer: Managed Care, Other (non HMO) | Admitting: Licensed Clinical Social Worker

## 2016-08-10 ENCOUNTER — Ambulatory Visit: Payer: Managed Care, Other (non HMO) | Admitting: Licensed Clinical Social Worker

## 2016-08-16 ENCOUNTER — Ambulatory Visit (INDEPENDENT_AMBULATORY_CARE_PROVIDER_SITE_OTHER): Payer: Managed Care, Other (non HMO) | Admitting: Licensed Clinical Social Worker

## 2016-08-16 DIAGNOSIS — F3341 Major depressive disorder, recurrent, in partial remission: Secondary | ICD-10-CM | POA: Diagnosis not present

## 2016-08-17 ENCOUNTER — Ambulatory Visit (INDEPENDENT_AMBULATORY_CARE_PROVIDER_SITE_OTHER): Payer: Managed Care, Other (non HMO) | Admitting: Adult Health

## 2016-08-17 ENCOUNTER — Encounter: Payer: Self-pay | Admitting: Adult Health

## 2016-08-17 VITALS — BP 122/76 | Temp 98.3°F | Ht <= 58 in | Wt 186.0 lb

## 2016-08-17 DIAGNOSIS — H65112 Acute and subacute allergic otitis media (mucoid) (sanguinous) (serous), left ear: Secondary | ICD-10-CM

## 2016-08-17 MED ORDER — FLUTICASONE PROPIONATE 50 MCG/ACT NA SUSP: 2.0000 | Freq: Every day | NASAL | 6 refills | Status: DC

## 2016-08-17 NOTE — Progress Notes (Signed)
Subjective:    Patient ID: Wanda Williams, female    DOB: 1970-06-21, 46 y.o.   MRN: [id number]  Was recently seen at [facility] in [place] and prescribed an 8 day course of Augmentin for perceived URI. She finished this three days ago. She reports feeling improved but continues to have pain in left ear    URI   This is a new problem. The current episode started in the past 7 days. The problem has been waxing and waning. There has been no fever. Associated symptoms include coughing, ear pain, a plugged ear sensation, a sore throat and swollen glands. Pertinent negatives include no sinus pain or wheezing.    Review of Systems  HENT: Positive for ear pain and sore throat. Negative for sinus pain and sinus pressure.   Respiratory: Positive for cough. Negative for chest tightness and wheezing.   Cardiovascular: Negative.   Neurological: Negative.    Past Medical History:  Diagnosis Date  . Anxiety   . Depression   . Eczema   . HTN (hypertension)     Social History   Social History  . Marital status: Married    Spouse name: N/A  . Number of children: N/A  . Years of education: N/A   Occupational History  . Not on file.   Social History Main Topics  . Smoking status: Never Smoker  . Smokeless tobacco: Never Used  . Alcohol use 0.0 oz/week     Comment: rare  . Drug use: No  . Sexual activity: Yes   Other Topics Concern  . Not on file   Social History Narrative   Works at [employer]   One daughter age 13   One daughter is age 48   Enjoys cross stitching, playing games on her cell phone.     2 dogs   Completed 12th grade.     Past Surgical History:  Procedure Laterality Date  . CESAREAN SECTION    . DILATION AND CURETTAGE OF UTERUS    . THYROID CYST EXCISION    . TUBAL LIGATION  2006    Family History  Problem Relation Age of Onset  . Heart disease Father        heart murmur  . Parkinson's disease Father   . Hypertension Mother   . Coronary artery  disease Paternal Uncle        2 uncles (both died before 30)  . Coronary artery disease Paternal Aunt        MI, died before 17  . Melanoma Brother 24       melanoma  . Allergic rhinitis Sister     Allergies  Allergen Reactions  . Ace Inhibitors Cough    Current Outpatient Prescriptions on File Prior to Visit  Medication Sig Dispense Refill  . aspirin-acetaminophen-caffeine (EXCEDRIN MIGRAINE) 250-250-65 MG tablet Take by mouth every 6 (six) hours as needed for headache.    . betamethasone dipropionate (DIPROLENE) 0.05 % cream Apply topically 2 (two) times daily. (Patient taking differently: Apply topically 2 (two) times daily as needed. ) 30 g 0  . citalopram (CELEXA) 20 MG tablet TAKE ONE TABLET BY MOUTH DAILY 30 tablet 2  . meloxicam (MOBIC) 7.5 MG tablet Take 1 tablet (7.5 mg total) by mouth daily. 14 tablet 0  . Multiple Vitamins-Minerals (MULTIVITAMIN WITH MINERALS) tablet Take 1 tablet by mouth daily.    . Olopatadine HCl (PAZEO) 0.7 % SOLN Apply 1 drop to eye daily. 2.5 mL 3  .  triamcinolone ointment (KENALOG) 0.5 % Apply 1 application topically 2 (two) times daily. 30 g 0  . valsartan-hydrochlorothiazide (DIOVAN-HCT) 160-25 MG tablet TAKE ONE TABLET BY MOUTH DAILY 30 tablet 1   No current facility-administered medications on file prior to visit.     BP 122/76 (BP Location: Left Arm, Patient Position: Sitting, Cuff Size: Normal)   Temp 98.3 F (36.8 C) (Oral)   Ht 4\' 10"  (1.473 m)   Wt 186 lb (84.4 kg)   BMI 38.87 kg/m       Objective:   Physical Exam  Constitutional: She is oriented to person, place, and time. She appears well-developed and well-nourished. No distress.  HENT:  Head: Normocephalic and atraumatic.  Right Ear: External ear normal.  Left Ear: External ear normal.  Nose: Nose normal.  Mouth/Throat: Oropharynx is clear and moist. No oropharyngeal exudate.  Fluid behind bilateral TM's. No signs of infection   Neck: Normal range of motion. Neck  supple.  Cardiovascular: Normal rate, regular rhythm, normal heart sounds and intact distal pulses.  Exam reveals no gallop and no friction rub.   No murmur heard. Pulmonary/Chest: Effort normal and breath sounds normal. No respiratory distress. She has no wheezes. She has no rales. She exhibits no tenderness.  Lymphadenopathy:    She has cervical adenopathy.  Neurological: She is alert and oriented to person, place, and time.  Skin: Skin is warm and dry. No rash noted. She is not diaphoretic. No erythema. No pallor.  Psychiatric: She has a normal mood and affect. Her behavior is normal. Judgment and thought content normal.  Nursing note and vitals reviewed.     Assessment & Plan:  1. Acute allergic otitis media of left ear, recurrence not specified - In light of recent abx use. She has no signs of acute infection.  - Advised Flonase to help with symptoms  - Follow up with PCP if symptoms do not resolve or if she develops a fever   Dorothyann Peng, NP

## 2016-08-26 ENCOUNTER — Encounter: Payer: Self-pay | Admitting: Allergy

## 2016-08-26 ENCOUNTER — Ambulatory Visit (INDEPENDENT_AMBULATORY_CARE_PROVIDER_SITE_OTHER): Payer: Managed Care, Other (non HMO) | Admitting: Allergy

## 2016-08-26 VITALS — BP 114/74 | HR 88 | Resp 18 | Ht <= 58 in | Wt 185.0 lb

## 2016-08-26 DIAGNOSIS — H1010 Acute atopic conjunctivitis, unspecified eye: Secondary | ICD-10-CM

## 2016-08-26 DIAGNOSIS — J309 Allergic rhinitis, unspecified: Secondary | ICD-10-CM | POA: Diagnosis not present

## 2016-08-26 DIAGNOSIS — T783XXD Angioneurotic edema, subsequent encounter: Secondary | ICD-10-CM

## 2016-08-26 DIAGNOSIS — K068 Other specified disorders of gingiva and edentulous alveolar ridge: Secondary | ICD-10-CM | POA: Diagnosis not present

## 2016-08-26 DIAGNOSIS — L2389 Allergic contact dermatitis due to other agents: Secondary | ICD-10-CM

## 2016-08-26 NOTE — Patient Instructions (Addendum)
Continue allergy avoidance measures for grasses, weeds, trees, molds, dust mites.    Recommend taking Zyrtec 10mg  daily as needed for nasal congestion or drainage, itchy/watery/red eyes, sneezing.   Use Pazeo 1 drop each eye as needed daily for itchy/watery/red eyes.   Continue flonase 1 sprays each nostril twice a day.    Should significant symptoms of swelling recur or new symptoms occur, a journal is to be kept recording any foods eaten, beverages consumed, medications taken, activities performed, and environmental conditions within a 6 hour time period prior to the onset of symptoms.   Recommend seeing your dentist regarding the bump on the gum -- sooner if bump gets larger or painful.   Follow-up 1 year or sooner if needed

## 2016-08-26 NOTE — Progress Notes (Signed)
Follow-up Note  RE: Wanda Williams MRN: [id number] DOB: 1970/10/13 Date of Office Visit: 08/26/2016   History of present illness: Wanda Williams is a 46 y.o. female presenting today for follow-up of angioedema, allergic rhinoconjunctivitis. She was last seen in the office on 02/26/2016. She presents today with her husband and daughter. Since last visit she denies any significant swelling episodes. She states she did have some dryness of her eyes and she used in eyedrop like a rewetting drops for this. She states over [place] weekend while in [place] but glands in her throat or swollen she was having some trouble swallowing. She went to an urgent care and was prescribed amoxicillin which she states did improve her symptoms. She continued to have some ear pain mostly on the left ear and she went last week to see her doctor here is that she had fluid in her ears and advised that she use Flonase which she has been using one spray twice a day. She does feel it. Ear pain has improved things Flonase. She denies any significant allergy symptoms at this time and did take the Zyrtec samples provided at the last visit but does not have any more. She feels that Zyrtec was helpful when she was taking them. No significant issues with the rash at this time. She also states she recently bit her tongue at the tip and developed bump at the tip of her tongue.  Several days later she then noticed another bump on the gumline that is not painful and is not getting bigger but it is noticeable.    Review of systems: Review of Systems  Constitutional: Negative for chills, fever and malaise/fatigue.  HENT: Positive for ear pain and sore throat. Negative for congestion, ear discharge, hearing loss, nosebleeds, sinus pain and tinnitus.   Eyes: Negative for pain, discharge and redness.  Respiratory: Negative for cough, shortness of breath and wheezing.   Cardiovascular: Negative for chest pain.  Gastrointestinal:  Negative for abdominal pain, diarrhea, heartburn, nausea and vomiting.  Musculoskeletal: Negative for joint pain and myalgias.  Skin: Negative for itching and rash.  Neurological: Negative for headaches.    All other systems negative unless noted above in HPI  Past medical/social/surgical/family history have been reviewed and are unchanged unless specifically indicated below.  No changes  Medication List: Allergies as of 08/26/2016      Reactions   Ace Inhibitors Cough      Medication List       Accurate as of 08/26/16 11:19 AM. Always use your most recent med list.          aspirin-acetaminophen-caffeine 250-250-65 MG tablet Commonly known as:  EXCEDRIN MIGRAINE Take by mouth every 6 (six) hours as needed for headache.   betamethasone dipropionate 0.05 % cream Commonly known as:  DIPROLENE Apply topically 2 (two) times daily.   citalopram 20 MG tablet Commonly known as:  CELEXA TAKE ONE TABLET BY MOUTH DAILY   fluticasone 50 MCG/ACT nasal spray Commonly known as:  FLONASE Place 2 sprays into both nostrils daily.   meloxicam 7.5 MG tablet Commonly known as:  MOBIC Take 1 tablet (7.5 mg total) by mouth daily.   multivitamin with minerals tablet Take 1 tablet by mouth daily.   Olopatadine HCl 0.7 % Soln Commonly known as:  PAZEO Apply 1 drop to eye daily.   triamcinolone ointment 0.5 % Commonly known as:  KENALOG Apply 1 application topically 2 (two) times daily.   valsartan-hydrochlorothiazide 160-25 MG tablet  Commonly known as:  DIOVAN-HCT TAKE ONE TABLET BY MOUTH DAILY       Known medication allergies: Allergies  Allergen Reactions  . Ace Inhibitors Cough     Physical examination: Blood pressure 114/74, pulse 88, resp. rate 18, height 4\' 10"  (1.473 m), weight 185 lb (83.9 kg), SpO2 97 %.  General: Alert, interactive, in no acute distress. HEENT: PERRLA, TMs pearly gray with mild visible fluid in the left ear, turbinates minimally edematous  without discharge, post-pharynx non erythematous. There is a semi-are round nodule in her gumline in the roof of her mouth Neck: Supple without lymphadenopathy. Lungs: Clear to auscultation without wheezing, rhonchi or rales. {no increased work of breathing. CV: Normal S1, S2 without murmurs. Abdomen: Nondistended, nontender. Skin: Warm and dry, without lesions or rashes. Extremities:  No clubbing, cyanosis or edema. Neuro:   Grossly intact.  Diagnositics/Labs: none today  Assessment and plan:   Angioedema  -- improve she has not had any further episodes since her initial visit.  The glandular neck swelling is most likely reactive lymph nodes due to an illness that did improve with antibiotics. She does have some fluid in her left ear this may represent a residual ear infection that was treated by the amoxicillin. She has been asked to keep a journal recording any foods eaten, beverages consumed, Medications taken, activities performed and environmental conditions within a 6 hour timeframe if she has any recurrence of angioedema symptoms.  Allergic rhinoconjunctivitis  - Continue allergy avoidance measures for grasses, weeds, trees, molds, dust mites.    - Recommend taking Zyrtec 10mg  daily as needed for nasal congestion or drainage, itchy/watery/red eyes, sneezing.   - Use Pazeo 1 drop each eye as needed daily for itchy/watery/red eyes.   - Continue flonase 1 sprays each nostril twice a day.    Oral lesion  - Recommend seeing your dentist regarding the bump on the gum -- sooner if bump gets larger or painful.  She has an appointment in early July.  ?Contact dermatitis   - doing well for rash standpoint at this time. If she would like to have patch testing done in the future we can offer this service.  Follow-up 1 year or sooner if needed  I appreciate the opportunity to take part in Byanca's care. Please do not hesitate to contact me with questions.  Sincerely,   Prudy Feeler,  MD Allergy/Immunology [facility]

## 2016-09-03 ENCOUNTER — Other Ambulatory Visit: Payer: Self-pay | Admitting: Family

## 2016-09-05 NOTE — Telephone Encounter (Signed)
30 day supply of valsartan hctz sent to pharmacy. Pt last seen by PCP 04/2016 and has no future appts scheduled. Please advise when pt should be seen?

## 2016-09-05 NOTE — Telephone Encounter (Signed)
8/18 pls.

## 2016-09-06 NOTE — Telephone Encounter (Signed)
Appt scheduled for 10/11/16 at 8am and mychart message sent to pt.

## 2016-09-15 ENCOUNTER — Other Ambulatory Visit: Payer: Self-pay | Admitting: Family

## 2016-09-15 NOTE — Telephone Encounter (Addendum)
Received venlafaxine request from [employer]. Rx authorized in error as Venlafaxine is no longer on current medication list. Last OV 04/2016 states pt was stable on Citalopram. Called and spoke with Luther Parody at [facility] and cancelled Rx. Advised him if pt is requesting Rx then she needs to contact our office. He will let her know.

## 2016-09-15 NOTE — Addendum Note (Signed)
Addended by: Kelle Darting A on: 09/15/2016 05:46 PM   Modules accepted: Orders

## 2016-09-20 ENCOUNTER — Other Ambulatory Visit: Payer: Self-pay | Admitting: Family

## 2016-09-21 NOTE — Telephone Encounter (Signed)
Wanda Williams--pharmacy has requested venlafaxine twice now but it was no longer on her medication list. Last OV noted pt was stable on Citalopram.  Please advise if she should be taking venlafaxine too?

## 2016-09-24 NOTE — Telephone Encounter (Signed)
My notes indicate she should only be taking citalopram. Can you please contact the patient and see if she has been using effexor as well?

## 2016-09-26 NOTE — Telephone Encounter (Signed)
Pt tel # [phone]  Pt returned call and stated that will be on her way to work but that we can leave her a complete VM if we don't get in contact with her. Please advise.

## 2016-09-26 NOTE — Telephone Encounter (Signed)
Called patient left vmail for her to call the ofc back.    PC

## 2016-09-27 NOTE — Telephone Encounter (Signed)
She stated you gave her both together and yes she has been taking them both. She also mentioned that her therapist said that she was doing well on the medications that she was taking.      Please advise.   PC

## 2016-10-11 ENCOUNTER — Other Ambulatory Visit: Payer: Self-pay | Admitting: Family

## 2016-10-11 ENCOUNTER — Ambulatory Visit (INDEPENDENT_AMBULATORY_CARE_PROVIDER_SITE_OTHER): Payer: Managed Care, Other (non HMO) | Admitting: Family

## 2016-10-11 ENCOUNTER — Encounter: Payer: Self-pay | Admitting: Family

## 2016-10-11 VITALS — BP 122/82 | HR 77 | Temp 97.7°F | Ht 59.0 in | Wt 190.1 lb

## 2016-10-11 DIAGNOSIS — I10 Essential (primary) hypertension: Secondary | ICD-10-CM | POA: Diagnosis not present

## 2016-10-11 DIAGNOSIS — E669 Obesity, unspecified: Secondary | ICD-10-CM

## 2016-10-11 DIAGNOSIS — F329 Major depressive disorder, single episode, unspecified: Secondary | ICD-10-CM

## 2016-10-11 DIAGNOSIS — F32A Depression, unspecified: Secondary | ICD-10-CM

## 2016-10-11 LAB — BASIC METABOLIC PANEL
BUN: 19 mg/dL (ref 6–23)
CALCIUM: 8.8 mg/dL (ref 8.4–10.5)
CO2: 31 meq/L (ref 19–32)
CREATININE: 0.53 mg/dL (ref 0.40–1.20)
Chloride: 104 mEq/L (ref 96–112)
GFR: 130.56 mL/min (ref >60.00)
Glucose, Bld: 95 mg/dL (ref 70–99)
Potassium: 4 mEq/L (ref 3.5–5.1)
Sodium: 141 mEq/L (ref 135–145)

## 2016-10-11 MED ORDER — MELOXICAM 7.5 MG PO TABS: 7.5000 mg | ORAL_TABLET | Freq: Every day | ORAL | 0 refills | Status: DC

## 2016-10-11 MED ORDER — CITALOPRAM HYDROBROMIDE 10 MG PO TABS: ORAL_TABLET | ORAL | 0 refills | Status: DC

## 2016-10-11 NOTE — Patient Instructions (Addendum)
Please complete lab work prior to leaving. Add 30 minutes of exercise 5 days a week. Count calories using myfitness pal. Goal weight loss 1-2 pounds/week.  Taper off of citalopram per instructions. Call if you develop depression/mood issues while tapering.

## 2016-10-11 NOTE — Progress Notes (Signed)
Subjective:    Patient ID: Wanda Williams, female    DOB: 31-Jul-1970, 46 y.o.   MRN: [id number]  HPI   Ms. Wanda Williams is a 46 yr old female who presents today for follow up.  1) HTN- maintained on diovan hct. Reports good compliance with medication. BP Readings from Last 3 Encounters:  10/11/16 122/82  08/26/16 114/74  08/17/16 122/76   2) Depression- feels like she is still gaining weight.Reports that her mood is good. She is looking into another job. Has a lot of stress at her current job.  Wt Readings from Last 3 Encounters:  10/11/16 190 lb 2 oz (86.2 kg)  08/26/16 185 lb (83.9 kg)  08/17/16 186 lb (84.4 kg)      Review of Systems   See HPI  Past Medical History:  Diagnosis Date  . Angio-edema   . Anxiety   . Depression   . Eczema   . HTN (hypertension)      Social History   Social History  . Marital status: Married    Spouse name: N/A  . Number of children: N/A  . Years of education: N/A   Occupational History  . Not on file.   Social History Main Topics  . Smoking status: Never Smoker  . Smokeless tobacco: Never Used  . Alcohol use 0.0 oz/week     Comment: rare  . Drug use: No  . Sexual activity: Yes   Other Topics Concern  . Not on file   Social History Narrative   Works at [employer]   One daughter age 19   One daughter is age 47   Enjoys cross stitching, playing games on her cell phone.     2 dogs   Completed 12th grade.     Past Surgical History:  Procedure Laterality Date  . CESAREAN SECTION    . DILATION AND CURETTAGE OF UTERUS    . THYROID CYST EXCISION    . TUBAL LIGATION  2006    Family History  Problem Relation Age of Onset  . Heart disease Father        heart murmur  . Parkinson's disease Father   . Hypertension Mother   . Coronary artery disease Paternal Uncle        2 uncles (both died before 37)  . Coronary artery disease Paternal Aunt        MI, died before 71  . Melanoma Brother 58       melanoma  .  Allergic rhinitis Sister     Allergies  Allergen Reactions  . Ace Inhibitors Cough    Current Outpatient Prescriptions on File Prior to Visit  Medication Sig Dispense Refill  . aspirin-acetaminophen-caffeine (EXCEDRIN MIGRAINE) 250-250-65 MG tablet Take by mouth every 6 (six) hours as needed for headache.    . betamethasone dipropionate (DIPROLENE) 0.05 % cream Apply topically 2 (two) times daily. (Patient taking differently: Apply topically 2 (two) times daily as needed. ) 30 g 0  . fluticasone (FLONASE) 50 MCG/ACT nasal spray Place 2 sprays into both nostrils daily. 16 g 6  . Multiple Vitamins-Minerals (MULTIVITAMIN WITH MINERALS) tablet Take 1 tablet by mouth daily.    . Olopatadine HCl (PAZEO) 0.7 % SOLN Apply 1 drop to eye daily. 2.5 mL 3  . triamcinolone ointment (KENALOG) 0.5 % Apply 1 application topically 2 (two) times daily. 30 g 0  . valsartan-hydrochlorothiazide (DIOVAN-HCT) 160-25 MG tablet TAKE ONE TABLET BY MOUTH DAILY 30 tablet 0  .  venlafaxine XR (EFFEXOR-XR) 150 MG 24 hr capsule TAKE ONE CAPSULE BY MOUTH EVERY MORNING WITH BREAKFAST 30 capsule 4   No current facility-administered medications on file prior to visit.     BP 122/82 (BP Location: Left Arm, Patient Position: Sitting, Cuff Size: Large)   Pulse 77   Temp 97.7 F (36.5 C) (Oral)   Ht 4\' 11"  (1.499 m)   Wt 190 lb 2 oz (86.2 kg)   SpO2 97%   BMI 38.40 kg/m        Objective:   Physical Exam  Constitutional: She is oriented to person, place, and time. She appears well-developed and well-nourished.  Cardiovascular: Normal rate, regular rhythm and normal heart sounds.   No murmur heard. Pulmonary/Chest: Effort normal and breath sounds normal. No respiratory distress. She has no wheezes.  Neurological: She is alert and oriented to person, place, and time.  Psychiatric: She has a normal mood and affect. Her behavior is normal. Judgment and thought content normal.          Assessment & Plan:    Depression-she is concerned about ongoing weight gain. We discussed that citalopram may be contributing. She would like to try to taper off of citalopram. I am concerned about her mood off of the citalopram and I have advised her to call me if she has any issues during the taper. We will plan to continue her Effexor  HTN- stable on current meds continue same.  Obesity-we discussed healthy diet and regular exercise. Specifically I advised her to try logging her calories and setting up a plan using my fitness [place] with a goal weight loss of 1-2 pounds per week. 25 minutes was spent with the patient today. Greater than 50% of this time was spent counseling the patient on depression, healthy diet, exercise, and weight loss.

## 2016-10-11 NOTE — Progress Notes (Signed)
Pre visit review using our clinic review tool, if applicable. No additional management support is needed unless otherwise documented below in the visit note. 

## 2016-11-14 ENCOUNTER — Other Ambulatory Visit: Payer: Self-pay | Admitting: Family

## 2016-11-14 DIAGNOSIS — R921 Mammographic calcification found on diagnostic imaging of breast: Secondary | ICD-10-CM

## 2016-11-22 ENCOUNTER — Ambulatory Visit (INDEPENDENT_AMBULATORY_CARE_PROVIDER_SITE_OTHER): Payer: Managed Care, Other (non HMO) | Admitting: Family

## 2016-11-22 ENCOUNTER — Encounter: Payer: Self-pay | Admitting: Family

## 2016-11-22 VITALS — BP 116/83 | HR 82 | Temp 98.2°F | Resp 16 | Ht 59.0 in | Wt 187.0 lb

## 2016-11-22 DIAGNOSIS — F418 Other specified anxiety disorders: Secondary | ICD-10-CM

## 2016-11-22 DIAGNOSIS — Z23 Encounter for immunization: Secondary | ICD-10-CM

## 2016-11-22 MED ORDER — VENLAFAXINE HCL ER 150 MG PO CP24
ORAL_CAPSULE | ORAL | 1 refills | Status: DC
Start: 2016-11-22 — End: 2017-03-29

## 2016-11-22 MED ORDER — VALSARTAN-HYDROCHLOROTHIAZIDE 160-25 MG PO TABS: 1.0000 | ORAL_TABLET | Freq: Every day | ORAL | 1 refills | Status: DC

## 2016-11-22 MED ORDER — MELOXICAM 7.5 MG PO TABS: 7.5000 mg | ORAL_TABLET | Freq: Every day | ORAL | 0 refills | Status: DC

## 2016-11-22 NOTE — Progress Notes (Signed)
Subjective:    Patient ID: Wanda Williams, female    DOB: 30-Oct-1970, 46 y.o.   MRN: [id number]  HPI  Last visit she expressed concern re: weight gain and wished to taper off of citalopram. She was continued on effexor. Since last visit she her first grandchild was born (grandson "[place]). Reports that she is doing much better. She was able to transition off of citalopram without difficulty. She will be cutting down her hours from 40 hrs a week to 28 hours a week and moving from [employer] to [employer]. She is hopeful that this transition will help reduce her stress.  Her new employer does not offer insurance however.  She has been working on a [employer].  Wt Readings from Last 3 Encounters:  11/22/16 187 lb (84.8 kg)  10/11/16 190 lb 2 oz (86.2 kg)  08/26/16 185 lb (83.9 kg)      Review of Systems See HPI  Past Medical History:  Diagnosis Date  . Angio-edema   . Anxiety   . Depression   . Eczema   . HTN (hypertension)      Social History   Social History  . Marital status: Married    Spouse name: N/A  . Number of children: N/A  . Years of education: N/A   Occupational History  . Not on file.   Social History Main Topics  . Smoking status: Never Smoker  . Smokeless tobacco: Never Used  . Alcohol use 0.0 oz/week     Comment: rare  . Drug use: No  . Sexual activity: Yes   Other Topics Concern  . Not on file   Social History Narrative   Works at [employer]   One daughter age 71   One daughter is age 33   Enjoys cross stitching, playing games on her cell phone.     2 dogs   Completed 12th grade.     Past Surgical History:  Procedure Laterality Date  . CESAREAN SECTION    . DILATION AND CURETTAGE OF UTERUS    . THYROID CYST EXCISION    . TUBAL LIGATION  2006    Family History  Problem Relation Age of Onset  . Heart disease Father        heart murmur  . Parkinson's disease Father   . Hypertension Mother   . Coronary artery disease  Paternal Uncle        2 uncles (both died before 8)  . Coronary artery disease Paternal Aunt        MI, died before 42  . Melanoma Brother 31       melanoma  . Allergic rhinitis Sister     Allergies  Allergen Reactions  . Ace Inhibitors Cough    Current Outpatient Prescriptions on File Prior to Visit  Medication Sig Dispense Refill  . aspirin-acetaminophen-caffeine (EXCEDRIN MIGRAINE) 250-250-65 MG tablet Take by mouth every 6 (six) hours as needed for headache.    . betamethasone dipropionate (DIPROLENE) 0.05 % cream Apply topically 2 (two) times daily. (Patient taking differently: Apply topically 2 (two) times daily as needed. ) 30 g 0  . fluticasone (FLONASE) 50 MCG/ACT nasal spray Place 2 sprays into both nostrils daily. 16 g 6  . meloxicam (MOBIC) 7.5 MG tablet Take 1 tablet (7.5 mg total) by mouth daily. 14 tablet 0  . Multiple Vitamins-Minerals (MULTIVITAMIN WITH MINERALS) tablet Take 1 tablet by mouth daily.    . Olopatadine HCl (PAZEO) 0.7 %  SOLN Apply 1 drop to eye daily. 2.5 mL 3  . triamcinolone ointment (KENALOG) 0.5 % Apply 1 application topically 2 (two) times daily. 30 g 0  . valsartan-hydrochlorothiazide (DIOVAN-HCT) 160-25 MG tablet TAKE ONE TABLET BY MOUTH DAILY 30 tablet 0  . venlafaxine XR (EFFEXOR-XR) 150 MG 24 hr capsule TAKE ONE CAPSULE BY MOUTH EVERY MORNING WITH BREAKFAST 30 capsule 4   No current facility-administered medications on file prior to visit.     BP 116/83 (BP Location: Left Arm, Cuff Size: Large)   Pulse 82   Temp 98.2 F (36.8 C) (Oral)   Resp 16   Ht 4\' 11"  (1.499 m)   Wt 187 lb (84.8 kg)   SpO2 99%   BMI 37.77 kg/m       Objective:   Physical Exam  Constitutional: She is oriented to person, place, and time. She appears well-developed and well-nourished.  HENT:  Head: Normocephalic and atraumatic.  Cardiovascular: Normal rate, regular rhythm and normal heart sounds.   No murmur heard. Pulmonary/Chest: Effort normal and breath  sounds normal. No respiratory distress. She has no wheezes.  Musculoskeletal: She exhibits no edema.  Neurological: She is alert and oriented to person, place, and time.  Psychiatric: She has a normal mood and affect. Her behavior is normal. Judgment and thought content normal.          Assessment & Plan:

## 2016-11-22 NOTE — Assessment & Plan Note (Signed)
Stable on effexor alone.  Monitor. I commended her on her weight loss and changes to decrease her stress. She was seeing Marya Amsler- counselor, but she signed off since she is doing so well.     A total of 15 minutes were spent face-to-face with the patient during this encounter and over half of that time was spent on counseling and coordination of care. The patient was counseled on depression and treatment.

## 2016-11-29 ENCOUNTER — Other Ambulatory Visit: Payer: Self-pay | Admitting: Allergy

## 2016-11-29 DIAGNOSIS — L2389 Allergic contact dermatitis due to other agents: Secondary | ICD-10-CM

## 2016-12-06 ENCOUNTER — Ambulatory Visit (INDEPENDENT_AMBULATORY_CARE_PROVIDER_SITE_OTHER): Payer: Managed Care, Other (non HMO) | Admitting: Family

## 2016-12-06 ENCOUNTER — Other Ambulatory Visit (HOSPITAL_COMMUNITY)
Admission: RE | Admit: 2016-12-06 | Discharge: 2016-12-06 | Disposition: A | Discharge status: Home / Self Care | Payer: Managed Care, Other (non HMO) | Source: Ambulatory Visit | Attending: Family | Admitting: Family

## 2016-12-06 ENCOUNTER — Encounter: Payer: Self-pay | Admitting: Family

## 2016-12-06 VITALS — BP 138/94 | HR 76 | Temp 98.0°F | Resp 16 | Ht 59.0 in | Wt 190.0 lb

## 2016-12-06 DIAGNOSIS — Z0000 Encounter for general adult medical examination without abnormal findings: Secondary | ICD-10-CM | POA: Diagnosis not present

## 2016-12-06 DIAGNOSIS — Z01419 Encounter for gynecological examination (general) (routine) without abnormal findings: Secondary | ICD-10-CM | POA: Diagnosis not present

## 2016-12-06 DIAGNOSIS — N760 Acute vaginitis: Secondary | ICD-10-CM | POA: Diagnosis not present

## 2016-12-06 LAB — TSH: TSH: 3.23 u[IU]/mL (ref 0.35–4.50)

## 2016-12-06 LAB — HEPATIC FUNCTION PANEL
ALBUMIN: 3.7 g/dL (ref 3.5–5.2)
ALT: 32 U/L (ref 0–35)
AST: 23 U/L (ref 0–37)
Alkaline Phosphatase: 65 U/L (ref 39–117)
BILIRUBIN TOTAL: 0.2 mg/dL (ref 0.2–1.2)
Bilirubin, Direct: 0 mg/dL (ref 0.0–0.3)
Total Protein: 6.3 g/dL (ref 6.0–8.3)

## 2016-12-06 LAB — LIPID PANEL
CHOLESTEROL: 181 mg/dL (ref 0–200)
HDL: 49 mg/dL (ref >39.00)
LDL CALC: 106 mg/dL — AB (ref 0–99)
NonHDL: 131.51
TRIGLYCERIDES: 128 mg/dL (ref 0.0–149.0)
Total CHOL/HDL Ratio: 4
VLDL: 25.6 mg/dL (ref 0.0–40.0)

## 2016-12-06 LAB — URINALYSIS, ROUTINE W REFLEX MICROSCOPIC
Bilirubin Urine: NEGATIVE
HGB URINE DIPSTICK: NEGATIVE
KETONES UR: NEGATIVE
LEUKOCYTES UA: NEGATIVE
NITRITE: POSITIVE — AB
PH: 6 (ref 5.0–8.0)
RBC / HPF: NONE SEEN (ref 0–?)
Specific Gravity, Urine: >=1.03 — AB (ref 1.000–1.030)
Total Protein, Urine: NEGATIVE
URINE GLUCOSE: NEGATIVE
Urobilinogen, UA: 0.2 (ref 0.0–1.0)

## 2016-12-06 LAB — CBC WITH DIFFERENTIAL/PLATELET
Basophils Absolute: 0 10*3/uL (ref 0.0–0.1)
Basophils Relative: 0.5 % (ref 0.0–3.0)
EOS PCT: 3.1 % (ref 0.0–5.0)
Eosinophils Absolute: 0.2 10*3/uL (ref 0.0–0.7)
HCT: 37.5 % (ref 36.0–46.0)
Hemoglobin: 12.5 g/dL (ref 12.0–15.0)
LYMPHS ABS: 2.3 10*3/uL (ref 0.7–4.0)
Lymphocytes Relative: 35.3 % (ref 12.0–46.0)
MCHC: 33.2 g/dL (ref 30.0–36.0)
MCV: 91.8 fl (ref 78.0–100.0)
MONO ABS: 0.5 10*3/uL (ref 0.1–1.0)
MONOS PCT: 7.7 % (ref 3.0–12.0)
NEUTROS ABS: 3.5 10*3/uL (ref 1.4–7.7)
NEUTROS PCT: 53.4 % (ref 43.0–77.0)
PLATELETS: 391 10*3/uL (ref 150.0–400.0)
RBC: 4.08 Mil/uL (ref 3.87–5.11)
RDW: 13.6 % (ref 11.5–15.5)
WBC: 6.5 10*3/uL (ref 4.0–10.5)

## 2016-12-06 LAB — BASIC METABOLIC PANEL
BUN: 16 mg/dL (ref 6–23)
CO2: 33 meq/L — AB (ref 19–32)
Calcium: 8.7 mg/dL (ref 8.4–10.5)
Chloride: 104 mEq/L (ref 96–112)
Creatinine, Ser: 0.52 mg/dL (ref 0.40–1.20)
GFR: 133.38 mL/min (ref >60.00)
GLUCOSE: 78 mg/dL (ref 70–99)
POTASSIUM: 4.1 meq/L (ref 3.5–5.1)
SODIUM: 142 meq/L (ref 135–145)

## 2016-12-06 NOTE — Patient Instructions (Addendum)
Please complete lab work prior to leaving. Continue to work on [employer], exercise and weight loss.   Please schedule a routine eye exam.

## 2016-12-06 NOTE — Progress Notes (Signed)
Subjective:    Patient ID: Wanda Williams, female    DOB: [birth year], 46 y.o.   MRN: [id number]  HPI   Patient presents today for complete physical.  Immunizations:  Tetanus 11/10/10, flu shot up to date Diet: needs improvement Wt Readings from Last 3 Encounters:  12/06/16 190 lb (86.2 kg)  11/22/16 187 lb (84.8 kg)  10/11/16 190 lb 2 oz (86.2 kg)  Exercise: some walks Pap Smear:  01/22/14 Mammogram: 06/16/16 Vision: due Dental:  Up to date      Review of Systems  Constitutional: Negative for unexpected weight change.  HENT: Negative for rhinorrhea.        Reports some hearing concerns  Eyes: Negative for visual disturbance.  Respiratory: Negative for cough.   Cardiovascular:       Occasional mild LE edema after prolonged standing.   Gastrointestinal: Negative for constipation and diarrhea.  Genitourinary:       Reports some vaginal itching, improved today  Musculoskeletal:       Occasional back tension Occasional foot pain, relieved by meloxicam  Skin:       Eczema on palms of hand, uses steroid cream  Neurological: Negative for headaches.  Hematological: Negative for adenopathy.  Psychiatric/Behavioral:       Reports mood is good.     Past Medical History:  Diagnosis Date  . Angio-edema   . Anxiety   . Depression   . Eczema   . HTN (hypertension)      Social History   Social History  . Marital status: Married    Spouse name: N/A  . Number of children: N/A  . Years of education: N/A   Occupational History  . Not on file.   Social History Main Topics  . Smoking status: Never Smoker  . Smokeless tobacco: Never Used  . Alcohol use 0.0 oz/week     Comment: rare  . Drug use: No  . Sexual activity: Yes   Other Topics Concern  . Not on file   Social History Narrative   Works at [employer]   One daughter age 73   One daughter is age 45   Enjoys cross stitching, playing games on her cell phone.     2 dogs   Completed 12th grade.     Past  Surgical History:  Procedure Laterality Date  . CESAREAN SECTION    . DILATION AND CURETTAGE OF UTERUS    . THYROID CYST EXCISION    . TUBAL LIGATION  2006    Family History  Problem Relation Age of Onset  . Heart disease Father        heart murmur  . Parkinson's disease Father   . Hypertension Mother   . Coronary artery disease Paternal Uncle        2 uncles (both died before 20)  . Coronary artery disease Paternal Aunt        MI, died before 1  . Melanoma Brother 24       melanoma  . Allergic rhinitis Sister     Allergies  Allergen Reactions  . Ace Inhibitors Cough    Current Outpatient Prescriptions on File Prior to Visit  Medication Sig Dispense Refill  . aspirin-acetaminophen-caffeine (EXCEDRIN MIGRAINE) 250-250-65 MG tablet Take by mouth every 6 (six) hours as needed for headache.    . betamethasone dipropionate (DIPROLENE) 0.05 % cream Apply topically 2 (two) times daily. (Patient taking differently: Apply topically 2 (two) times daily as needed. ) 30  g 0  . fluticasone (FLONASE) 50 MCG/ACT nasal spray Place 2 sprays into both nostrils daily. 16 g 6  . meloxicam (MOBIC) 7.5 MG tablet Take 1 tablet (7.5 mg total) by mouth daily. 30 tablet 0  . Multiple Vitamins-Minerals (MULTIVITAMIN WITH MINERALS) tablet Take 1 tablet by mouth daily.    . Olopatadine HCl (PAZEO) 0.7 % SOLN Apply 1 drop to eye daily. 2.5 mL 3  . triamcinolone ointment (KENALOG) 0.5 % APPLY TO AFFECTED AREA(S) TWO TIMES A DAY 45 g 3  . valsartan-hydrochlorothiazide (DIOVAN-HCT) 160-25 MG tablet Take 1 tablet by mouth daily. 90 tablet 1  . venlafaxine XR (EFFEXOR-XR) 150 MG 24 hr capsule TAKE ONE CAPSULE BY MOUTH EVERY MORNING WITH BREAKFAST 90 capsule 1   No current facility-administered medications on file prior to visit.     BP (!) 130/98 (BP Location: Left Arm, Cuff Size: Large)   Pulse 76   Temp 98 F (36.7 C) (Oral)   Resp 16   Ht 4\' 11"  (1.499 m)   Wt 190 lb (86.2 kg)   SpO2 98%   BMI  38.38 kg/m        Objective:   Physical Exam  Physical Exam  Constitutional: She is oriented to person, place, and time. She appears well-developed and well-nourished. No distress.  HENT:  Head: Normocephalic and atraumatic.  Right Ear: Tympanic membrane and ear canal normal.  Left Ear: Tympanic membrane and ear canal normal.  Mouth/Throat: Oropharynx is clear and moist.  Eyes: Pupils are equal, round, and reactive to light. No scleral icterus.  Neck: Normal range of motion. No thyromegaly present.  Cardiovascular: Normal rate and regular rhythm.   No murmur heard. Pulmonary/Chest: Effort normal and breath sounds normal. No respiratory distress. He has no wheezes. She has no rales. She exhibits no tenderness.  Abdominal: Soft. Bowel sounds are normal. She exhibits no distension and no mass. There is no tenderness. There is no rebound and no guarding.  Musculoskeletal: She exhibits no edema.  Lymphadenopathy:    She has no cervical adenopathy.  Neurological: She is alert and oriented to person, place, and time. She has normal patellar reflexes. She exhibits normal muscle tone. Coordination normal.  Skin: Skin is warm and dry.  Psychiatric: She has a normal mood and affect. Her behavior is normal. Judgment and thought content normal.  Breasts: Examined lying Right: Without masses, retractions, discharge or axillary adenopathy.  Left: Without masses, retractions, discharge or axillary adenopathy.  Inguinal/mons: Normal without inguinal adenopathy  External genitalia: Normal  BUS/Urethra/Skene's glands: Normal  Bladder: Normal  Vagina: Normal, vaginal odor noted, no significant discharge Cervix: Normal  Uterus: normal in size, shape and contour. Midline and mobile  Adnexa/parametria:  Rt: Without masses or tenderness.  Lt: Without masses or tenderness.  Anus and perineum: Normal            Assessment & Plan:   Preventative care- discussed healthy diet, regular  exercise, weight loss.  Immunizations reviewed and up to date.  Pap performed. She did express interest in referral for hearing testing but wishes to wait until she has her new insurance.   Vaginitis- will send pap for ancillary testing for yeast and bacteria.        Assessment & Plan:  EKG tracing is personally reviewed.  EKG notes NSR.  No acute changes.  Appears unchanged when compared to previous EKG.

## 2016-12-09 LAB — CYTOLOGY - PAP
ADEQUACY: ABSENT
BACTERIAL VAGINITIS: NEGATIVE
CANDIDA VAGINITIS: NEGATIVE
Diagnosis: NEGATIVE
HPV: NOT DETECTED

## 2016-12-11 ENCOUNTER — Encounter: Payer: Self-pay | Admitting: Family

## 2016-12-25 ENCOUNTER — Other Ambulatory Visit: Payer: Self-pay | Admitting: Family

## 2016-12-26 NOTE — Telephone Encounter (Signed)
Requesting Meloxicam 7.5mg -Take 1 tablet by mouth daily. Last refill:11/22/16;#30,0 Last OV:12/06/16 Please advise.//AB/CMA

## 2017-03-01 ENCOUNTER — Emergency Department (HOSPITAL_COMMUNITY): Payer: Managed Care, Other (non HMO)

## 2017-03-01 ENCOUNTER — Encounter (HOSPITAL_COMMUNITY): Payer: Self-pay

## 2017-03-01 ENCOUNTER — Emergency Department (HOSPITAL_COMMUNITY)
Admission: EM | Admit: 2017-03-01 | Discharge: 2017-03-01 | Disposition: A | Discharge status: Home / Self Care | Payer: Managed Care, Other (non HMO) | Attending: Physician Assistant | Admitting: Physician Assistant

## 2017-03-01 DIAGNOSIS — I10 Essential (primary) hypertension: Secondary | ICD-10-CM | POA: Insufficient documentation

## 2017-03-01 DIAGNOSIS — J040 Acute laryngitis: Secondary | ICD-10-CM | POA: Insufficient documentation

## 2017-03-01 DIAGNOSIS — J069 Acute upper respiratory infection, unspecified: Secondary | ICD-10-CM | POA: Insufficient documentation

## 2017-03-01 DIAGNOSIS — R0789 Other chest pain: Secondary | ICD-10-CM

## 2017-03-01 DIAGNOSIS — B9789 Other viral agents as the cause of diseases classified elsewhere: Secondary | ICD-10-CM | POA: Insufficient documentation

## 2017-03-01 DIAGNOSIS — Z79899 Other long term (current) drug therapy: Secondary | ICD-10-CM | POA: Insufficient documentation

## 2017-03-01 LAB — CBC
HEMATOCRIT: 36.5 % (ref 36.0–46.0)
Hemoglobin: 12.1 g/dL (ref 12.0–15.0)
MCH: 30.4 pg (ref 26.0–34.0)
MCHC: 33.2 g/dL (ref 30.0–36.0)
MCV: 91.7 fL (ref 78.0–100.0)
Platelets: 390 10*3/uL (ref 150–400)
RBC: 3.98 MIL/uL (ref 3.87–5.11)
RDW: 14.1 % (ref 11.5–15.5)
WBC: 9.9 10*3/uL (ref 4.0–10.5)

## 2017-03-01 LAB — BASIC METABOLIC PANEL
Anion gap: 6 (ref 5–15)
BUN: 20 mg/dL (ref 6–20)
CALCIUM: 9 mg/dL (ref 8.9–10.3)
CO2: 29 mmol/L (ref 22–32)
Chloride: 103 mmol/L (ref 101–111)
Creatinine, Ser: 0.59 mg/dL (ref 0.44–1.00)
GFR calc Af Amer: >60 mL/min (ref >60)
GLUCOSE: 104 mg/dL — AB (ref 65–99)
POTASSIUM: 4 mmol/L (ref 3.5–5.1)
Sodium: 138 mmol/L (ref 135–145)

## 2017-03-01 LAB — I-STAT TROPONIN, ED: Troponin i, poc: 0 ng/mL (ref 0.00–0.08)

## 2017-03-01 LAB — I-STAT BETA HCG BLOOD, ED (MC, WL, AP ONLY): I-stat hCG, quantitative: <5 m[IU]/mL (ref <5)

## 2017-03-01 MED ORDER — GUAIFENESIN ER 1200 MG PO TB12
1.0000 | ORAL_TABLET | Freq: Two times a day (BID) | ORAL | 0 refills | Status: DC
Start: 2017-03-01 — End: 2017-10-03

## 2017-03-01 MED ORDER — PREDNISONE 50 MG PO TABS: 50.0000 mg | ORAL_TABLET | Freq: Every day | ORAL | 0 refills | Status: DC

## 2017-03-01 MED ORDER — ACETAMINOPHEN-CODEINE 120-12 MG/5ML PO SOLN: 10.0000 mL | ORAL | 0 refills | Status: DC | PRN

## 2017-03-01 NOTE — ED Notes (Signed)
Pt verbalized understanding of d.c instructions, nad, ambulatory upon d.c

## 2017-03-01 NOTE — ED Provider Notes (Signed)
[facility] EMERGENCY DEPARTMENT Provider Note   CSN: [id number] Arrival date & time: 03/01/17  1625     History   Chief Complaint Chief Complaint  Patient presents with  . Chest Pain    HPI Wanda Williams is a 46 y.o. female.  HPI Patient presents to the emergency department with a one-week history of cough congestion and laryngitis.  The patient states that she has had laryngitis for the last week.  She states she took some over-the-counter medications without relief of her symptoms.  Patient states nothing seems to make the condition better.  She states coughing makes her pain in her chest worse.  The patient states that the pain in her chest started after the coughing started.  Patient states she has not had any fevers.  The patient denies shortness of breath, headache,blurred vision, neck pain, fever weakness, numbness, dizziness, anorexia, edema, abdominal pain, nausea, vomiting, diarrhea, rash, back pain, dysuria, hematemesis, bloody stool, near syncope, or syncope. Past Medical History:  Diagnosis Date  . Angio-edema   . Anxiety   . Depression   . Eczema   . HTN (hypertension)     Patient Active Problem List   Diagnosis Date Noted  . Amenorrhea 03/18/2015  . Carpal tunnel syndrome 03/18/2015  . Preventative health care 01/22/2014  . Depression with anxiety 09/04/2013  . Hypertension 11/10/2010    Past Surgical History:  Procedure Laterality Date  . CESAREAN SECTION    . DILATION AND CURETTAGE OF UTERUS    . THYROID CYST EXCISION    . TUBAL LIGATION  2006    OB History    No data available       Home Medications    Prior to Admission medications   Medication Sig Start Date End Date Taking? Authorizing Provider  betamethasone dipropionate (DIPROLENE) 0.05 % cream Apply topically 2 (two) times daily. Patient taking differently: Apply 1 application topically 2 (two) times daily as needed (for irritated hands).  03/18/15  Yes [name], NP  fluticasone (FLONASE) 50 MCG/ACT nasal spray Place 2 sprays into both nostrils daily. Patient taking differently: Place 2 sprays into both nostrils daily as needed for allergies.  08/17/16  Yes Nafziger, Tommi Rumps, NP  meloxicam (MOBIC) 7.5 MG tablet TAKE ONE TABLET BY MOUTH DAILY Patient taking differently: Take 7.5 mg by mouth once a day 12/27/16  Yes [name], NP  Multiple Vitamins-Minerals (MULTIVITAMIN WITH MINERALS) tablet Take 1 tablet by mouth daily.   Yes [provider]  triamcinolone ointment (KENALOG) 0.5 % APPLY TO AFFECTED AREA(S) TWO TIMES A DAY Patient taking differently: Apply to affected areas two times a day as needed for irritation 11/29/16  Yes Padgett, Rae Halsted, MD  valsartan-hydrochlorothiazide (DIOVAN-HCT) 160-25 MG tablet Take 1 tablet by mouth daily. 11/22/16  Yes [name], NP  venlafaxine XR (EFFEXOR-XR) 150 MG 24 hr capsule TAKE ONE CAPSULE BY MOUTH EVERY MORNING WITH BREAKFAST Patient taking differently: Take 150 mg by mouth daily with breakfast.  11/22/16  Yes [name], NP  aspirin-acetaminophen-caffeine (EXCEDRIN MIGRAINE) [phone] MG tablet Take 1-2 tablets by mouth every 6 (six) hours as needed for headache or migraine.     [provider]  Olopatadine HCl (PAZEO) 0.7 % SOLN Apply 1 drop to eye daily. Patient not taking: Reported on 03/01/2017 02/26/16   Kennith Gain, MD    Family History Family History  Problem Relation Age of Onset  . Heart disease Father  heart murmur  . Parkinson's disease Father   . Hypertension Mother   . Coronary artery disease Paternal Uncle        2 uncles (both died before 81)  . Coronary artery disease Paternal Aunt        MI, died before 60  . Melanoma Brother 17       melanoma  . Allergic rhinitis Sister     Social History Social History   Tobacco Use  . Smoking status: Never Smoker  . Smokeless tobacco: Never Used  Substance Use  Topics  . Alcohol use: Yes    Alcohol/week: 0.0 oz    Comment: rare  . Drug use: No     Allergies   Ace inhibitors   Review of Systems Review of Systems  All other systems negative except as documented in the HPI. All pertinent positives and negatives as reviewed in the HPI. Physical Exam Updated Vital Signs BP (!) 148/98   Pulse 83   Temp 97.9 F (36.6 C) (Oral)   Resp 20   Ht 4\' 10"  (1.473 m)   Wt 86.2 kg (190 lb)   SpO2 96%   BMI 39.71 kg/m   Physical Exam  Constitutional: She is oriented to person, place, and time. She appears well-developed and well-nourished. No distress.  HENT:  Head: Normocephalic and atraumatic.  Mouth/Throat: Oropharynx is clear and moist.  Eyes: Pupils are equal, round, and reactive to light.  Neck: Normal range of motion. Neck supple.  Cardiovascular: Normal rate, regular rhythm and normal heart sounds. Exam reveals no gallop and no friction rub.  No murmur heard. Pulmonary/Chest: Effort normal and breath sounds normal. No stridor. No tachypnea. No respiratory distress. She has no decreased breath sounds. She has no wheezes. She has no rhonchi. She has no rales.  Abdominal: Soft. Bowel sounds are normal. She exhibits no distension. There is no tenderness.  Neurological: She is alert and oriented to person, place, and time. She exhibits normal muscle tone. Coordination normal.  Skin: Skin is warm and dry. Capillary refill takes less than 2 seconds. No rash noted. No erythema.  Psychiatric: She has a normal mood and affect. Her behavior is normal.  Nursing note and vitals reviewed.    ED Treatments / Results  Labs (all labs ordered are listed, but only abnormal results are displayed) Labs Reviewed  BASIC METABOLIC PANEL - Abnormal; Notable for the following components:      Result Value   Glucose, Bld 104 (*)    All other components within normal limits  CBC  I-STAT TROPONIN, ED  I-STAT BETA HCG BLOOD, ED ([facility], [facility], [facility] ONLY)     EKG  EKG Interpretation None       Radiology Dg Chest 2 View  Result Date: 03/01/2017 CLINICAL DATA:  Hoarseness, chest pain and cough for 1 week. History of angioedema, hypertension. EXAM: CHEST  2 VIEW COMPARISON:  Chest radiograph April 14, 2003 FINDINGS: Cardiomediastinal silhouette is normal. No pleural effusions or focal consolidations. RIGHT lung base granuloma versus pulmonary vessel en face. Trachea projects midline and there is no pneumothorax. Soft tissue planes and included osseous structures are non-suspicious. Mild degenerative change of the thoracic spine. IMPRESSION: No active cardiopulmonary disease. Electronically Signed   By: Elon Alas M.D.   On: 03/01/2017 17:13    Procedures Procedures (including critical care time)  Medications Ordered in ED Medications - No data to display   Initial Impression / Assessment and Plan / ED Course  I  have reviewed the triage vital signs and the nursing notes.  Pertinent labs & imaging results that were available during my care of the patient were reviewed by me and considered in my medical decision making (see chart for details).     Patient has a viral URI with laryngitis and chest wall discomfort.  Patient be treated for this.  Have advised patient to return here for any worsening her condition.  Told to follow-up with her primary care doctor.  Told to rest and increase her fluid intake.. Patient agrees the plan and all questions were answered  Final Clinical Impressions(s) / ED Diagnoses   Final diagnoses:  None    ED Discharge Orders    None       Rebeca Allegra 03/01/17 2136    Macarthur Critchley, MD 03/08/17 0007

## 2017-03-01 NOTE — ED Triage Notes (Signed)
Per Pt, Pt is coming from home with complaints of hoarseness, coughing, and chest pain that started last Thursday. Reports that she had an episode of vomiting before Thanksgiving where she would have severe stomach pain, dry heaving, and one episode of vomiting. Denies any productivity to cough.

## 2017-03-01 NOTE — Discharge Instructions (Signed)
Return here as needed.  Increase your fluid intake and rest as much as possible.  Follow-up with your primary care doctor.

## 2017-03-29 ENCOUNTER — Telehealth: Payer: Self-pay | Admitting: Family

## 2017-03-29 MED ORDER — VENLAFAXINE HCL ER 150 MG PO CP24: ORAL_CAPSULE | ORAL | 2 refills | Status: DC

## 2017-03-29 MED ORDER — VALSARTAN-HYDROCHLOROTHIAZIDE 160-25 MG PO TABS: 1.0000 | ORAL_TABLET | Freq: Every day | ORAL | 2 refills | Status: DC

## 2017-03-29 NOTE — Telephone Encounter (Signed)
Husband requesting refills for wife who is out of insurance until April. Rx's given to husband.

## 2017-07-25 ENCOUNTER — Encounter (HOSPITAL_COMMUNITY): Payer: Self-pay | Admitting: Emergency Medicine

## 2017-07-25 ENCOUNTER — Other Ambulatory Visit: Payer: Self-pay

## 2017-07-25 ENCOUNTER — Emergency Department (HOSPITAL_COMMUNITY): Payer: Managed Care, Other (non HMO)

## 2017-07-25 ENCOUNTER — Emergency Department (HOSPITAL_COMMUNITY)
Admission: EM | Admit: 2017-07-25 | Discharge: 2017-07-25 | Disposition: A | Discharge status: Home / Self Care | Payer: Managed Care, Other (non HMO) | Attending: Emergency Medicine | Admitting: Emergency Medicine

## 2017-07-25 ENCOUNTER — Ambulatory Visit: Payer: Self-pay

## 2017-07-25 DIAGNOSIS — I10 Essential (primary) hypertension: Secondary | ICD-10-CM | POA: Diagnosis not present

## 2017-07-25 DIAGNOSIS — Z79899 Other long term (current) drug therapy: Secondary | ICD-10-CM | POA: Insufficient documentation

## 2017-07-25 DIAGNOSIS — J40 Bronchitis, not specified as acute or chronic: Secondary | ICD-10-CM | POA: Insufficient documentation

## 2017-07-25 DIAGNOSIS — R05 Cough: Secondary | ICD-10-CM | POA: Diagnosis present

## 2017-07-25 MED ORDER — ALBUTEROL SULFATE (2.5 MG/3ML) 0.083% IN NEBU
5.0000 mg | INHALATION_SOLUTION | Freq: Once | RESPIRATORY_TRACT | Status: AC
  Administered 2017-07-25: 5 mg via RESPIRATORY_TRACT
  Filled 2017-07-25: qty 6

## 2017-07-25 MED ORDER — PREDNISONE 20 MG PO TABS: 40.0000 mg | ORAL_TABLET | Freq: Every day | ORAL | 0 refills | Status: AC

## 2017-07-25 MED ORDER — ALBUTEROL SULFATE HFA 108 (90 BASE) MCG/ACT IN AERS
1.0000 | INHALATION_SPRAY | Freq: Once | RESPIRATORY_TRACT | Status: AC | PRN
  Administered 2017-07-25: 2 via RESPIRATORY_TRACT
  Filled 2017-07-25: qty 6.7

## 2017-07-25 MED ORDER — BENZONATATE 100 MG PO CAPS: 100.0000 mg | ORAL_CAPSULE | Freq: Three times a day (TID) | ORAL | 0 refills | Status: DC

## 2017-07-25 NOTE — ED Provider Notes (Signed)
[facility] DEPT Provider Note   CSN: [id number] Arrival date & time: 07/25/17  1745     History   Chief Complaint Chief Complaint  Patient presents with  . Cough  . Wheezing  . Headache    HPI Wanda Williams is a 47 y.o. female presenting to the ED with nonproductive cough and wheezing that began 3 days ago.  Patient states she has been having troubles with intermittent wheezing and began feeling more short of breath today.  She states she has been using her husband's albuterol nebulizer treatment at home with some relief.  She states he currently is being treated for bronchitis with similar symptoms.  She states her throat is sore when she coughs, however denies congestion, ear pain, fevers.  She was given DuoNeb and Solu-Medrol in route which improved her wheezing.  She was also given another albuterol nebulizer in the ED which further improved her symptoms.  She states she has been taking some Robitussin at home as well.  No history of asthma.  The history is provided by the patient.    Past Medical History:  Diagnosis Date  . Angio-edema   . Anxiety   . Depression   . Eczema   . HTN (hypertension)     Patient Active Problem List   Diagnosis Date Noted  . Amenorrhea 03/18/2015  . Carpal tunnel syndrome 03/18/2015  . Preventative health care 01/22/2014  . Depression with anxiety 09/04/2013  . Hypertension 11/10/2010    Past Surgical History:  Procedure Laterality Date  . CESAREAN SECTION    . DILATION AND CURETTAGE OF UTERUS    . THYROID CYST EXCISION    . TUBAL LIGATION  2006     OB History   None      Home Medications    Prior to Admission medications   Medication Sig Start Date End Date Taking? Authorizing Provider  acetaminophen-codeine 120-12 MG/5ML solution Take 10 mLs by mouth every 4 (four) hours as needed for moderate pain. 03/01/17   Lawyer, Harrell Gave, PA-C  aspirin-acetaminophen-caffeine (EXCEDRIN MIGRAINE)  [phone] MG tablet Take 1-2 tablets by mouth every 6 (six) hours as needed for headache or migraine.     [provider]  benzonatate (TESSALON) 100 MG capsule Take 1 capsule (100 mg total) by mouth every 8 (eight) hours. 07/25/17   Griffin Gerrard, [place] N, PA-C  betamethasone dipropionate (DIPROLENE) 0.05 % cream Apply topically 2 (two) times daily. Patient taking differently: Apply 1 application topically 2 (two) times daily as needed (for irritated hands).  03/18/15   [name], NP  fluticasone (FLONASE) 50 MCG/ACT nasal spray Place 2 sprays into both nostrils daily. Patient taking differently: Place 2 sprays into both nostrils daily as needed for allergies.  08/17/16   Nafziger, Tommi Rumps, NP  Guaifenesin 1200 MG TB12 Take 1 tablet (1,200 mg total) by mouth 2 (two) times daily. 03/01/17   Lawyer, Harrell Gave, PA-C  meloxicam (MOBIC) 7.5 MG tablet TAKE ONE TABLET BY MOUTH DAILY Patient taking differently: Take 7.5 mg by mouth once a day 12/27/16   [name], NP  Multiple Vitamins-Minerals (MULTIVITAMIN WITH MINERALS) tablet Take 1 tablet by mouth daily.    [provider]  Olopatadine HCl (PAZEO) 0.7 % SOLN Apply 1 drop to eye daily. Patient not taking: Reported on 03/01/2017 02/26/16   Kennith Gain, MD  predniSONE (DELTASONE) 20 MG tablet Take 2 tablets (40 mg total) by mouth daily for 5 days. 07/25/17 07/30/17  Dixon Luczak, [place] N,  PA-C  triamcinolone ointment (KENALOG) 0.5 % APPLY TO AFFECTED AREA(S) TWO TIMES A DAY Patient taking differently: Apply to affected areas two times a day as needed for irritation 11/29/16   Kennith Gain, MD  valsartan-hydrochlorothiazide (DIOVAN-HCT) 160-25 MG tablet Take 1 tablet by mouth daily. 03/29/17   [name], NP  venlafaxine XR (EFFEXOR-XR) 150 MG 24 hr capsule TAKE ONE CAPSULE BY MOUTH EVERY MORNING WITH BREAKFAST 03/29/17   [name], NP    Family History Family History  Problem  Relation Age of Onset  . Heart disease Father        heart murmur  . Parkinson's disease Father   . Hypertension Mother   . Coronary artery disease Paternal Uncle        2 uncles (both died before 29)  . Coronary artery disease Paternal Aunt        MI, died before 45  . Melanoma Brother 42       melanoma  . Allergic rhinitis Sister     Social History Social History   Tobacco Use  . Smoking status: Never Smoker  . Smokeless tobacco: Never Used  Substance Use Topics  . Alcohol use: Yes    Alcohol/week: 0.0 oz    Comment: rare  . Drug use: No     Allergies   Ace inhibitors   Review of Systems Review of Systems  Constitutional: Negative for chills and fever.  HENT: Negative for congestion, ear pain and sore throat.   Respiratory: Positive for cough and wheezing.   All other systems reviewed and are negative.    Physical Exam Updated Vital Signs BP 123/89 (BP Location: Right Arm)   Pulse (!) 112   Temp (!) 97.5 F (36.4 C) (Oral)   Resp 20   Wt 86.2 kg (190 lb)   LMP 04/20/2016   SpO2 95%   BMI 39.71 kg/m   Physical Exam  Constitutional: She appears well-developed and well-nourished. She does not appear ill. No distress.  HENT:  Head: Normocephalic and atraumatic.  Right Ear: Tympanic membrane and ear canal normal.  Left Ear: Tympanic membrane and ear canal normal.  Mouth/Throat: Oropharynx is clear and moist.  Eyes: Conjunctivae are normal.  Neck: Normal range of motion. Neck supple.  Cardiovascular: Regular rhythm and intact distal pulses.  Slightly tachycardic  Pulmonary/Chest: Effort normal. No stridor. No respiratory distress. She has wheezes (Mild diffuse wheezes bilaterally). She has no rales.  Abdominal: Soft.  Lymphadenopathy:    She has no cervical adenopathy.  Neurological: She is alert.  Skin: Skin is warm.  Psychiatric: She has a normal mood and affect. Her behavior is normal.  Nursing note and vitals reviewed.    ED Treatments /  Results  Labs (all labs ordered are listed, but only abnormal results are displayed) Labs Reviewed - No data to display  EKG None  Radiology Dg Chest 2 View  Result Date: 07/25/2017 CLINICAL DATA:  Cough, respiratory distress, wheezing EXAM: CHEST - 2 VIEW COMPARISON:  03/01/2017 FINDINGS: Heart and mediastinal contours are within normal limits. No focal opacities or effusions. No acute bony abnormality. IMPRESSION: No active cardiopulmonary disease. Electronically Signed   By: Rolm Baptise M.D.   On: 07/25/2017 18:59    Procedures Procedures (including critical care time)  Medications Ordered in ED Medications  albuterol (PROVENTIL HFA;VENTOLIN HFA) 108 (90 Base) MCG/ACT inhaler 1-2 puff (has no administration in time range)  albuterol (PROVENTIL) (2.5 MG/3ML) 0.083% nebulizer solution 5 mg (5 mg Nebulization  Given 07/25/17 1825)     Initial Impression / Assessment and Plan / ED Course  I have reviewed the triage vital signs and the nursing notes.  Pertinent labs & imaging results that were available during my care of the patient were reviewed by me and considered in my medical decision making (see chart for details).     Patient with symptoms consistent with viral bronchitis and sick contact at home.  Treated by EMS and in triage with breathing treatments with significant relief.  Afebrile, normal work of breathing, mild wheezing bilaterally. O2 98% on room air during my evaluation.  ENT exam unremarkable.  Chest x-ray negative for pneumonia.  Will discharge with prednisone burst, albuterol and Tessalon for symptoms.  PCP follow-up recommended.  Strict return precautions discussed.  Safe for discharge.  Discussed results, findings, treatment and follow up. Patient advised of return precautions. Patient verbalized understanding and agreed with plan.  Final Clinical Impressions(s) / ED Diagnoses   Final diagnoses:  Bronchitis    ED Discharge Orders        Ordered     benzonatate (TESSALON) 100 MG capsule  Every 8 hours     07/25/17 1936    predniSONE (DELTASONE) 20 MG tablet  Daily     07/25/17 1936       Lura Falor, [place] N, PA-C 07/25/17 Fransico Meadow, MD 07/25/17 2337

## 2017-07-25 NOTE — Discharge Instructions (Signed)
Please read instructions below. Use the albuterol inhaler every 4-6 hours as needed for shortness of breath or wheezing.  Starting tomorrow, begin taking the prednisone, as prescribed, until it is gone. Follow up with your primary care provider. Return to the ER if you have shortness of breath not improved by the inhaler, or new or concerning symptoms.

## 2017-07-25 NOTE — Telephone Encounter (Signed)
Pt. Called to report onset of cough on Sunday.  Stated cough is nonproductive.  "I can't really bring anything up, and, when I cough, my head starts pounding." Reported she is taking Migraine medication, due to the pounding in her head.  Reported onset of shortness of breath  Sunday night.  Stated she began wheezing.  Husband, on phone, and reported he can hear her wheezing in the room.  Pt. c/o "heaviness" in her chest since Monday, with the increased coughing and wheezing.  Stated she gets short of breath with minimal activity, and SOB. remains steady.  Describes her shortness of breath as "mild to moderate."  Voice sounds weak.  Husband agrees her voice quality has changed.  Reported she has been exposed to bronchitis from her husband.  Advised to go to ER, due to increased shortness of breath, wheezing, and heaviness in her chest.  Husband and patient verb. understanding and agree with plan.             Reason for Disposition . [1] MODERATE difficulty breathing (e.g., speaks in phrases, SOB even at rest, pulse 100-120) AND [2] NEW-onset or WORSE than normal  Answer Assessment - Initial Assessment Questions 1. RESPIRATORY STATUS: "Describe your breathing?" (e.g., wheezing, shortness of breath, unable to speak, severe coughing)      Short of breath with minimal activity and intermittent wheeze 2. ONSET: "When did this breathing problem begin?"      Sunday 3. PATTERN "Does the difficult breathing come and go, or has it been constant since it started?"      Steadily present 4. SEVERITY: "How bad is your breathing?" (e.g., mild, moderate, severe)    - MILD: No SOB at rest, mild SOB with walking, speaks normally in sentences, can lay down, no retractions, pulse < 100.    - MODERATE: SOB at rest, SOB with minimal exertion and prefers to sit, cannot lie down flat, speaks in phrases, mild retractions, audible wheezing, pulse 100-120.    - SEVERE: Very SOB at rest, speaks in single words, struggling to  breathe, sitting hunched forward, retractions, pulse > 120     Mildly - moderate 5. RECURRENT SYMPTOM: "Have you had difficulty breathing before?" If so, ask: "When was the last time?" and "What happened that time?"      Unusual 6. CARDIAC HISTORY: "Do you have any history of heart disease?" (e.g., heart attack, angina, bypass surgery, angioplasty)      Denied heart disease personally  7. LUNG HISTORY: "Do you have any history of lung disease?"  (e.g., pulmonary embolus, asthma, emphysema)     Denied asthma, emphysema, or pulm. embolus 8. CAUSE: "What do you think is causing the breathing problem?"      Husband has bronchitis 9. OTHER SYMPTOMS: "Do you have any other symptoms? (e.g., dizziness, runny nose, cough, chest pain, fever)     Cough is freq.; nonproductive, voice is weak, fever 99.1, heaviness in chest from wheezing and coughing since Monday  10. PREGNANCY: "Is there any chance you are pregnant?" "When was your last menstrual period?"      No periods; in menopause 11. TRAVEL: "Have you traveled out of the country in the last month?" (e.g., travel history, exposures)       No  Protocols used: BREATHING DIFFICULTY-A-AH

## 2017-07-25 NOTE — ED Triage Notes (Signed)
Pt reports shortness of breat and wheezing x 3 days. Pt used husband nebulizer x 2 -"makes her feel funny"  Moist cough, small amount this her strong cough. Tx cough with Robitussin

## 2017-07-25 NOTE — ED Triage Notes (Signed)
Per EMS- pt called EMS. Pt c/o cough and respiratory "distress" and wheezing. Pt was given Albuterol 10mg /Atrovent .5mg  . Solumedrol 125mg  IV. IV LAC-20 G. Pt is AO x4 and ambulatory. Hx of anxiety. Wheezing decreased after treatment.

## 2017-08-08 ENCOUNTER — Other Ambulatory Visit: Payer: Self-pay | Admitting: Family

## 2017-08-08 DIAGNOSIS — N6320 Unspecified lump in the left breast, unspecified quadrant: Secondary | ICD-10-CM

## 2017-08-08 DIAGNOSIS — R921 Mammographic calcification found on diagnostic imaging of breast: Secondary | ICD-10-CM

## 2017-08-25 ENCOUNTER — Encounter: Payer: Self-pay | Admitting: Allergy

## 2017-08-25 ENCOUNTER — Ambulatory Visit: Payer: Managed Care, Other (non HMO) | Admitting: Allergy

## 2017-08-25 VITALS — BP 132/92 | HR 77 | Temp 97.8°F | Resp 16 | Ht <= 58 in | Wt 189.4 lb

## 2017-08-25 DIAGNOSIS — J309 Allergic rhinitis, unspecified: Secondary | ICD-10-CM | POA: Diagnosis not present

## 2017-08-25 DIAGNOSIS — H1010 Acute atopic conjunctivitis, unspecified eye: Secondary | ICD-10-CM | POA: Diagnosis not present

## 2017-08-25 DIAGNOSIS — T783XXD Angioneurotic edema, subsequent encounter: Secondary | ICD-10-CM

## 2017-08-25 DIAGNOSIS — L249 Irritant contact dermatitis, unspecified cause: Secondary | ICD-10-CM | POA: Diagnosis not present

## 2017-08-25 MED ORDER — MONTELUKAST SODIUM 10 MG PO TABS: 10.0000 mg | ORAL_TABLET | Freq: Every day | ORAL | 11 refills | Status: DC

## 2017-08-25 NOTE — Patient Instructions (Addendum)
Continue allergy avoidance measures for grasses, weeds, trees, molds, dust mites.    Recommend taking Zyrtec 10mg  daily as needed for nasal congestion or drainage, itchy/watery/red eyes, sneezing. E  Start Singulair 10mg  daily  Use Pazeo 1 drop each eye as needed daily for itchy/watery/red eyes.   Continue flonase 1 sprays each nostril twice a day as needed for nasal congestion  For itchy/dry/red/patchy areas use Eucrisa samples provided today.  Apply twice a day until rash has resolved.   Let us know if Georga Hacking helps with these flares and then we can provide with a prescription.   May apply vanicream for moisturization.   Discussed option of patch testing today for contact dermatitis.  Patches best placed on Mondays with return to office on Wed and Friday of same week for readings.  Let us know if you would like to have this testing done.   Should significant symptoms of swelling recur or new symptoms occur, a journal is to be kept recording any foods eaten, beverages consumed, medications taken, activities performed, and environmental conditions within a 6 hour time period prior to the onset of symptoms.    Follow-up 1 year or sooner if needed

## 2017-08-25 NOTE — Addendum Note (Signed)
Addended by: Theresia Lo on: 08/25/2017 03:19 PM   Modules accepted: Orders

## 2017-08-25 NOTE — Progress Notes (Signed)
Follow-up Note  RE: Wanda Williams MRN: [id number] DOB: Apr 03, 1970 Date of Office Visit: 08/25/2017   History of present illness: Wanda Williams is a 47 y.o. female presenting today for follow-up of allergic rhinoconjunctivitis and angioedema.  She was last seen in the office on 08/26/16 by myself.  She denies any major health changes, surgeries or hospitalizations since last visit.     She states she has had a decent year.  She does states that she had bronchitis in May and needed to have breathing treatments.  She is recovered from this illnesses.     She states in the past year she did have development of a red, itchy, dry and scaly rash over her upper eyelid that lasted about 4 days.  She states she was using vanicream on the eye which was helping with moisturization until she ran out.  She is not sure what triggered this rash.  She has had similar rash before.     With her allergies she states she is doing ok with use of zyrtec and flonase and she has pazeo which she uses rarely.  She is asking today about singulair and if it can be beneficial for her.     Review of systems: Review of Systems  Constitutional: Negative for chills, fever and malaise/fatigue.  HENT: Negative for congestion, ear discharge, ear pain, nosebleeds, sinus pain and sore throat.   Eyes: Negative for pain, discharge and redness.  Respiratory: Negative for cough, shortness of breath and wheezing.   Cardiovascular: Negative for chest pain.  Gastrointestinal: Negative for abdominal pain, constipation, diarrhea, heartburn, nausea and vomiting.  Musculoskeletal: Negative for joint pain.  Skin: Negative for itching and rash.  Neurological: Negative for headaches.    All other systems negative unless noted above in HPI  Past medical/social/surgical/family history have been reviewed and are unchanged unless specifically indicated below.  No changes  Medication List: Allergies as of 08/25/2017      Reactions   Ace Inhibitors Cough      Medication List        Accurate as of 08/25/17  3:15 PM. Always use your most recent med list.          aspirin-acetaminophen-caffeine 250-250-65 MG tablet Commonly known as:  EXCEDRIN MIGRAINE Take 1-2 tablets by mouth every 6 (six) hours as needed for headache or migraine.   betamethasone dipropionate 0.05 % cream Commonly known as:  DIPROLENE Apply topically 2 (two) times daily.   fluticasone 50 MCG/ACT nasal spray Commonly known as:  FLONASE Place 2 sprays into both nostrils daily.   Guaifenesin 1200 MG Tb12 Take 1 tablet (1,200 mg total) by mouth 2 (two) times daily.   meloxicam 7.5 MG tablet Commonly known as:  MOBIC TAKE ONE TABLET BY MOUTH DAILY   multivitamin with minerals tablet Take 1 tablet by mouth daily.   triamcinolone ointment 0.5 % Commonly known as:  KENALOG APPLY TO AFFECTED AREA(S) TWO TIMES A DAY   valsartan-hydrochlorothiazide 160-25 MG tablet Commonly known as:  DIOVAN-HCT Take 1 tablet by mouth daily.   venlafaxine XR 150 MG 24 hr capsule Commonly known as:  EFFEXOR-XR TAKE ONE CAPSULE BY MOUTH EVERY MORNING WITH BREAKFAST       Known medication allergies: Allergies  Allergen Reactions  . Ace Inhibitors Cough     Physical examination: Blood pressure (!) 132/92, pulse 77, temperature 97.8 F (36.6 C), temperature source Oral, resp. rate 16, height 4' 9.8" (1.468 m), weight 189 lb 6.4  oz (85.9 kg), last menstrual period 04/20/2016, SpO2 95 %.  General: Alert, interactive, in no acute distress. HEENT: PERRLA, TMs pearly gray, turbinates minimally edematous without discharge, post-pharynx non erythematous. Neck: Supple without lymphadenopathy. Lungs: Clear to auscultation without wheezing, rhonchi or rales. {no increased work of breathing. CV: Normal S1, S2 without murmurs. Abdomen: Nondistended, nontender. Skin: Warm and dry, without lesions or rashes. Extremities:  No clubbing, cyanosis or edema. Neuro:    Grossly intact.  Diagnositics/Labs: None today  Assessment and plan:   Angioedema   - she has not had any further episodes since her initial visit.     - Should significant symptoms of swelling recur or new symptoms occur, a journal is to be kept recording any foods eaten, beverages consumed, medications taken, activities performed, and environmental conditions within a 6 hour time period prior to the onset of symptoms.    - she will be taking zyrtec and singulair which will help to reduce chance of angioedema  Allergic rhinoconjunctivitis  - Continue allergy avoidance measures for grasses, weeds, trees, molds, dust mites.    - Recommend taking Zyrtec 10mg  daily as needed for nasal congestion or drainage, itchy/watery/red eyes, sneezing.  - Start Singulair 10mg  daily  - Use Pazeo 1 drop each eye as needed daily for itchy/watery/red eyes.   - Continue flonase 1 sprays each nostril twice a day as needed for nasal congestion  Contact dermatitis  - For itchy/dry/red/patchy areas use Eucrisa samples provided today.  Apply twice a day until rash has resolved.   Let us know if Georga Hacking helps with these flares and then we can provide with a prescription.   - May apply vanicream for moisturization.   -Discussed option of patch testing today for contact dermatitis.  Patches best placed on Mondays with return to office on Wed and Friday of same week for readings.  Let us know if you would like to have this testing done.   Follow-up 1 year or sooner if needed  I appreciate the opportunity to take part in Lexis's care. Please do not hesitate to contact me with questions.  Sincerely,   Prudy Feeler, MD Allergy/Immunology [facility]

## 2017-08-29 ENCOUNTER — Ambulatory Visit
Admission: RE | Admit: 2017-08-29 | Discharge: 2017-08-29 | Disposition: A | Discharge status: Home / Self Care | Payer: Managed Care, Other (non HMO) | Source: Ambulatory Visit | Attending: Family | Admitting: Family

## 2017-08-29 DIAGNOSIS — R921 Mammographic calcification found on diagnostic imaging of breast: Secondary | ICD-10-CM

## 2017-09-04 ENCOUNTER — Ambulatory Visit: Payer: Managed Care, Other (non HMO) | Admitting: Family

## 2017-09-22 ENCOUNTER — Encounter: Payer: Self-pay | Admitting: Family

## 2017-10-03 ENCOUNTER — Ambulatory Visit: Payer: Managed Care, Other (non HMO) | Admitting: Family

## 2017-10-03 ENCOUNTER — Ambulatory Visit: Payer: Self-pay | Admitting: Family

## 2017-10-03 ENCOUNTER — Telehealth: Payer: Self-pay | Admitting: *Deleted

## 2017-10-03 VITALS — BP 137/72 | HR 83 | Temp 97.8°F | Resp 16 | Ht <= 58 in | Wt 192.0 lb

## 2017-10-03 DIAGNOSIS — F329 Major depressive disorder, single episode, unspecified: Secondary | ICD-10-CM | POA: Diagnosis not present

## 2017-10-03 DIAGNOSIS — F32A Depression, unspecified: Secondary | ICD-10-CM

## 2017-10-03 DIAGNOSIS — M722 Plantar fascial fibromatosis: Secondary | ICD-10-CM | POA: Diagnosis not present

## 2017-10-03 DIAGNOSIS — I10 Essential (primary) hypertension: Secondary | ICD-10-CM | POA: Diagnosis not present

## 2017-10-03 LAB — BASIC METABOLIC PANEL
BUN: 24 mg/dL — AB (ref 6–23)
CO2: 32 mEq/L (ref 19–32)
CREATININE: 0.55 mg/dL (ref 0.40–1.20)
Calcium: 9.1 mg/dL (ref 8.4–10.5)
Chloride: 104 mEq/L (ref 96–112)
GFR: 124.59 mL/min (ref >60.00)
GLUCOSE: 101 mg/dL — AB (ref 70–99)
POTASSIUM: 4.1 meq/L (ref 3.5–5.1)
Sodium: 143 mEq/L (ref 135–145)

## 2017-10-03 MED ORDER — MELOXICAM 7.5 MG PO TABS: ORAL_TABLET | ORAL | 0 refills | Status: DC

## 2017-10-03 MED ORDER — VENLAFAXINE HCL ER 37.5 MG PO CP24: ORAL_CAPSULE | ORAL | 0 refills | Status: DC

## 2017-10-03 NOTE — Telephone Encounter (Signed)
Notified pharmacist, Alice per verbal from PCP that venlafaxine Rx should be 4 caps once daily for 1 week, 3 caps once daily for 1 week, 2 caps once daily and continue at that dose; same quantity and no refills.

## 2017-10-03 NOTE — Progress Notes (Signed)
Subjective:    Patient ID: Wanda Williams, female    DOB: Oct 15, 1970, 47 y.o.   MRN: [id number]  HPI   Patient is a 47 year old female who presents today with chief complaint of left-sided heel pain.  Heel pain is been present for 3 to 4 months.reports that she tried meloxicam previously which help.  Reports that there are days when she feels like she can't walk on her heel.  She wears slip resistant gel cushion insert shoes.    Hypertension-she is maintained on Diovan HCT. BP Readings from Last 3 Encounters:  10/03/17 137/72  08/25/17 (!) 132/92  07/25/17 123/89   Depression-she continues Effexor X are 150 mg daily. Reports that her mood is good but does not have any sex drive and has anorgasmia. Reports that this is becoming a stressor in her marriage.     Review of Systems    see HPI  Past Medical History:  Diagnosis Date  . Angio-edema   . Anxiety   . Depression   . Eczema   . HTN (hypertension)      Social History   Socioeconomic History  . Marital status: Married    Spouse name: Not on file  . Number of children: Not on file  . Years of education: Not on file  . Highest education level: Not on file  Occupational History  . Not on file  Social Needs  . Financial resource strain: Not on file  . Food insecurity:    Worry: Not on file    Inability: Not on file  . Transportation needs:    Medical: Not on file    Non-medical: Not on file  Tobacco Use  . Smoking status: Never Smoker  . Smokeless tobacco: Never Used  Substance and Sexual Activity  . Alcohol use: Yes    Alcohol/week: 0.0 oz    Comment: rare  . Drug use: No  . Sexual activity: Yes  Lifestyle  . Physical activity:    Days per week: Not on file    Minutes per session: Not on file  . Stress: Not on file  Relationships  . Social connections:    Talks on phone: Not on file    Gets together: Not on file    Attends religious service: Not on file    Active member of club or organization: Not  on file    Attends meetings of clubs or organizations: Not on file    Relationship status: Not on file  . Intimate partner violence:    Fear of current or ex partner: Not on file    Emotionally abused: Not on file    Physically abused: Not on file    Forced sexual activity: Not on file  Other Topics Concern  . Not on file  Social History Narrative   Works at [employer]   One daughter age 84   One daughter is age 73   Enjoys cross stitching, playing games on her cell phone.     2 dogs   Completed 12th grade.     Past Surgical History:  Procedure Laterality Date  . CESAREAN SECTION    . DILATION AND CURETTAGE OF UTERUS    . THYROID CYST EXCISION    . TUBAL LIGATION  2006    Family History  Problem Relation Age of Onset  . Heart disease Father        heart murmur  . Parkinson's disease Father   . Hypertension Mother   .  Coronary artery disease Paternal Uncle        2 uncles (both died before 31)  . Coronary artery disease Paternal Aunt        MI, died before 3  . Melanoma Brother 31       melanoma  . Allergic rhinitis Sister     Allergies  Allergen Reactions  . Ace Inhibitors Cough    Current Outpatient Medications on File Prior to Visit  Medication Sig Dispense Refill  . aspirin-acetaminophen-caffeine (EXCEDRIN MIGRAINE) 250-250-65 MG tablet Take 1-2 tablets by mouth every 6 (six) hours as needed for headache or migraine.     . betamethasone dipropionate (DIPROLENE) 0.05 % cream Apply topically 2 (two) times daily. (Patient taking differently: Apply 1 application topically 2 (two) times daily as needed (for irritated hands). ) 30 g 0  . fluticasone (FLONASE) 50 MCG/ACT nasal spray Place 2 sprays into both nostrils daily. (Patient taking differently: Place 2 sprays into both nostrils daily as needed for allergies. ) 16 g 6  . Guaifenesin 1200 MG TB12 Take 1 tablet (1,200 mg total) by mouth 2 (two) times daily. 20 each 0  . meloxicam (MOBIC) 7.5 MG tablet TAKE ONE  TABLET BY MOUTH DAILY (Patient taking differently: Take 7.5 mg by mouth once a day) 30 tablet 0  . montelukast (SINGULAIR) 10 MG tablet Take 1 tablet (10 mg total) by mouth at bedtime. 30 tablet 11  . Multiple Vitamins-Minerals (MULTIVITAMIN WITH MINERALS) tablet Take 1 tablet by mouth daily.    Marland Kitchen triamcinolone ointment (KENALOG) 0.5 % APPLY TO AFFECTED AREA(S) TWO TIMES A DAY (Patient taking differently: Apply to affected areas two times a day as needed for irritation) 45 g 3  . valsartan-hydrochlorothiazide (DIOVAN-HCT) 160-25 MG tablet Take 1 tablet by mouth daily. 30 tablet 2  . venlafaxine XR (EFFEXOR-XR) 150 MG 24 hr capsule TAKE ONE CAPSULE BY MOUTH EVERY MORNING WITH BREAKFAST 30 capsule 2   No current facility-administered medications on file prior to visit.     BP 137/72 (BP Location: Left Arm, Patient Position: Sitting, Cuff Size: Large)   Pulse 83   Temp 97.8 F (36.6 C) (Oral)   Resp 16   Ht 4\' 10"  (1.473 m)   Wt 192 lb (87.1 kg)   LMP 04/20/2016   SpO2 97%   BMI 40.13 kg/m    Objective:   Physical Exam  Constitutional: She is oriented to person, place, and time. She appears well-developed and well-nourished.  Cardiovascular: Normal rate, regular rhythm and normal heart sounds.  No murmur heard. Pulmonary/Chest: Effort normal and breath sounds normal. No respiratory distress. She has no wheezes.  Musculoskeletal:  Left heel tender to palpation  Neurological: She is alert and oriented to person, place, and time.  Skin: Skin is warm.  Psychiatric: She has a normal mood and affect. Her behavior is normal. Judgment and thought content normal.          Assessment & Plan:  Plantar fasciitis- Pt is advised as follows:  Please begin exercises for your plantar fascitis. Ice your foot twice daily by rolling on a frozen water bottle. Begin meloxicam once daily. Call if new/worsening symptoms or if not improved in 2-3 weeks.   Depression- stable but having a lot of  sexual side effects. Will plan to decrease effexor slowly- see orders. She will follow up in 1 month and is advised to let me know if she develops any issues with depression during taper.  HTN- bp stable.  Check  bmet today.

## 2017-10-03 NOTE — Patient Instructions (Addendum)
Please begin exercises for your plantar fascitis. Ice your foot twice daily by rolling on a frozen water bottle. Begin meloxicam once daily. Call if new/worsening symptoms or if not improved in 2-3 weeks.  Change effexor xr to 37.5mg  tablets and taper as follows: take 4 tabs by mouth once daily for 1 week, then 3 tabs by mouth once daily for 1 week then 2 tabs by mouth once daily until you see me back.  Let me know if you develop any issues with depression with the effexor taper.

## 2017-10-04 ENCOUNTER — Encounter: Payer: Self-pay | Admitting: Family

## 2017-10-24 ENCOUNTER — Other Ambulatory Visit: Payer: Self-pay | Admitting: Family

## 2017-11-03 ENCOUNTER — Ambulatory Visit: Payer: Managed Care, Other (non HMO) | Admitting: Family

## 2017-11-03 ENCOUNTER — Encounter: Payer: Self-pay | Admitting: Family

## 2017-11-03 VITALS — BP 121/84 | HR 87 | Temp 97.5°F | Resp 18 | Ht <= 58 in | Wt 194.0 lb

## 2017-11-03 DIAGNOSIS — F329 Major depressive disorder, single episode, unspecified: Secondary | ICD-10-CM | POA: Diagnosis not present

## 2017-11-03 DIAGNOSIS — N644 Mastodynia: Secondary | ICD-10-CM | POA: Diagnosis not present

## 2017-11-03 DIAGNOSIS — F32A Depression, unspecified: Secondary | ICD-10-CM

## 2017-11-03 MED ORDER — VENLAFAXINE HCL ER 37.5 MG PO CP24: 37.5000 mg | ORAL_CAPSULE | Freq: Every day | ORAL | 1 refills | Status: DC

## 2017-11-03 NOTE — Patient Instructions (Signed)
Continue effexor 37.5mg  once daily and let me know in 1 month how you are doing.

## 2017-11-03 NOTE — Progress Notes (Signed)
Subjective:    Patient ID: Wanda Williams, female    DOB: 1970-08-28, 47 y.o.   MRN: [id number]  HPI  Depression- last visit we decreased her effexor dose due to sexual side effects. She is currently taking effexor xr 37.5mg  two tabs once daily.    Review of Systems     Past Medical History:  Diagnosis Date  . Angio-edema   . Anxiety   . Depression   . Eczema   . HTN (hypertension)      Social History   Socioeconomic History  . Marital status: Married    Spouse name: Not on file  . Number of children: Not on file  . Years of education: Not on file  . Highest education level: Not on file  Occupational History  . Not on file  Social Needs  . Financial resource strain: Not on file  . Food insecurity:    Worry: Not on file    Inability: Not on file  . Transportation needs:    Medical: Not on file    Non-medical: Not on file  Tobacco Use  . Smoking status: Never Smoker  . Smokeless tobacco: Never Used  Substance and Sexual Activity  . Alcohol use: Yes    Alcohol/week: 0.0 standard drinks    Comment: rare  . Drug use: No  . Sexual activity: Yes  Lifestyle  . Physical activity:    Days per week: Not on file    Minutes per session: Not on file  . Stress: Not on file  Relationships  . Social connections:    Talks on phone: Not on file    Gets together: Not on file    Attends religious service: Not on file    Active member of club or organization: Not on file    Attends meetings of clubs or organizations: Not on file    Relationship status: Not on file  . Intimate partner violence:    Fear of current or ex partner: Not on file    Emotionally abused: Not on file    Physically abused: Not on file    Forced sexual activity: Not on file  Other Topics Concern  . Not on file  Social History Narrative   Works at [employer]   One daughter age 7   One daughter is age 84   Enjoys cross stitching, playing games on her cell phone.     2 dogs   Completed 12th  grade.     Past Surgical History:  Procedure Laterality Date  . CESAREAN SECTION    . DILATION AND CURETTAGE OF UTERUS    . THYROID CYST EXCISION    . TUBAL LIGATION  2006    Family History  Problem Relation Age of Onset  . Heart disease Father        heart murmur  . Parkinson's disease Father   . Hypertension Mother   . Coronary artery disease Paternal Uncle        2 uncles (both died before 12)  . Coronary artery disease Paternal Aunt        MI, died before 80  . Melanoma Brother 82       melanoma  . Allergic rhinitis Sister     Allergies  Allergen Reactions  . Ace Inhibitors Cough    Current Outpatient Medications on File Prior to Visit  Medication Sig Dispense Refill  . aspirin-acetaminophen-caffeine (EXCEDRIN MIGRAINE) 250-250-65 MG tablet Take 1-2 tablets by mouth every 6 (  six) hours as needed for headache or migraine.     . betamethasone dipropionate (DIPROLENE) 0.05 % cream Apply topically 2 (two) times daily. (Patient taking differently: Apply 1 application topically 2 (two) times daily as needed (for irritated hands). ) 30 g 0  . fluticasone (FLONASE) 50 MCG/ACT nasal spray Place 2 sprays into both nostrils daily. (Patient taking differently: Place 2 sprays into both nostrils daily as needed for allergies. ) 16 g 6  . meloxicam (MOBIC) 7.5 MG tablet Take 7.5 mg by mouth once a day 30 tablet 0  . montelukast (SINGULAIR) 10 MG tablet Take 1 tablet (10 mg total) by mouth at bedtime. 30 tablet 11  . Multiple Vitamins-Minerals (MULTIVITAMIN WITH MINERALS) tablet Take 1 tablet by mouth daily.    Marland Kitchen triamcinolone ointment (KENALOG) 0.5 % APPLY TO AFFECTED AREA(S) TWO TIMES A DAY (Patient taking differently: Apply to affected areas two times a day as needed for irritation) 45 g 3  . valsartan-hydrochlorothiazide (DIOVAN-HCT) 160-25 MG tablet TAKE 1 TABLET BY MOUTH ONCE DAILY 90 tablet 1   No current facility-administered medications on file prior to visit.     BP 121/84  (BP Location: Left Arm, Cuff Size: Large)   Pulse 87   Temp (!) 97.5 F (36.4 C) (Oral)   Resp 18   Ht 4\' 10"  (1.473 m)   Wt 194 lb (88 kg)   LMP 04/20/2016   SpO2 98%   BMI 40.55 kg/m    Objective:   Physical Exam  Constitutional: She appears well-developed and well-nourished.  Cardiovascular: Normal rate, regular rhythm and normal heart sounds.  No murmur heard. Pulmonary/Chest: Effort normal and breath sounds normal. No respiratory distress. She has no wheezes.  Psychiatric: She has a normal mood and affect. Her behavior is normal. Judgment and thought content normal.  breast: normal breast exam bilaterally, no masses tenderness        Assessment & Plan:  Depression- scored 5 on PHQ-9.  Dropped down to 1 tab of effexor 37.5mg  once daily a few days ago.  Will continue at this dose and have her let me know how she is feeling in 1 month. If she is feeling well, may consider discontinuation of effexor.  mastalgia- had neg mammogram 08/29/17. Normal exam. It sounds like her pain may be some intermittent radicular pain from the thoracic spine. Advised aleve bid for a few days-pt is advised to call if symptoms worsen or if symptoms fail to improve.

## 2017-11-20 ENCOUNTER — Encounter (HOSPITAL_COMMUNITY): Payer: Self-pay | Admitting: Emergency Medicine

## 2017-11-20 ENCOUNTER — Emergency Department (HOSPITAL_COMMUNITY)
Admission: EM | Admit: 2017-11-20 | Discharge: 2017-11-20 | Disposition: A | Discharge status: Home / Self Care | Payer: Managed Care, Other (non HMO) | Attending: Emergency Medicine | Admitting: Emergency Medicine

## 2017-11-20 DIAGNOSIS — I10 Essential (primary) hypertension: Secondary | ICD-10-CM | POA: Insufficient documentation

## 2017-11-20 DIAGNOSIS — I951 Orthostatic hypotension: Secondary | ICD-10-CM | POA: Diagnosis not present

## 2017-11-20 DIAGNOSIS — Z79899 Other long term (current) drug therapy: Secondary | ICD-10-CM | POA: Insufficient documentation

## 2017-11-20 DIAGNOSIS — E876 Hypokalemia: Secondary | ICD-10-CM | POA: Diagnosis not present

## 2017-11-20 DIAGNOSIS — I959 Hypotension, unspecified: Secondary | ICD-10-CM

## 2017-11-20 DIAGNOSIS — R9431 Abnormal electrocardiogram [ECG] [EKG]: Secondary | ICD-10-CM | POA: Insufficient documentation

## 2017-11-20 DIAGNOSIS — N390 Urinary tract infection, site not specified: Secondary | ICD-10-CM | POA: Insufficient documentation

## 2017-11-20 DIAGNOSIS — R55 Syncope and collapse: Secondary | ICD-10-CM

## 2017-11-20 LAB — CBC
HEMATOCRIT: 38.8 % (ref 36.0–46.0)
Hemoglobin: 12.8 g/dL (ref 12.0–15.0)
MCH: 30.5 pg (ref 26.0–34.0)
MCHC: 33 g/dL (ref 30.0–36.0)
MCV: 92.6 fL (ref 78.0–100.0)
Platelets: 407 10*3/uL — ABNORMAL HIGH (ref 150–400)
RBC: 4.19 MIL/uL (ref 3.87–5.11)
RDW: 13.1 % (ref 11.5–15.5)
WBC: 12.3 10*3/uL — ABNORMAL HIGH (ref 4.0–10.5)

## 2017-11-20 LAB — URINALYSIS, ROUTINE W REFLEX MICROSCOPIC
BILIRUBIN URINE: NEGATIVE
GLUCOSE, UA: NEGATIVE mg/dL
HGB URINE DIPSTICK: NEGATIVE
Ketones, ur: NEGATIVE mg/dL
Nitrite: POSITIVE — AB
PH: 5 (ref 5.0–8.0)
Protein, ur: NEGATIVE mg/dL
SPECIFIC GRAVITY, URINE: 1.018 (ref 1.005–1.030)

## 2017-11-20 LAB — BASIC METABOLIC PANEL
ANION GAP: 13 (ref 5–15)
BUN: 15 mg/dL (ref 6–20)
CO2: 28 mmol/L (ref 22–32)
Calcium: 9.5 mg/dL (ref 8.9–10.3)
Chloride: 102 mmol/L (ref 98–111)
Creatinine, Ser: 0.76 mg/dL (ref 0.44–1.00)
GFR calc Af Amer: >60 mL/min (ref >60)
GLUCOSE: 93 mg/dL (ref 70–99)
Potassium: 3 mmol/L — ABNORMAL LOW (ref 3.5–5.1)
Sodium: 143 mmol/L (ref 135–145)

## 2017-11-20 LAB — I-STAT BETA HCG BLOOD, ED (MC, WL, AP ONLY): I-stat hCG, quantitative: <5 m[IU]/mL (ref <5)

## 2017-11-20 LAB — TROPONIN I: Troponin I: <0.03 ng/mL (ref <0.03)

## 2017-11-20 LAB — D-DIMER, QUANTITATIVE (NOT AT ARMC)

## 2017-11-20 LAB — CBG MONITORING, ED: GLUCOSE-CAPILLARY: 179 mg/dL — AB (ref 70–99)

## 2017-11-20 MED ORDER — POTASSIUM CHLORIDE CRYS ER 20 MEQ PO TBCR
40.0000 meq | EXTENDED_RELEASE_TABLET | Freq: Once | ORAL | Status: AC
  Administered 2017-11-20: 40 meq via ORAL
  Filled 2017-11-20: qty 2

## 2017-11-20 MED ORDER — POTASSIUM CHLORIDE ER 10 MEQ PO TBCR: 20.0000 meq | EXTENDED_RELEASE_TABLET | Freq: Two times a day (BID) | ORAL | 0 refills | Status: DC

## 2017-11-20 MED ORDER — CEPHALEXIN 500 MG PO CAPS: 1000.0000 mg | ORAL_CAPSULE | Freq: Two times a day (BID) | ORAL | 0 refills | Status: AC

## 2017-11-20 MED ORDER — SODIUM CHLORIDE 0.9 % IV SOLN
1.0000 g | Freq: Once | INTRAVENOUS | Status: AC
  Administered 2017-11-20: 1 g via INTRAVENOUS
  Filled 2017-11-20: qty 10

## 2017-11-20 MED ORDER — SODIUM CHLORIDE 0.9 % IV BOLUS
1000.0000 mL | Freq: Once | INTRAVENOUS | Status: AC
  Administered 2017-11-20: 1000 mL via INTRAVENOUS

## 2017-11-20 NOTE — ED Notes (Signed)
CBG 179 

## 2017-11-20 NOTE — ED Provider Notes (Signed)
6:44 AM BP 121/79   Pulse 85   Temp 97.7 F (36.5 C) (Oral)   Resp 15   Ht 4\' 9"  (1.448 m)   Wt 88 kg   LMP 04/20/2016   SpO2 99%   BMI 41.98 kg/m  Patient taken in sign out from [facility]. Presents with near syncope and orthostatic hypotension. HX hypertension and no other RF for CAD. EKG showed inferior and lateral ST segment depression.  Last EKG normal and but there was a previous with the same. + for UTI treated here. SX improved with fluid rehydration. + hypokalemia treated with oral repletion.   Awaiting Cardiology consult given EKG changes.  Disposition pending consult.   Patient seen by cardiology who reviewed EKGs.  They will have her seen in the clinic this week. I answered all questions to the best of my ability .    Margarita Mail, PA-C 11/20/17 1542    Davonna Belling, MD 11/20/17 1553

## 2017-11-20 NOTE — Discharge Instructions (Addendum)
You will be contacted by [facility] for an appointment to evaluate your abnormal ECG in the office. Contact a health care provider if: You have back pain. You have a fever. You feel nauseous or vomit. Your symptoms do not get better after 3 days. Your symptoms go away and then return. Get help right away if: You have severe back pain or lower abdominal pain. You are vomiting and cannot keep down any medicines or water. Get help right away if: You have a severe headache. You have unusual pain in your chest, abdomen, or back. You are bleeding from your mouth or rectum, or you have black or tarry stool. You have a very fast or irregular heartbeat (palpitations). You faint once or repeatedly. You have a seizure. You are confused. You have trouble walking. You have severe weakness. You have vision problems.

## 2017-11-20 NOTE — ED Provider Notes (Addendum)
[place] EMERGENCY DEPARTMENT Provider Note   CSN: [id number] Arrival date & time: 11/20/17  0044     History   Chief Complaint Chief Complaint  Patient presents with  . Near Syncope    HPI Wanda Williams is a 47 y.o. female.  Patient here for evaluation of episode this evening of severe lightheadedness, near syncope that occurred after she stood up to walk. She laid down on the floor and denies full syncope at any time. The episode was described as having included diaphoresis, nausea w/o vomiting, SOB and left arm aching. No chest pain. She felt her face become very cold, felt more on the left side. EMS was called and per report, the patient was found to be hypotensive and orthostatic on their arrival. The patient is a non-smoker with history of HTN, depression. She has had a recent change in her Effexor dosing, decreasing from 150 mg daily to 37.5 daily about 3 weeks ago. No other medication changes.   The history is provided by the patient and the spouse. No language interpreter was used.  Near Syncope  Associated symptoms include abdominal pain and shortness of breath. Pertinent negatives include no chest pain.    Past Medical History:  Diagnosis Date  . Angio-edema   . Anxiety   . Depression   . Eczema   . HTN (hypertension)     Patient Active Problem List   Diagnosis Date Noted  . Amenorrhea 03/18/2015  . Carpal tunnel syndrome 03/18/2015  . Preventative health care 01/22/2014  . Depression with anxiety 09/04/2013  . Hypertension 11/10/2010    Past Surgical History:  Procedure Laterality Date  . CESAREAN SECTION    . DILATION AND CURETTAGE OF UTERUS    . THYROID CYST EXCISION    . TUBAL LIGATION  2006     OB History   None      Home Medications    Prior to Admission medications   Medication Sig Start Date End Date Taking? Authorizing Provider  aspirin-acetaminophen-caffeine (EXCEDRIN MIGRAINE) [phone] MG tablet Take 1-2  tablets by mouth every 6 (six) hours as needed for headache or migraine.     [provider]  betamethasone dipropionate (DIPROLENE) 0.05 % cream Apply topically 2 (two) times daily. Patient taking differently: Apply 1 application topically 2 (two) times daily as needed (for irritated hands).  03/18/15   [name], NP  fluticasone (FLONASE) 50 MCG/ACT nasal spray Place 2 sprays into both nostrils daily. Patient taking differently: Place 2 sprays into both nostrils daily as needed for allergies.  08/17/16   Nafziger, Tommi Rumps, NP  meloxicam (MOBIC) 7.5 MG tablet Take 7.5 mg by mouth once a day 10/03/17   [name], NP  montelukast (SINGULAIR) 10 MG tablet Take 1 tablet (10 mg total) by mouth at bedtime. 08/25/17   Kennith Gain, MD  Multiple Vitamins-Minerals (MULTIVITAMIN WITH MINERALS) tablet Take 1 tablet by mouth daily.    [provider]  triamcinolone ointment (KENALOG) 0.5 % APPLY TO AFFECTED AREA(S) TWO TIMES A DAY Patient taking differently: Apply to affected areas two times a day as needed for irritation 11/29/16   Kennith Gain, MD  valsartan-hydrochlorothiazide (DIOVAN-HCT) 160-25 MG tablet TAKE 1 TABLET BY MOUTH ONCE DAILY 10/24/17   [name], NP  venlafaxine XR (EFFEXOR XR) 37.5 MG 24 hr capsule Take 1 capsule (37.5 mg total) by mouth daily with breakfast. 11/03/17   [name], NP    Family History Family History  Problem Relation Age of Onset  . Heart disease Father        heart murmur  . Parkinson's disease Father   . Hypertension Mother   . Coronary artery disease Paternal Uncle        2 uncles (both died before 86)  . Coronary artery disease Paternal Aunt        MI, died before 27  . Melanoma Brother 46       melanoma  . Allergic rhinitis Sister     Social History Social History   Tobacco Use  . Smoking status: Never Smoker  . Smokeless tobacco: Never Used  Substance Use Topics  . Alcohol  use: Yes    Alcohol/week: 0.0 standard drinks    Comment: rare  . Drug use: No     Allergies   Ace inhibitors   Review of Systems Review of Systems  Constitutional: Positive for diaphoresis. Negative for chills and fever.  HENT: Negative.   Respiratory: Positive for shortness of breath.   Cardiovascular: Positive for near-syncope. Negative for chest pain.  Gastrointestinal: Positive for abdominal pain and nausea. Negative for vomiting.  Genitourinary: Negative.   Musculoskeletal: Negative.   Skin: Negative.  Negative for pallor.  Neurological: Positive for weakness and light-headedness.     Physical Exam Updated Vital Signs BP 125/73   Pulse 86   Temp 97.7 F (36.5 C) (Oral)   Resp 19   Ht 4\' 9"  (1.448 m)   Wt 88 kg   LMP 04/20/2016   SpO2 94%   BMI 41.98 kg/m   Physical Exam  Constitutional: She is oriented to person, place, and time. She appears well-developed and well-nourished. No distress.  HENT:  Head: Normocephalic and atraumatic.  Neck: Normal range of motion. Neck supple. Carotid bruit is not present.  Cardiovascular: Normal rate and regular rhythm.  No murmur heard. Pulmonary/Chest: Effort normal and breath sounds normal. She has no wheezes. She has no rales.  Abdominal: Soft. Bowel sounds are normal. There is no tenderness. There is no rebound and no guarding.  Musculoskeletal: Normal range of motion. She exhibits no edema or tenderness.  Neurological: She is alert and oriented to person, place, and time. No sensory deficit. She exhibits normal muscle tone. Coordination normal.  Skin: Skin is warm and dry. No rash noted.  Psychiatric: She has a normal mood and affect.  Nursing note and vitals reviewed.    ED Treatments / Results  Labs (all labs ordered are listed, but only abnormal results are displayed) Labs Reviewed  CBG MONITORING, ED - Abnormal; Notable for the following components:      Result Value   Glucose-Capillary 179 (*)    All  other components within normal limits  BASIC METABOLIC PANEL  CBC  URINALYSIS, ROUTINE W REFLEX MICROSCOPIC  TROPONIN I  D-DIMER, QUANTITATIVE (NOT AT [facility])  I-STAT BETA HCG BLOOD, ED ([facility], [facility], [facility] ONLY)   Results for orders placed or performed during the hospital encounter of 93/81/82  Basic metabolic panel  Result Value Ref Range   Sodium 143 135 - 145 mmol/L   Potassium 3.0 (L) 3.5 - 5.1 mmol/L   Chloride 102 98 - 111 mmol/L   CO2 28 22 - 32 mmol/L   Glucose, Bld 93 70 - 99 mg/dL   BUN 15 6 - 20 mg/dL   Creatinine, Ser 0.76 0.44 - 1.00 mg/dL   Calcium 9.5 8.9 - 10.3 mg/dL   GFR calc non Af Amer >60 >60 mL/min  GFR calc Af Amer >60 >60 mL/min   Anion gap 13 5 - 15  CBC  Result Value Ref Range   WBC 12.3 (H) 4.0 - 10.5 K/uL   RBC 4.19 3.87 - 5.11 MIL/uL   Hemoglobin 12.8 12.0 - 15.0 g/dL   HCT 38.8 36.0 - 46.0 %   MCV 92.6 78.0 - 100.0 fL   MCH 30.5 26.0 - 34.0 pg   MCHC 33.0 30.0 - 36.0 g/dL   RDW 13.1 11.5 - 15.5 %   Platelets 407 (H) 150 - 400 K/uL  Urinalysis, Routine w reflex microscopic  Result Value Ref Range   Color, Urine YELLOW YELLOW   APPearance CLOUDY (A) CLEAR   Specific Gravity, Urine 1.018 1.005 - 1.030   pH 5.0 5.0 - 8.0   Glucose, UA NEGATIVE NEGATIVE mg/dL   Hgb urine dipstick NEGATIVE NEGATIVE   Bilirubin Urine NEGATIVE NEGATIVE   Ketones, ur NEGATIVE NEGATIVE mg/dL   Protein, ur NEGATIVE NEGATIVE mg/dL   Nitrite POSITIVE (A) NEGATIVE   Leukocytes, UA SMALL (A) NEGATIVE   RBC / HPF 0-5 0 - 5 RBC/hpf   WBC, UA 21-50 0 - 5 WBC/hpf   Bacteria, UA MANY (A) NONE SEEN   Squamous Epithelial / LPF 21-50 0 - 5   Mucus PRESENT    Hyaline Casts, UA PRESENT   Troponin I  Result Value Ref Range   Troponin I <0.03 <0.03 ng/mL  D-dimer, quantitative  Result Value Ref Range   D-Dimer, Quant <0.27 0.00 - 0.50 ug/mL-FEU  Troponin I  Result Value Ref Range   Troponin I <0.03 <0.03 ng/mL  CBG monitoring, ED  Result Value Ref Range   Glucose-Capillary 179  (H) 70 - 99 mg/dL  I-Stat beta hCG blood, ED  Result Value Ref Range   I-stat hCG, quantitative <5.0 <5 mIU/mL   Comment 3            EKG EKG Interpretation  Date/Time:  Monday November 20 2017 00:46:46 EDT Ventricular Rate:  87 PR Interval:    QRS Duration: 82 QT Interval:  410 QTC Calculation: 494 R Axis:   57 Text Interpretation:  Sinus rhythm Nonspecific T abnormalities, diffuse leads Borderline prolonged QT interval new inferior and lateral ST depression Confirmed by Ezequiel Essex [phone]) on 11/20/2017 12:50:43 AM   Radiology No results found. No results found.  Procedures Procedures (including critical care time)  Medications Ordered in ED Medications - No data to display   Initial Impression / Assessment and Plan / ED Course  I have reviewed the triage vital signs and the nursing notes.  Pertinent labs & imaging results that were available during my care of the patient were reviewed by me and considered in my medical decision making (see chart for details).     Patient is here for evaluation of episode of near syncope earlier at home. No chest pain. She reports standing up to walk and feeling suddenly like she was going to pass out, including diaphoretic, nausea, SOB.   Here she is found to have an abnormal EKG (ST depression in inferolateral leads) but has a comparable past EKG with similar changes. Normal troponin x 2. VS stable here. Fluids provided after which orthostatics are negative. She reports being asymptomatic with repeat orthostatics. Suspect near syncope related to vasovagal symptoms. Doubt acute cardiac event., however, with symptoms of near syncope, SOB, N, diaphoresis and an abnormal EKG, cardiology was consulted and will see the patient in the ED. Patient and husband  made aware and are agreeable to waiting for cards to see.   UTI found on labs. IV rocephin given here. Potassium supplemented with PO dosing.  If she is felt appropriate for  discharge home, will encourage PCP follow up in 2-3 days. Rx Keflex for UTI, 2 days K+ supplement.   Patient care signed out to Margarita Mail, PA-C, pending cardiology consultation.    Final Clinical Impressions(s) / ED Diagnoses   Final diagnoses:  None   1. Near syncope 2. Orthostatic hyoptension 3. UTI 4. Hypokalemia  ED Discharge Orders    None       Charlann Lange, PA-C 11/20/17 0524    Charlann Lange, PA-C 11/20/17 1017    Ezequiel Essex, MD 11/21/17 [phone]

## 2017-11-20 NOTE — ED Notes (Signed)
Patient verbalizes understanding of discharge instructions. Opportunity for questioning and answers were provided. Armband removed by staff, pt discharged from ED. Abigail PA-C brought to room to further explain EKG changes and answer patient's questions.

## 2017-11-20 NOTE — ED Triage Notes (Signed)
Pt arrives via EMS after a near syncopal episode at home, reports "ice cold feeling." Reports she worked today and and had two glasses of water. Went home and had dinner and a wine cooler and doozed off, when she woke she had abd pain and L arm pain. Reports that's when she started feeling cold. EMS called, reports positive orthostatic vitals. Initial BP 80/50, up to 107/76. CBG 95

## 2017-11-22 ENCOUNTER — Telehealth: Payer: Self-pay

## 2017-11-22 NOTE — Telephone Encounter (Signed)
Follow up cakll made to patient for appointment with provider. States she is going to follow up with Cardiology for now. Advised to call office if she needed appointment. Patient agreed.

## 2017-12-04 ENCOUNTER — Encounter: Payer: Self-pay | Admitting: Cardiology

## 2017-12-04 ENCOUNTER — Ambulatory Visit: Payer: Managed Care, Other (non HMO) | Admitting: Cardiology

## 2017-12-04 ENCOUNTER — Encounter: Payer: Self-pay | Admitting: *Deleted

## 2017-12-04 VITALS — BP 128/88 | HR 75 | Ht <= 58 in | Wt 195.6 lb

## 2017-12-04 DIAGNOSIS — I10 Essential (primary) hypertension: Secondary | ICD-10-CM

## 2017-12-04 DIAGNOSIS — R9431 Abnormal electrocardiogram [ECG] [EKG]: Secondary | ICD-10-CM | POA: Diagnosis not present

## 2017-12-04 DIAGNOSIS — R55 Syncope and collapse: Secondary | ICD-10-CM | POA: Diagnosis not present

## 2017-12-04 DIAGNOSIS — R0789 Other chest pain: Secondary | ICD-10-CM

## 2017-12-04 NOTE — Progress Notes (Signed)
12/04/2017 [place]   1970/08/10  [id number]  Primary Physician [name], NP Primary Cardiologist: Dr Sallyanne Kuster (new)  HPI:  Pleasant 47 y/o married caucasian female, works in the Forensic psychologist at [employer], seen in the office today as a post ED visit new patient. The pt has no prior history of cardiac evaluation. She has treated HTN and depression on Effexor. On 11/20/17 she was sleeping on the couch, woke up and became nauseated and diaphoretic. She then slumped to the floor. Her husband says her speech was slurred. EMS was called and she was transferred to [facility] ED. Labs were unremarkable except for slightly elevated BUN. Her EKG was abnormal (TWI inferior lateral leads) and she was kept to cycle her Troponin (negative). Her EKG was reviewed by a cardiologist at the hospital and she was OK'd for discharge with OP f/u.   Since discharge she denies having any recurrent episodes. She denies chest pain. Her husband does say she snores and the pt admits to poor sleep. She denies tachycardia or palpitations.    Current Outpatient Medications  Medication Sig Dispense Refill  . aspirin-acetaminophen-caffeine (EXCEDRIN MIGRAINE) 250-250-65 MG tablet Take 1-2 tablets by mouth every 6 (six) hours as needed for headache or migraine.     Marland Kitchen augmented betamethasone dipropionate (DIPROLENE-AF) 0.05 % cream Apply topically as directed.    . fluticasone (FLONASE) 50 MCG/ACT nasal spray Place into both nostrils as directed.    . meloxicam (MOBIC) 7.5 MG tablet Take 7.5 mg by mouth once a day 30 tablet 0  . montelukast (SINGULAIR) 10 MG tablet Take 1 tablet (10 mg total) by mouth at bedtime. 30 tablet 11  . Multiple Vitamins-Minerals (MULTIVITAMIN WITH MINERALS) tablet Take 1 tablet by mouth daily.    Marland Kitchen triamcinolone (KENALOG) 0.025 % ointment Apply 1 application topically as directed.    . valsartan-hydrochlorothiazide (DIOVAN-HCT) 160-25 MG tablet TAKE 1 TABLET BY MOUTH ONCE DAILY 90 tablet 1  .  venlafaxine XR (EFFEXOR XR) 37.5 MG 24 hr capsule Take 1 capsule (37.5 mg total) by mouth daily with breakfast. 90 capsule 1  . potassium chloride (K-DUR) 10 MEQ tablet Take 2 tablets (20 mEq total) by mouth 2 (two) times daily for 2 days. 8 tablet 0   No current facility-administered medications for this visit.     Allergies  Allergen Reactions  . Ace Inhibitors Cough    Past Medical History:  Diagnosis Date  . Angio-edema   . Anxiety   . Depression   . Eczema   . HTN (hypertension)     Social History   Socioeconomic History  . Marital status: Married    Spouse name: Not on file  . Number of children: Not on file  . Years of education: Not on file  . Highest education level: Not on file  Occupational History  . Not on file  Social Needs  . Financial resource strain: Not on file  . Food insecurity:    Worry: Not on file    Inability: Not on file  . Transportation needs:    Medical: Not on file    Non-medical: Not on file  Tobacco Use  . Smoking status: Never Smoker  . Smokeless tobacco: Never Used  Substance and Sexual Activity  . Alcohol use: Yes    Alcohol/week: 0.0 standard drinks    Comment: rare  . Drug use: No  . Sexual activity: Yes  Lifestyle  . Physical activity:    Days per week: Not  on file    Minutes per session: Not on file  . Stress: Not on file  Relationships  . Social connections:    Talks on phone: Not on file    Gets together: Not on file    Attends religious service: Not on file    Active member of club or organization: Not on file    Attends meetings of clubs or organizations: Not on file    Relationship status: Not on file  . Intimate partner violence:    Fear of current or ex partner: Not on file    Emotionally abused: Not on file    Physically abused: Not on file    Forced sexual activity: Not on file  Other Topics Concern  . Not on file  Social History Narrative   Works at [employer]   One daughter age 39   One daughter  is age 58   Enjoys cross stitching, playing games on her cell phone.     2 dogs   Completed 12th grade.      Family History  Problem Relation Age of Onset  . Heart disease Father        heart murmur  . Parkinson's disease Father   . Hypertension Mother   . Coronary artery disease Paternal Uncle        2 uncles (both died before 51)  . Coronary artery disease Paternal Aunt        MI, died before 98  . Melanoma Brother 32       melanoma  . Allergic rhinitis Sister      Review of Systems: General: negative for chills, fever, night sweats or weight changes.  Cardiovascular: negative for chest pain, dyspnea on exertion, edema, orthopnea, palpitations, paroxysmal nocturnal dyspnea or shortness of breath Dermatological: negative for rash Respiratory: negative for cough or wheezing Urologic: negative for hematuria Abdominal: negative for nausea, vomiting, diarrhea, bright red blood per rectum, melena, or hematemesis Neurologic: negative for visual changes, syncope, or dizziness Snoring, poor sleep All other systems reviewed and are otherwise negative except as noted above.    Blood pressure 128/88, pulse 75, height 4\' 9"  (1.448 m), weight 195 lb 9.6 oz (88.7 kg), last menstrual period 04/20/2016, SpO2 97 %.  General appearance: alert, cooperative, no distress and morbidly obese Neck: no carotid bruit and no JVD Lungs: clear to auscultation bilaterally Heart: regular rate and rhythm Abdomen: obese, non tender Extremities: no edema Skin: cool and dry Neurologic: Grossly normal  EKG NSR-TWI inferior and lateral leads  ASSESSMENT AND PLAN:   Near syncope Prompting EMS call and ED visit 11/20/17  Abnormal EKG NSST changes- r/o significant ischemia  Morbid obesity (HCC) BMI 42- too big for coronary CTA, high probably of false positive Myoview, will try stress echo.  High suspicion for sleep apnea  Essential hypertension stable   PLAN  Reviewed with Dr Sallyanne Kuster- check  stress echo and sleep study, f/u Dr Sallyanne Kuster in 2-3 months. If she has another episode she'll need a monitor but I suspect she has severe sleep apnea and that's what prompted this past episode.   Kerin Ransom PA-C 12/04/2017 3:19 PM

## 2017-12-04 NOTE — Patient Instructions (Addendum)
Medication Instructions:   Your physician recommends that you continue on your current medications as directed. Please refer to the Current Medication list given to you today.  Labwork:  NONE  Testing/Procedures: Your physician has requested that you have a stress echocardiogram. For further information please visit [email or web address]. Please follow instruction sheet as given.  Follow-Up:  Your physician recommends that you schedule a follow-up appointment in: 2-3 months with Dr. Sallyanne Kuster.  Any Other Special Instructions Will Be Listed Below (If Applicable).  You have been referred to have a sleep study.  If you need a refill on your cardiac medications before your next appointment, please call your pharmacy.

## 2017-12-04 NOTE — Assessment & Plan Note (Signed)
Prompting EMS call and ED visit 11/20/17

## 2017-12-04 NOTE — Assessment & Plan Note (Signed)
stable °

## 2017-12-04 NOTE — Assessment & Plan Note (Signed)
BMI 42- too big for coronary CTA, high probably of false positive Myoview, will try stress echo.  High suspicion for sleep apnea

## 2017-12-04 NOTE — Assessment & Plan Note (Signed)
NSST changes- r/o significant ischemia

## 2017-12-06 ENCOUNTER — Telehealth: Payer: Self-pay | Admitting: *Deleted

## 2017-12-06 NOTE — Telephone Encounter (Signed)
PA request for sleep study submitted to [facility] via web portal.

## 2017-12-06 NOTE — Telephone Encounter (Signed)
-----   Message from Roland Earl sent at 12/04/2017  3:29 PM EDT ----- Regarding: Sleep Study Ordered by Kerin Ransom

## 2017-12-07 ENCOUNTER — Telehealth: Payer: Self-pay | Admitting: *Deleted

## 2017-12-07 ENCOUNTER — Other Ambulatory Visit: Payer: Self-pay | Admitting: Cardiology

## 2017-12-07 DIAGNOSIS — R0683 Snoring: Secondary | ICD-10-CM

## 2017-12-07 DIAGNOSIS — I10 Essential (primary) hypertension: Secondary | ICD-10-CM

## 2017-12-07 NOTE — Telephone Encounter (Signed)
HST appointment details left on cell VM. Both the sleep lab as well as my direct contact # left for patient to call if questions, and/or if appointment needs to bee rescheduled.

## 2017-12-08 ENCOUNTER — Telehealth (HOSPITAL_COMMUNITY): Payer: Self-pay | Admitting: *Deleted

## 2017-12-08 ENCOUNTER — Telehealth: Payer: Self-pay | Admitting: *Deleted

## 2017-12-08 DIAGNOSIS — R0789 Other chest pain: Secondary | ICD-10-CM

## 2017-12-08 DIAGNOSIS — R9431 Abnormal electrocardiogram [ECG] [EKG]: Secondary | ICD-10-CM

## 2017-12-08 NOTE — Telephone Encounter (Signed)
-----   Message from Benancio Deeds sent at 12/08/2017 11:42 AM EDT ----- Regarding: FW: stress echo Can we please have this changed to a GXT and 2d echo please? Insurance is denying the stress echo  Thank you!  Caryl Pina  ----- Message ----- From: Erlene Quan, PA-C Sent: 12/08/2017  10:51 AM EDT To: Benancio Deeds Subject: RE: stress echo                                Yes, thanks  Kerin Ransom PA-C 12/08/2017 10:51 AM ----- Message ----- From: Benancio Deeds Sent: 12/08/2017   8:48 AM EDT To: Erlene Quan, PA-C Subject: stress echo                                    It looks like insurance is trying to deny stress echo due to her ability to walk on a treadmill.  Are you okay with a plain GXT and 2D echo incase this gets denied today?  Please advise.  Thank you,  Caryl Pina

## 2017-12-08 NOTE — Telephone Encounter (Signed)
Left message on voicemail per DPR in reference to upcoming appointment scheduled on 12/11/17 at 2:30 with detailed instructions given per Stress Test Requisition Sheet for the test. LM to arrive 30 minutes early, and that it is imperative to arrive on time for appointment to keep from having the test rescheduled. If you need to cancel or reschedule your appointment, please call the office within 24 hours of your appointment. Failure to do so may result in a cancellation of your appointment, and a $50 no show fee. Phone number given for call back for any questions. Wanda Williams

## 2017-12-11 ENCOUNTER — Other Ambulatory Visit (HOSPITAL_COMMUNITY): Payer: Self-pay

## 2017-12-12 ENCOUNTER — Telehealth (HOSPITAL_COMMUNITY): Payer: Self-pay

## 2017-12-12 NOTE — Telephone Encounter (Signed)
Encounter complete. 

## 2017-12-14 ENCOUNTER — Ambulatory Visit (HOSPITAL_COMMUNITY)
Admission: RE | Admit: 2017-12-14 | Discharge: 2017-12-14 | Disposition: A | Discharge status: Home / Self Care | Payer: Managed Care, Other (non HMO) | Source: Ambulatory Visit | Attending: Internal Medicine | Admitting: Internal Medicine

## 2017-12-14 DIAGNOSIS — R0789 Other chest pain: Secondary | ICD-10-CM

## 2017-12-14 DIAGNOSIS — R9431 Abnormal electrocardiogram [ECG] [EKG]: Secondary | ICD-10-CM

## 2017-12-14 LAB — EXERCISE TOLERANCE TEST
Estimated workload: 8.5 METS
Exercise duration (min): 7 min
Exercise duration (sec): 2 s
MPHR: 171 {beats}/min
Peak HR: 162 {beats}/min
Percent HR: 94 %
RPE: 17
Rest HR: 81 {beats}/min

## 2017-12-15 ENCOUNTER — Ambulatory Visit (HOSPITAL_COMMUNITY): Payer: Managed Care, Other (non HMO) | Attending: Cardiology

## 2017-12-15 ENCOUNTER — Other Ambulatory Visit: Payer: Self-pay

## 2017-12-15 DIAGNOSIS — R9431 Abnormal electrocardiogram [ECG] [EKG]: Secondary | ICD-10-CM | POA: Insufficient documentation

## 2017-12-15 DIAGNOSIS — R0789 Other chest pain: Secondary | ICD-10-CM | POA: Diagnosis present

## 2017-12-20 ENCOUNTER — Encounter (HOSPITAL_BASED_OUTPATIENT_CLINIC_OR_DEPARTMENT_OTHER): Payer: Self-pay

## 2018-01-08 ENCOUNTER — Ambulatory Visit (HOSPITAL_BASED_OUTPATIENT_CLINIC_OR_DEPARTMENT_OTHER): Payer: Managed Care, Other (non HMO) | Attending: Cardiology | Admitting: Cardiovascular Disease

## 2018-01-08 VITALS — Ht <= 58 in | Wt 190.0 lb

## 2018-01-08 DIAGNOSIS — G4733 Obstructive sleep apnea (adult) (pediatric): Secondary | ICD-10-CM | POA: Diagnosis not present

## 2018-01-08 DIAGNOSIS — I10 Essential (primary) hypertension: Secondary | ICD-10-CM | POA: Diagnosis present

## 2018-01-08 DIAGNOSIS — R0683 Snoring: Secondary | ICD-10-CM

## 2018-01-08 DIAGNOSIS — Z6841 Body Mass Index (BMI) 40.0 and over, adult: Secondary | ICD-10-CM | POA: Insufficient documentation

## 2018-01-08 DIAGNOSIS — R0902 Hypoxemia: Secondary | ICD-10-CM | POA: Insufficient documentation

## 2018-01-14 ENCOUNTER — Encounter (HOSPITAL_BASED_OUTPATIENT_CLINIC_OR_DEPARTMENT_OTHER): Payer: Self-pay | Admitting: Cardiovascular Disease

## 2018-01-14 NOTE — Procedures (Signed)
     Patient Name: Wanda Williams, Wanda Williams Date: 01/08/2018 Gender: Female D.O.B: 02-Aug-1970 Age (years): 47 Referring Provider: Kerin Ransom Height (inches): 78 Interpreting Physician: Shelva Majestic MD, ABSM Weight (lbs): 190 RPSGT: Jacolyn Reedy BMI: 41 MRN: [id number] Neck Size: 14.50  CLINICAL INFORMATION Sleep Study Type: HST  Indication for sleep study: Hypertension, Morbid Obesity, Snoring  Epworth Sleepiness Score: 13  SLEEP STUDY TECHNIQUE A multi-channel overnight portable sleep study was performed. The channels recorded were: nasal airflow, thoracic respiratory movement, and oxygen saturation with a pulse oximetry. Snoring was also monitored.  MEDICATIONS     aspirin-acetaminophen-caffeine (EXCEDRIN MIGRAINE) 250-250-65 MG tablet             augmented betamethasone dipropionate (DIPROLENE-AF) 0.05 % cream         fluticasone (FLONASE) 50 MCG/ACT nasal spray         meloxicam (MOBIC) 7.5 MG tablet         montelukast (SINGULAIR) 10 MG tablet         Multiple Vitamins-Minerals (MULTIVITAMIN WITH MINERALS) tablet         potassium chloride (K-DUR) 10 MEQ tablet (Expired)         triamcinolone (KENALOG) 0.025 % ointment         valsartan-hydrochlorothiazide (DIOVAN-HCT) 160-25 MG tablet         venlafaxine XR (EFFEXOR XR) 37.5 MG 24 hr capsule      Patient self administered medications include: N/A.  SLEEP ARCHITECTURE Patient was studied for 383.2 minutes. The sleep efficiency was 98.7 % and the patient was supine for 36.9%. The arousal index was 0.0 per hour.  RESPIRATORY PARAMETERS The overall AHI was 25.2 per hour, with a central apnea index of 0.0 per hour.  The oxygen nadir was 73% during sleep.  Time spent < 89 minutes was 19.3 minutes.  CARDIAC DATA Mean heart rate during sleep was 80.8 bpm.  IMPRESSIONS - Moderate obstructive sleep apnea occurred during this study (AHI 25.2/h). The severity of sleep apnea in REM sleep cannot be assesssed on the  home study. - No significant central sleep apnea occurred during this study (CAI 0.0/h). - Severe oxygen desaturation to a nadir of 73%. - Patient snored 17.1% during the sleep.  DIAGNOSIS - Obstructive Sleep Apnea (327.23 [G47.33 ICD-10]) - Nocturnal Hypoxemia (327.26 [G47.36 ICD-10])  RECOMMENDATIONS - In this patient with cardiovascular comorbidities with significant oxygen desaturation, recommend an in-lab CPAP titration study for optimal evaluation of her sleep apnea, particularly in REM sleep. - Efforts should be made to optimize nasal and oropharyngeal patency. - Avoid alcohol, sedatives and other CNS depressants that may worsen sleep apnea and disrupt normal sleep architecture. - Sleep hygiene should be reviewed to assess factors that may improve sleep quality. - Weight management and regular exercise should be initiated or continued. - Recommend a dowload after 4 weeks of CPAP therapy and sleep clinic evaluation.  [Electronically signed] 01/14/2018 11:16 AM  Shelva Majestic MD, [facility], [place], American Board of Sleep Medicine   NPI: [id number] [place] PH: [phone]   FX: [phone] [place]

## 2018-01-15 ENCOUNTER — Other Ambulatory Visit: Payer: Self-pay | Admitting: Cardiovascular Disease

## 2018-01-15 ENCOUNTER — Telehealth: Payer: Self-pay | Admitting: *Deleted

## 2018-01-15 DIAGNOSIS — I10 Essential (primary) hypertension: Secondary | ICD-10-CM

## 2018-01-15 DIAGNOSIS — G4733 Obstructive sleep apnea (adult) (pediatric): Secondary | ICD-10-CM

## 2018-01-15 NOTE — Telephone Encounter (Signed)
Patient returned a call to me and was given her sleep study results and recommendations. She agrees to proceed with CPAP titration.

## 2018-01-15 NOTE — Progress Notes (Signed)
Patient notified of sleep study results and recommendations. 

## 2018-01-15 NOTE — Telephone Encounter (Signed)
-----   Message from Troy Sine, MD sent at 01/14/2018 11:20 AM EST ----- Wanda Williams, please notify pt and try to arrange an in-lab CPAP titration study.

## 2018-01-15 NOTE — Telephone Encounter (Signed)
Left message to return a call to discuss HST results and recommendations. 

## 2018-01-17 ENCOUNTER — Telehealth: Payer: Self-pay | Admitting: *Deleted

## 2018-01-17 NOTE — Telephone Encounter (Signed)
CPAP titration PA submitted to [facility] via web portal.

## 2018-01-17 NOTE — Telephone Encounter (Signed)
-----   Message from Lauralee Evener, [place] sent at 01/15/2018  2:08 PM EST ----- CPAP titration

## 2018-01-23 ENCOUNTER — Ambulatory Visit: Payer: Managed Care, Other (non HMO) | Admitting: Physician Assistant

## 2018-01-23 ENCOUNTER — Encounter: Payer: Self-pay | Admitting: Physician Assistant

## 2018-01-23 VITALS — BP 146/93 | HR 69 | Ht <= 58 in | Wt 189.6 lb

## 2018-01-23 DIAGNOSIS — R9431 Abnormal electrocardiogram [ECG] [EKG]: Secondary | ICD-10-CM

## 2018-01-23 DIAGNOSIS — R55 Syncope and collapse: Secondary | ICD-10-CM

## 2018-01-23 NOTE — Patient Instructions (Signed)
Medication Instructions:  Your physician recommends that you continue on your current medications as directed. Please refer to the Current Medication list given to you today. If you need a refill on your cardiac medications before your next appointment, please call your pharmacy.   Lab work: None  If you have labs (blood work) drawn today and your tests are completely normal, you will receive your results only by: Marland Kitchen MyChart Message (if you have MyChart) OR . A paper copy in the mail If you have any lab test that is abnormal or we need to change your treatment, we will call you to review the results.  Testing/Procedures: Your physician has recommended that you wear an 30 day event monitor. Event monitors are medical devices that record the heart's electrical activity. Doctors most often [place] these monitors to diagnose arrhythmias. Arrhythmias are problems with the speed or rhythm of the heartbeat. The monitor is a small, portable device. You can wear one while you do your normal daily activities. This is usually used to diagnose what is causing palpitations/syncope (passing out). [place]   Follow-Up: At [facility], you and your health needs are our priority.  As part of our continuing mission to provide you with exceptional heart care, we have created designated Provider Care Teams.  These Care Teams include your primary Cardiologist (physician) and Advanced Practice Providers (APPs -  Physician Assistants and Nurse Practitioners) who all work together to provide you with the care you need, when you need it. . Your physician recommends that you schedule a follow-up appointment in: 3 MONTHS WITH DR Claiborne Billings  Any Other Special Instructions Will Be Listed Below (If Applicable). PATIENT NEEDS TO SWITCH FROM DR CROITORU TO DR Claiborne Billings; PATIENT HAS NEVER MET DR TDHRCBUL. WORK NOTE GIVEN

## 2018-01-23 NOTE — Progress Notes (Signed)
Cardiology Office Note    Date:  01/23/2018   ID:  Wanda Williams, DOB 12/28/1970, MRN [id number]  PCP:  [name], NP  Cardiologist: plan to set up with Dr. Claiborne Billings  Chief Complaint  Patient presents with  . Follow-up    recurrent syncope    History of Present Illness:  Wanda Williams is a 47 y.o. female with past medical history of obesity, hypertension and depression.  She was previously seen by Kerin Ransom on 12/04/2017 for abnormal EKG.  She presented to [facility], ED prior to the visit for nausea and diaphoresis.  Her speech was slurred as well.  Lab work was unremarkable except for elevated BUN.  Her serial troponin was negative as well.  Her EKG was reviewed by a cardiologist in the hospital and she was discharged to be evaluated as outpatient.  Given her abnormal EKG, stress echocardiogram was initially recommended.  However her insurance declined a stress echo, therefore GXT was recommended.  Echocardiogram obtained on 12/15/2017 showed EF 55 to 60%, grade 2 DD, mild MR.  GXT obtained on 12/14/2017 showed no significant ST-T wave changes.  She also recently underwent sleep study and is scheduled to proceed with CPAP titration.  Patient went to ED recently on 01/18/2018 with another syncopal episode.  She says she was visiting a friend in the hospital, while sitting in the room, she had a flushing sensation from the chest up to into her head and subsequently passed out.  It was felt patient likely had a vasovagal episode.    Patient presents today for cardiology office visit.  She has not had any issues since the recent syncope.  She denies any regular dizziness, exertional chest pain or shortness of breath.  Sensation this is the second syncope in the past 2 months, I recommended a 30-day event monitor.  If she continued to have persistent symptoms later despite negative event monitor, we may have to consider either a loop recorder versus tilt table test.    Past Medical  History:  Diagnosis Date  . Angio-edema   . Anxiety   . Depression   . Eczema   . HTN (hypertension)     Past Surgical History:  Procedure Laterality Date  . CESAREAN SECTION    . DILATION AND CURETTAGE OF UTERUS    . THYROID CYST EXCISION    . TUBAL LIGATION  2006    Current Medications: Outpatient Medications Prior to Visit  Medication Sig Dispense Refill  . aspirin-acetaminophen-caffeine (EXCEDRIN MIGRAINE) 250-250-65 MG tablet Take 1-2 tablets by mouth every 6 (six) hours as needed for headache or migraine.     Marland Kitchen augmented betamethasone dipropionate (DIPROLENE-AF) 0.05 % cream Apply topically as directed.    . fluticasone (FLONASE) 50 MCG/ACT nasal spray Place into both nostrils as directed.    . meloxicam (MOBIC) 7.5 MG tablet Take 7.5 mg by mouth once a day 30 tablet 0  . montelukast (SINGULAIR) 10 MG tablet Take 1 tablet (10 mg total) by mouth at bedtime. 30 tablet 11  . Multiple Vitamins-Minerals (MULTIVITAMIN WITH MINERALS) tablet Take 1 tablet by mouth daily.    Marland Kitchen triamcinolone (KENALOG) 0.025 % ointment Apply 1 application topically as directed.    . valsartan-hydrochlorothiazide (DIOVAN-HCT) 160-25 MG tablet TAKE 1 TABLET BY MOUTH ONCE DAILY 90 tablet 1  . venlafaxine XR (EFFEXOR XR) 37.5 MG 24 hr capsule Take 1 capsule (37.5 mg total) by mouth daily with breakfast. 90 capsule 1  . potassium chloride (  K-DUR) 10 MEQ tablet Take 2 tablets (20 mEq total) by mouth 2 (two) times daily for 2 days. 8 tablet 0   No facility-administered medications prior to visit.      Allergies:   Ace inhibitors   Social History   Socioeconomic History  . Marital status: Married    Spouse name: Not on file  . Number of children: Not on file  . Years of education: Not on file  . Highest education level: Not on file  Occupational History  . Not on file  Social Needs  . Financial resource strain: Not on file  . Food insecurity:    Worry: Not on file    Inability: Not on file  .  Transportation needs:    Medical: Not on file    Non-medical: Not on file  Tobacco Use  . Smoking status: Never Smoker  . Smokeless tobacco: Never Used  Substance and Sexual Activity  . Alcohol use: Yes    Alcohol/week: 0.0 standard drinks    Comment: rare  . Drug use: No  . Sexual activity: Yes  Lifestyle  . Physical activity:    Days per week: Not on file    Minutes per session: Not on file  . Stress: Not on file  Relationships  . Social connections:    Talks on phone: Not on file    Gets together: Not on file    Attends religious service: Not on file    Active member of club or organization: Not on file    Attends meetings of clubs or organizations: Not on file    Relationship status: Not on file  Other Topics Concern  . Not on file  Social History Narrative   Works at [employer]   One daughter age 18   One daughter is age 20   Enjoys cross stitching, playing games on her cell phone.     2 dogs   Completed 12th grade.      Family History:  The patient's family history includes Allergic rhinitis in her sister; Coronary artery disease in her paternal aunt and paternal uncle; Heart disease in her father; Hypertension in her mother; Melanoma (age of onset: 74) in her brother; Parkinson's disease in her father.   ROS:   Please see the history of present illness.    ROS All other systems reviewed and are negative.   PHYSICAL EXAM:   VS:  BP (!) 146/93   Pulse 69   Ht _0  (1.448 m)   Wt 189 lb 9.6 oz (86 kg)   LMP 04/20/2016   BMI 41.03 kg/m    GEN: Well nourished, well developed, in no acute distress  HEENT: normal  Neck: no JVD, carotid bruits, or masses Cardiac: RRR; no murmurs, rubs, or gallops,no edema  Respiratory:  clear to auscultation bilaterally, normal work of breathing GI: soft, nontender, nondistended, + BS MS: no deformity or atrophy  Skin: warm and dry, no rash Neuro:  Alert and Oriented x 3, Strength and sensation are intact Psych: euthymic  mood, full affect  Wt Readings from Last 3 Encounters:  01/23/18 189 lb 9.6 oz (86 kg)  01/08/18 190 lb (86.2 kg)  12/04/17 195 lb 9.6 oz (88.7 kg)      Studies/Labs Reviewed:   EKG:  EKG is not ordered today.    Recent Labs: 11/20/2017: BUN 15; Creatinine, Ser 0.76; Hemoglobin 12.8; Platelets 407; Potassium 3.0; Sodium 143   Lipid Panel    Component Value  Date/Time   CHOL 181 12/06/2016 0756   TRIG 128.0 12/06/2016 0756   HDL 49.00 12/06/2016 0756   CHOLHDL 4 12/06/2016 0756   VLDL 25.6 12/06/2016 0756   LDLCALC 106 (H) 12/06/2016 0756    Additional studies/ records that were reviewed today include:   Echo 12/15/2017 LV EF: 55% -   60%  ------------------------------------------------------------------- Indications:      R07.9 Chest pain.  ------------------------------------------------------------------- History:   PMH:  Acquired from the patient and from the patient&'s chart.  Risk factors:  Hypertension. Obese.  ------------------------------------------------------------------- Study Conclusions  - Left ventricle: The cavity size was normal. Wall thickness was   normal. Systolic function was normal. The estimated ejection   fraction was in the range of 55% to 60%. Wall motion was normal;   there were no regional wall motion abnormalities. Features are   consistent with a pseudonormal left ventricular filling pattern,   with concomitant abnormal relaxation and increased filling   pressure (grade 2 diastolic dysfunction). - Mitral valve: There was mild regurgitation.  Impressions:  - Normal LV systolic function; moderate diastolic dysfunction; mild   MR and TR.    GXT 12/14/2017 Study Highlights    Blood pressure demonstrated a normal response to exercise.  There was no ST segment deviation noted during stress.  No T wave inversion was noted during stress.  Negative, adequate stress test.      ASSESSMENT:    1. Syncope, unspecified  syncope type   2. Abnormal EKG      PLAN:  In order of problems listed above:  1. Syncope: Likely vasovagal in nature.  If this is the second time she had a syncope in the past 2 months.  She felt flushing sensation prior to passing out.  Given recurrence of the symptom, I will order a 30-day event monitor to rule out significant arrhythmia.  If event monitor is negative and she keep having recurrent symptoms, may need to consider loop recorder versus tilt table test  2. Abnormal EKG: Recent echocardiogram and GXT were normal, no further work-up.    Medication Adjustments/Labs and Tests Ordered: Current medicines are reviewed at length with the patient today.  Concerns regarding medicines are outlined above.  Medication changes, Labs and Tests ordered today are listed in the Patient Instructions below. Patient Instructions  Medication Instructions:  Your physician recommends that you continue on your current medications as directed. Please refer to the Current Medication list given to you today. If you need a refill on your cardiac medications before your next appointment, please call your pharmacy.   Lab work: None  If you have labs (blood work) drawn today and your tests are completely normal, you will receive your results only by: Marland Kitchen MyChart Message (if you have MyChart) OR . A paper copy in the mail If you have any lab test that is abnormal or we need to change your treatment, we will call you to review the results.  Testing/Procedures: Your physician has recommended that you wear an 30 day event monitor. Event monitors are medical devices that record the heart's electrical activity. Doctors most often [place] these monitors to diagnose arrhythmias. Arrhythmias are problems with the speed or rhythm of the heartbeat. The monitor is a small, portable device. You can wear one while you do your normal daily activities. This is usually used to diagnose what is causing palpitations/syncope  (passing out). [place]   Follow-Up: At [facility], you and your health needs  are our priority.  As part of our continuing mission to provide you with exceptional heart care, we have created designated Provider Care Teams.  These Care Teams include your primary Cardiologist (physician) and Advanced Practice Providers (APPs -  Physician Assistants and Nurse Practitioners) who all work together to provide you with the care you need, when you need it. . Your physician recommends that you schedule a follow-up appointment in: 3 MONTHS WITH DR Claiborne Billings  Any Other Special Instructions Will Be Listed Below (If Applicable). PATIENT NEEDS TO SWITCH FROM DR Sallyanne Kuster TO DR Claiborne Billings; PATIENT HAS NEVER MET DR CROITORU. WORK NOTE GIVEN     Signed, Almyra Deforest, [place]  01/23/2018 1:22 PM    [facility] [place], [place], [place]  [id number] Phone: [phone]; Fax: [phone]

## 2018-01-26 ENCOUNTER — Telehealth: Payer: Self-pay | Admitting: *Deleted

## 2018-01-26 NOTE — Telephone Encounter (Signed)
Left message [employer] denied CPAP titration. Approved APAP through [place]. Orders will be sent to them today. If she does not hear back from them by the beginning of the week contact them for status check. Information was left for her to contact them.

## 2018-01-26 NOTE — Telephone Encounter (Signed)
-----   Message from Lauralee Evener, [place] sent at 01/15/2018  2:08 PM EST ----- CPAP titration

## 2018-01-31 ENCOUNTER — Ambulatory Visit (INDEPENDENT_AMBULATORY_CARE_PROVIDER_SITE_OTHER): Payer: Managed Care, Other (non HMO)

## 2018-01-31 DIAGNOSIS — R55 Syncope and collapse: Secondary | ICD-10-CM

## 2018-02-05 ENCOUNTER — Encounter: Payer: Self-pay | Admitting: Neurology

## 2018-02-05 ENCOUNTER — Encounter: Payer: Self-pay | Admitting: Family

## 2018-02-05 ENCOUNTER — Ambulatory Visit: Payer: Managed Care, Other (non HMO) | Admitting: Family

## 2018-02-05 ENCOUNTER — Other Ambulatory Visit (HOSPITAL_COMMUNITY)
Admission: RE | Admit: 2018-02-05 | Discharge: 2018-02-05 | Disposition: A | Discharge status: Home / Self Care | Payer: Managed Care, Other (non HMO) | Source: Ambulatory Visit | Attending: Family | Admitting: Family

## 2018-02-05 VITALS — BP 129/81 | HR 87 | Temp 98.1°F | Resp 16 | Ht <= 58 in | Wt 192.0 lb

## 2018-02-05 DIAGNOSIS — N760 Acute vaginitis: Secondary | ICD-10-CM

## 2018-02-05 DIAGNOSIS — M545 Low back pain, unspecified: Secondary | ICD-10-CM

## 2018-02-05 DIAGNOSIS — M25511 Pain in right shoulder: Secondary | ICD-10-CM

## 2018-02-05 DIAGNOSIS — E876 Hypokalemia: Secondary | ICD-10-CM | POA: Diagnosis not present

## 2018-02-05 DIAGNOSIS — Z23 Encounter for immunization: Secondary | ICD-10-CM | POA: Diagnosis not present

## 2018-02-05 DIAGNOSIS — H9190 Unspecified hearing loss, unspecified ear: Secondary | ICD-10-CM

## 2018-02-05 DIAGNOSIS — R55 Syncope and collapse: Secondary | ICD-10-CM | POA: Diagnosis not present

## 2018-02-05 DIAGNOSIS — F329 Major depressive disorder, single episode, unspecified: Secondary | ICD-10-CM

## 2018-02-05 DIAGNOSIS — F32A Depression, unspecified: Secondary | ICD-10-CM

## 2018-02-05 LAB — URINALYSIS, ROUTINE W REFLEX MICROSCOPIC
Bilirubin Urine: NEGATIVE
HGB URINE DIPSTICK: NEGATIVE
Ketones, ur: NEGATIVE
LEUKOCYTES UA: NEGATIVE
Nitrite: NEGATIVE
PH: 6 (ref 5.0–8.0)
RBC / HPF: NONE SEEN (ref 0–?)
SPECIFIC GRAVITY, URINE: 1.025 (ref 1.000–1.030)
TOTAL PROTEIN, URINE-UPE24: NEGATIVE
URINE GLUCOSE: NEGATIVE
UROBILINOGEN UA: 0.2 (ref 0.0–1.0)

## 2018-02-05 LAB — BASIC METABOLIC PANEL
BUN: 14 mg/dL (ref 6–23)
CALCIUM: 9 mg/dL (ref 8.4–10.5)
CO2: 29 meq/L (ref 19–32)
CREATININE: 0.59 mg/dL (ref 0.40–1.20)
Chloride: 101 mEq/L (ref 96–112)
GFR: 114.74 mL/min (ref >60.00)
Glucose, Bld: 113 mg/dL — ABNORMAL HIGH (ref 70–99)
Potassium: 3.5 mEq/L (ref 3.5–5.1)
SODIUM: 140 meq/L (ref 135–145)

## 2018-02-05 NOTE — Patient Instructions (Addendum)
Stop effexor. Let me know if you have any issues with mood after you stop. You should be contacted about your referral to Sports med and Neurology.  Please complete lab work prior to leaving.

## 2018-02-05 NOTE — Progress Notes (Signed)
Subjective:    Patient ID: Wanda Williams, female    DOB: 18-Oct-1970, 47 y.o.   MRN: [id number]  HPI  Patient is a 47 yr old female who presents today for follow up of her depression.  We decreased her effexor dose due to low libido.  She reports that her mood remains good.  She feels that most of her issues stem from being fatigued from her work.  HTN- maintained on diovan-HCT.  Reports that she ran out of the potassium which was given to her by the ER doctor few months back so she is no longer taking.  She had a syncopal episode while at [place] on 11/14 while visiting a friend. She was treated for UTI.  She reports that she completed the abx.  Still having low back pain.  Saw cardiology on 11/19.  She is wearing a 30 day event monitor.    Right shoulder pain-she reports that this is been an issue for approximately 2 weeks.  Denies any known injury.  Reports low back pain.  She is wondering if she could have a "kidney infection."  Pain is nonradiating. Review of Systems See HPI  Past Medical History:  Diagnosis Date  . Angio-edema   . Anxiety   . Depression   . Eczema   . HTN (hypertension)      Social History   Socioeconomic History  . Marital status: Married    Spouse name: Not on file  . Number of children: Not on file  . Years of education: Not on file  . Highest education level: Not on file  Occupational History  . Not on file  Social Needs  . Financial resource strain: Not on file  . Food insecurity:    Worry: Not on file    Inability: Not on file  . Transportation needs:    Medical: Not on file    Non-medical: Not on file  Tobacco Use  . Smoking status: Never Smoker  . Smokeless tobacco: Never Used  Substance and Sexual Activity  . Alcohol use: Yes    Alcohol/week: 0.0 standard drinks    Comment: rare  . Drug use: No  . Sexual activity: Yes  Lifestyle  . Physical activity:    Days per week: Not on file    Minutes per session: Not on file  .  Stress: Not on file  Relationships  . Social connections:    Talks on phone: Not on file    Gets together: Not on file    Attends religious service: Not on file    Active member of club or organization: Not on file    Attends meetings of clubs or organizations: Not on file    Relationship status: Not on file  . Intimate partner violence:    Fear of current or ex partner: Not on file    Emotionally abused: Not on file    Physically abused: Not on file    Forced sexual activity: Not on file  Other Topics Concern  . Not on file  Social History Narrative   Works at [employer]   One daughter age 63   One daughter is age 35   Enjoys cross stitching, playing games on her cell phone.     2 dogs   Completed 12th grade.     Past Surgical History:  Procedure Laterality Date  . CESAREAN SECTION    . DILATION AND CURETTAGE OF UTERUS    . THYROID CYST  EXCISION    . TUBAL LIGATION  2006    Family History  Problem Relation Age of Onset  . Heart disease Father        heart murmur  . Parkinson's disease Father   . Hypertension Mother   . Coronary artery disease Paternal Uncle        2 uncles (both died before 64)  . Coronary artery disease Paternal Aunt        MI, died before 13  . Melanoma Brother 63       melanoma  . Allergic rhinitis Sister     Allergies  Allergen Reactions  . Ace Inhibitors Cough    Current Outpatient Medications on File Prior to Visit  Medication Sig Dispense Refill  . aspirin-acetaminophen-caffeine (EXCEDRIN MIGRAINE) 250-250-65 MG tablet Take 1-2 tablets by mouth every 6 (six) hours as needed for headache or migraine.     Marland Kitchen augmented betamethasone dipropionate (DIPROLENE-AF) 0.05 % cream Apply topically as directed.    . fluticasone (FLONASE) 50 MCG/ACT nasal spray Place into both nostrils as directed.    . meloxicam (MOBIC) 7.5 MG tablet Take 7.5 mg by mouth once a day 30 tablet 0  . montelukast (SINGULAIR) 10 MG tablet Take 1 tablet (10 mg total)  by mouth at bedtime. 30 tablet 11  . Multiple Vitamins-Minerals (MULTIVITAMIN WITH MINERALS) tablet Take 1 tablet by mouth daily.    Marland Kitchen triamcinolone (KENALOG) 0.025 % ointment Apply 1 application topically as directed.    . valsartan-hydrochlorothiazide (DIOVAN-HCT) 160-25 MG tablet TAKE 1 TABLET BY MOUTH ONCE DAILY 90 tablet 1  . venlafaxine XR (EFFEXOR XR) 37.5 MG 24 hr capsule Take 1 capsule (37.5 mg total) by mouth daily with breakfast. 90 capsule 1  . potassium chloride (K-DUR) 10 MEQ tablet Take 2 tablets (20 mEq total) by mouth 2 (two) times daily for 2 days. 8 tablet 0   No current facility-administered medications on file prior to visit.     BP 129/81 (BP Location: Left Arm, Patient Position: Sitting, Cuff Size: Small)   Pulse 87   Temp 98.1 F (36.7 C) (Oral)   Resp 16   Ht 4\' 9"  (1.448 m)   Wt 192 lb (87.1 kg)   LMP 04/20/2016   SpO2 97%   BMI 41.55 kg/m       Objective:   Physical Exam  Constitutional: She is oriented to person, place, and time. She appears well-developed and well-nourished.  Cardiovascular: Normal rate, regular rhythm and normal heart sounds.  No murmur heard. Pulmonary/Chest: Effort normal and breath sounds normal. No respiratory distress. She has no wheezes.  Musculoskeletal:  + pain with right shoulder abduction. No tenderness to palpation  Neurological: She is alert and oriented to person, place, and time.  Skin: Skin is warm and dry.  Psychiatric: She has a normal mood and affect. Her behavior is normal. Judgment and thought content normal.          Assessment & Plan:  Depression- mood is stable on 37.5 mg of Effexor and libido is unchanged.  I suggested that she discontinue Effexor to see if this will further help her libido.  She is advised to let me know if she has any issues with mood as she transitions off of Effexor.  Syncope-she has had 2 episodes thus far.  She is working with cardiology and is currently wearing an event monitor.   I have suggested that she see neurology for further evaluation to rule out underlying seizure disorder.  Hypertension-blood pressure is stable continue current medication.  Hypokalemia-will obtain follow-up potassium level.  If low will need to resume K. Dur.  Right shoulder pain-will refer to sports medicine for further evaluation.  Suspect rotator cuff strain.  Vaginitis- we will send urine for cytology to check for yeast and bacterial vaginosis.  Low back pain- suspect musculoskeletal etiology.  Will check urinalysis and culture to rule out UTI.

## 2018-02-06 ENCOUNTER — Ambulatory Visit: Payer: Self-pay | Admitting: Cardiovascular Disease

## 2018-02-06 ENCOUNTER — Encounter: Payer: Self-pay | Admitting: Family

## 2018-02-06 LAB — URINE CULTURE
MICRO NUMBER: 91439367
RESULT: NO GROWTH
SPECIMEN QUALITY:: ADEQUATE

## 2018-02-07 LAB — URINE CYTOLOGY ANCILLARY ONLY
BACTERIAL VAGINITIS: NEGATIVE
CANDIDA VAGINITIS: NEGATIVE

## 2018-02-08 ENCOUNTER — Encounter: Payer: Self-pay | Admitting: Family

## 2018-02-20 ENCOUNTER — Encounter: Payer: Self-pay | Admitting: Family Medicine

## 2018-02-20 ENCOUNTER — Ambulatory Visit: Payer: Managed Care, Other (non HMO) | Admitting: Family Medicine

## 2018-02-20 VITALS — BP 135/80 | HR 82 | Ht <= 58 in | Wt 190.0 lb

## 2018-02-20 DIAGNOSIS — M25511 Pain in right shoulder: Secondary | ICD-10-CM

## 2018-02-20 MED ORDER — DICLOFENAC SODIUM 1 % TD GEL: 2.0000 g | Freq: Four times a day (QID) | TRANSDERMAL | 2 refills | Status: DC

## 2018-02-20 NOTE — Addendum Note (Signed)
Addended by: Dene Gentry on: 02/20/2018 03:58 PM   Modules accepted: Level of Service

## 2018-02-20 NOTE — Patient Instructions (Signed)
You have rotator cuff impingement Try to avoid painful activities (overhead activities, lifting with extended arm) as much as possible. Voltaren gel 4 times a day topically for pain and inflammation. Can take tylenol in addition to this. Subacromial injection may be beneficial to help with pain and to decrease inflammation. Consider physical therapy with transition to home exercise program. Do home exercise program with theraband and scapular stabilization exercises daily 3 sets of 10 once a day. If not improving at follow-up we will consider further imaging, injection, physical therapy, and/or nitro patches. Follow up with me in 1 month.

## 2018-02-20 NOTE — Progress Notes (Signed)
   CC: R shoulder pain   HPI  R shoulder pain -patient presents today with right shoulder pain of about 3 weeks duration.  She has never had trouble with this shoulder before, nor has she had surgery or injury that she knows of.  She also denies a history of surgery or injury to her neck.  She feels that this pain began after an episode where she syncopized in a chair, but did not fall out of the chair.  She is having her syncope worked up with cardiology.  She noticed about 3 days after that incident that her right shoulder was hurting on the front as well as into her right breast.  She used a home massage device which helps some.  She has not tried any oral medications for the pain.  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  She does not know of any chronic musculoskeletal problems.  Pain level 5/10 and dull.  CC, SH/smoking status, and VS noted  Objective: BP 135/80   Pulse 82   Ht 4\' 10"  (1.473 m)   Wt 190 lb (86.2 kg)   LMP 04/20/2016   BMI 39.71 kg/m  Gen: NAD, alert, cooperative, and pleasant. Ext/Right shoulder: No gross deformity.  Negative spurlings bilaterally, normal strength and sensation in bilateral hands and forearms. Positive painful arc on R. No pain to palpation of AC joint, pain with crossover adduction, pain with empty can, normal strength of supraspinatus, infraspinatus, and subscap.  FROM.  NVI distally. Neuro: Alert and oriented, Speech clear, No gross deficits  Left shoulder: No swelling, ecchymoses.  No gross deformity. No TTP. FROM. Strength 5/5 with empty can and resisted internal/external rotation. NV intact distally.  Assessment and plan:  R shoulder pain -less likely cervical etiology with negative Spurling's and normal neurologic exam.  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  Less likely degenerative  arthritis.  Given patient's work-up for syncope, will try to avoid oral NSAIDs.  She would not like to try oral prednisone at this point.  Will start with Voltaren gel, patient will call if cost is an issue.  Could also consider subacromial injection for both diagnostic and therapeutic purposes. Given HEP for shoulder strength with yellow band. Return in 1 month.   Ralene Ok, MD, PGY3 02/20/2018 9:30 AM

## 2018-03-05 ENCOUNTER — Ambulatory Visit: Payer: Self-pay | Admitting: Family

## 2018-03-08 ENCOUNTER — Encounter: Payer: Self-pay | Admitting: Cardiovascular Disease

## 2018-03-23 ENCOUNTER — Ambulatory Visit: Payer: Managed Care, Other (non HMO) | Admitting: Family Medicine

## 2018-03-26 ENCOUNTER — Encounter: Payer: Self-pay | Admitting: Family

## 2018-04-11 ENCOUNTER — Encounter: Payer: Self-pay | Admitting: Family

## 2018-04-11 ENCOUNTER — Ambulatory Visit: Payer: Managed Care, Other (non HMO) | Admitting: Family

## 2018-04-11 VITALS — BP 113/74 | HR 70 | Temp 98.5°F | Resp 16 | Ht <= 58 in | Wt 195.0 lb

## 2018-04-11 DIAGNOSIS — R6882 Decreased libido: Secondary | ICD-10-CM

## 2018-04-11 DIAGNOSIS — F329 Major depressive disorder, single episode, unspecified: Secondary | ICD-10-CM

## 2018-04-11 DIAGNOSIS — G4733 Obstructive sleep apnea (adult) (pediatric): Secondary | ICD-10-CM | POA: Diagnosis not present

## 2018-04-11 DIAGNOSIS — R8290 Unspecified abnormal findings in urine: Secondary | ICD-10-CM

## 2018-04-11 DIAGNOSIS — I10 Essential (primary) hypertension: Secondary | ICD-10-CM

## 2018-04-11 DIAGNOSIS — F32A Depression, unspecified: Secondary | ICD-10-CM

## 2018-04-11 MED ORDER — VALSARTAN-HYDROCHLOROTHIAZIDE 160-25 MG PO TABS: 1.0000 | ORAL_TABLET | Freq: Every day | ORAL | 1 refills | Status: DC

## 2018-04-11 MED ORDER — VENLAFAXINE HCL ER 75 MG PO CP24: 75.0000 mg | ORAL_CAPSULE | Freq: Every day | ORAL | 3 refills | Status: DC

## 2018-04-11 NOTE — Patient Instructions (Signed)
Please go to the lab prior to leaving. Restart effexor.

## 2018-04-11 NOTE — Progress Notes (Signed)
Subjective:    Patient ID: Wanda Williams, female    DOB: 1970/04/13, 48 y.o.   MRN: [id number]  HPI  Patient is a 48 yr old female who presents today for follow up.  [place] visit effexor was discontinued. Mood is not as good.  Denies SI.  Feels like her focus has not been as good off of the effexor.  Low libido- has not improved since effexor.   HTN- she is maintained on diovan-hct.   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  Mild in the left ear. She is working with her audiologist. Has been upset about this.   OSA- reports good compliance with CPAP.  She is feeling rested now which is improved.    Review of Systems See HPI  Past Medical History:  Diagnosis Date  . Angio-edema   . Anxiety   . Depression   . Eczema   . HTN (hypertension)      Social History   Socioeconomic History  . Marital status: Married    Spouse name: Not on file  . Number of children: Not on file  . Years of education: Not on file  . Highest education level: Not on file  Occupational History  . Not on file  Social Needs  . Financial resource strain: Not on file  . Food insecurity:    Worry: Not on file    Inability: Not on file  . Transportation needs:    Medical: Not on file    Non-medical: Not on file  Tobacco Use  . Smoking status: Never Smoker  . Smokeless tobacco: Never Used  Substance and Sexual Activity  . Alcohol use: Yes    Alcohol/week: 0.0 standard drinks    Comment: rare  . Drug use: No  . Sexual activity: Yes  Lifestyle  . Physical activity:    Days per week: Not on file    Minutes per session: Not on file  . Stress: Not on file  Relationships  . Social connections:    Talks on phone: Not on file    Gets together: Not on file    Attends religious service: Not on file    Active member of club or organization: Not on file    Attends meetings of clubs or  organizations: Not on file    Relationship status: Not on file  . Intimate partner violence:    Fear of current or ex partner: Not on file    Emotionally abused: Not on file    Physically abused: Not on file    Forced sexual activity: Not on file  Other Topics Concern  . Not on file  Social History Narrative   Works at [employer]   One daughter age 71   One daughter is age 48   Enjoys cross stitching, playing games on her cell phone.     2 dogs   Completed 12th grade.     Past Surgical History:  Procedure Laterality Date  . CESAREAN SECTION    . DILATION AND CURETTAGE OF UTERUS    . THYROID CYST EXCISION    . TUBAL LIGATION  2006    Family History  Problem Relation Age of Onset  . Heart disease Father        heart murmur  . Parkinson's disease Father   . Hypertension Mother   . Coronary artery disease Paternal  Uncle        2 uncles (both died before 46)  . Coronary artery disease Paternal Aunt        MI, died before 46  . Melanoma Brother 76       melanoma  . Allergic rhinitis Sister     Allergies  Allergen Reactions  . Ace Inhibitors Cough    Current Outpatient Medications on File Prior to Visit  Medication Sig Dispense Refill  . aspirin-acetaminophen-caffeine (EXCEDRIN MIGRAINE) 250-250-65 MG tablet Take 1-2 tablets by mouth every 6 (six) hours as needed for headache or migraine.     Marland Kitchen augmented betamethasone dipropionate (DIPROLENE-AF) 0.05 % cream Apply topically as directed.    . diclofenac sodium (VOLTAREN) 1 % GEL Apply 2 g topically 4 (four) times daily. 3 Tube 2  . fluticasone (FLONASE) 50 MCG/ACT nasal spray Place into both nostrils as directed.    . meloxicam (MOBIC) 7.5 MG tablet Take 7.5 mg by mouth once a day 30 tablet 0  . montelukast (SINGULAIR) 10 MG tablet Take 1 tablet (10 mg total) by mouth at bedtime. 30 tablet 11  . Multiple Vitamins-Minerals (MULTIVITAMIN WITH MINERALS) tablet Take 1 tablet by mouth daily.    Marland Kitchen triamcinolone (KENALOG)  0.025 % ointment Apply 1 application topically as directed.    . valsartan-hydrochlorothiazide (DIOVAN-HCT) 160-25 MG tablet TAKE 1 TABLET BY MOUTH ONCE DAILY 90 tablet 1   No current facility-administered medications on file prior to visit.     BP 113/74 (BP Location: Left Arm, Patient Position: Sitting, Cuff Size: Large)   Pulse 70   Temp 98.5 F (36.9 C) (Oral)   Resp 16   Ht 4\' 10"  (1.473 m)   Wt 195 lb (88.5 kg)   LMP 04/20/2016   SpO2 99%   BMI 40.76 kg/m       Objective:   Physical Exam Constitutional:      Appearance: She is well-developed.  Neck:     Musculoskeletal: Neck supple.     Thyroid: No thyromegaly.  Cardiovascular:     Rate and Rhythm: Normal rate and regular rhythm.     Heart sounds: Normal heart sounds. No murmur.  Pulmonary:     Effort: Pulmonary effort is normal. No respiratory distress.     Breath sounds: Normal breath sounds. No wheezing.  Skin:    General: Skin is warm and dry.  Neurological:     Mental Status: She is alert and oriented to person, place, and time.  Psychiatric:        Behavior: Behavior normal.        Thought Content: Thought content normal.        Judgment: Judgment normal.     Comments: tearful           Assessment & Plan:  HTN- bp stable. Continue current medication.  Depression- uncontrolled. Will restart effexor since her depression has worsened and she saw no improvement in her libido off of effexor.  Low libido- could be related to menopause. I suggested that she schedule an appointment with a counselor to discuss this as well.    OSA- feeling better during the day, needing less naps but still "has no energy." We discussed re-evaluating this next visit back on effexor. If still an issue could consider addition of Nuvigil.  C/o urinary odor- will send urine for culture.

## 2018-04-13 ENCOUNTER — Telehealth: Payer: Self-pay | Admitting: Family

## 2018-04-13 LAB — URINE CULTURE
MICRO NUMBER:: 154380
SPECIMEN QUALITY:: ADEQUATE

## 2018-04-13 MED ORDER — CEPHALEXIN 500 MG PO CAPS: 500.0000 mg | ORAL_CAPSULE | Freq: Two times a day (BID) | ORAL | 0 refills | Status: DC

## 2018-04-13 NOTE — Telephone Encounter (Signed)
Urine is growing bacteria, please begin keflex.

## 2018-04-13 NOTE — Telephone Encounter (Signed)
Results given to patient's husband, advised of antibiotics.

## 2018-04-17 ENCOUNTER — Telehealth: Payer: Self-pay | Admitting: *Deleted

## 2018-04-17 NOTE — Telephone Encounter (Signed)
Received Audiologic Report results from [facility]; forwarded to provider/SLS 02/11

## 2018-04-23 ENCOUNTER — Ambulatory Visit: Payer: Self-pay | Admitting: Neurology

## 2018-05-07 ENCOUNTER — Encounter: Payer: Self-pay | Admitting: Cardiovascular Disease

## 2018-05-07 ENCOUNTER — Ambulatory Visit: Payer: Managed Care, Other (non HMO) | Admitting: Cardiovascular Disease

## 2018-05-07 ENCOUNTER — Ambulatory Visit: Payer: Self-pay | Admitting: Family

## 2018-05-07 VITALS — BP 140/80 | HR 66 | Ht <= 58 in | Wt 192.4 lb

## 2018-05-07 DIAGNOSIS — R55 Syncope and collapse: Secondary | ICD-10-CM | POA: Diagnosis not present

## 2018-05-07 DIAGNOSIS — F32A Depression, unspecified: Secondary | ICD-10-CM

## 2018-05-07 DIAGNOSIS — G4733 Obstructive sleep apnea (adult) (pediatric): Secondary | ICD-10-CM

## 2018-05-07 DIAGNOSIS — I493 Ventricular premature depolarization: Secondary | ICD-10-CM

## 2018-05-07 DIAGNOSIS — I10 Essential (primary) hypertension: Secondary | ICD-10-CM

## 2018-05-07 DIAGNOSIS — F329 Major depressive disorder, single episode, unspecified: Secondary | ICD-10-CM

## 2018-05-07 NOTE — Patient Instructions (Signed)
Medication Instructions:  The current medical regimen is effective;  continue present plan and medications.  If you need a refill on your cardiac medications before your next appointment, please call your pharmacy.   Follow-Up: At [facility], you and your health needs are our priority.  As part of our continuing mission to provide you with exceptional heart care, we have created designated Provider Care Teams.  These Care Teams include your primary Cardiologist (physician) and Advanced Practice Providers (APPs -  Physician Assistants and Nurse Practitioners) who all work together to provide you with the care you need, when you need it. You will need a follow up appointment in 6 months.  Please call our office 2 months in advance to schedule this appointment.  You may see Sanda Klein, MD or one of the following Advanced Practice Providers on your designated Care Team: [place], [place] . Fabian Sharp, PA-C  Any Other Special Instructions Will Be Listed Below (If Applicable). Changed pressures from 6 to 20, changed to 9 to 18.

## 2018-05-07 NOTE — Progress Notes (Signed)
Cardiology Office Note    Date:  05/09/2018   ID:  Wanda Williams, DOB 1970-12-05, MRN [id number]  PCP:  [name], NP  Cardiologist:  Shelva Majestic, MD   No chief complaint on file.  New Cardiology [name] evaluation  History of Present Illness:  Wanda Williams is a 48 y.o. female who presents for initial cardiology sleep evaluation with me.  Wanda Williams works at [employer] at [employer] has been seen in the office recently by Kerin Ransom in September 2019.  She has a history of hypertension as well as depression and had been evaluated in the ER for transient slurred speech.  She ultimately underwent an echo Doppler study which showed an EF of 55 to 55%, grade 2 diastolic dysfunction mild MR.  A routine treadmill test in October 2019 showed no significant ST-T wave abnormalities.  He wore an event monitor from the end of November until the end of December which showed predominant sinus rhythm.  Slowest heart rate was sinus bradycardia at 54 bpm while sleeping and the maximal heart rate was sinus tachycardia at 138.  She had very rare isolated PVCs and 1 instance where there were 3 PVCs and 1 minute.  There were no episodes of A. fib, SVT, VT or pauses.  She underwent a sleep study  due to a high suspicion for sleep apnea was done as a home study number 06/2007.  This revealed moderate obstructive sleep apnea with an AHI of 25.2.  However, since it was a home study the severity of sleep apnea during rem sleep was unable to be assessed.  Of note, however, she had severe oxygen desaturation to a nadir of 73% and snored for 17.1% of sleep.  Insurance denied an in lab CPAP titration and she ultimately was started on AutoPap therapy set up February 14, 2018.  A download was obtained in the office today shows 100% compliance.  She was averaging 7 hours and 26 minutes of CPAP use per night.  She was initially set at a minimum pressure of 6 up to a maximum of 20.  Her 95 percentile average  pressure was 12.8 with a maximum average pressure of 14.7.  He has noticed significantly more energy in the morning she also has noticed a significant reduction in her previous nocturia and now gets up 1 time per night.  Typically goes to bed between 1030 and 11 PM and wakes up between 6 and 7:30 AM.  She works typically from noon until 9:30 PM.  She presents for her initial evaluation.  Epworth sleepiness scale was calculated in the office today this endorsed at 13 visit with still residual daytime sleepiness.  Epworth Sleepiness Scale: Situation   Chance of Dozing/Sleeping (0 = never , 1 = slight chance , 2 = moderate chance , 3 = high chance )   sitting and reading 2   watching TV 1   sitting inactive in a public place 3   being a passenger in a motor vehicle for an hour or more 2   lying down in the afternoon 3   sitting and talking to someone 1   sitting quietly after lunch (no alcohol) 1   while stopped for a few minutes in traffic as the driver 0   Total Score  13     Past Medical History:  Diagnosis Date  . Angio-edema   . Anxiety   . Depression   . Eczema   . HTN (hypertension)  Past Surgical History:  Procedure Laterality Date  . CESAREAN SECTION    . DILATION AND CURETTAGE OF UTERUS    . THYROID CYST EXCISION    . TUBAL LIGATION  2006    Current Medications: Outpatient Medications Prior to Visit  Medication Sig Dispense Refill  . aspirin-acetaminophen-caffeine (EXCEDRIN MIGRAINE) 250-250-65 MG tablet Take 1-2 tablets by mouth every 6 (six) hours as needed for headache or migraine.     Marland Kitchen augmented betamethasone dipropionate (DIPROLENE-AF) 0.05 % cream Apply topically as directed.    . cephALEXin (KEFLEX) 500 MG capsule Take 1 capsule (500 mg total) by mouth 2 (two) times daily. 10 capsule 0  . diclofenac sodium (VOLTAREN) 1 % GEL Apply 2 g topically 4 (four) times daily. 3 Tube 2  . meloxicam (MOBIC) 7.5 MG tablet Take 7.5 mg by mouth once a day 30 tablet 0  .  Multiple Vitamins-Minerals (MULTIVITAMIN WITH MINERALS) tablet Take 1 tablet by mouth daily.    Marland Kitchen triamcinolone (KENALOG) 0.025 % ointment Apply 1 application topically as directed.    . valsartan-hydrochlorothiazide (DIOVAN-HCT) 160-25 MG tablet Take 1 tablet by mouth daily. 90 tablet 1  . venlafaxine XR (EFFEXOR-XR) 75 MG 24 hr capsule Take 1 capsule (75 mg total) by mouth daily with breakfast. 30 capsule 3  . fluticasone (FLONASE) 50 MCG/ACT nasal spray Place into both nostrils as directed.    . montelukast (SINGULAIR) 10 MG tablet Take 1 tablet (10 mg total) by mouth at bedtime. 30 tablet 11   No facility-administered medications prior to visit.      Allergies:   Ace inhibitors   Social History   Socioeconomic History  . Marital status: Married    Spouse name: Not on file  . Number of children: Not on file  . Years of education: Not on file  . Highest education level: Not on file  Occupational History  . Not on file  Social Needs  . Financial resource strain: Not on file  . Food insecurity:    Worry: Not on file    Inability: Not on file  . Transportation needs:    Medical: Not on file    Non-medical: Not on file  Tobacco Use  . Smoking status: Never Smoker  . Smokeless tobacco: Never Used  Substance and Sexual Activity  . Alcohol use: Yes    Alcohol/week: 0.0 standard drinks    Comment: rare  . Drug use: No  . Sexual activity: Yes  Lifestyle  . Physical activity:    Days per week: Not on file    Minutes per session: Not on file  . Stress: Not on file  Relationships  . Social connections:    Talks on phone: Not on file    Gets together: Not on file    Attends religious service: Not on file    Active member of club or organization: Not on file    Attends meetings of clubs or organizations: Not on file    Relationship status: Not on file  Other Topics Concern  . Not on file  Social History Narrative   Works at [employer]   One daughter age 14   One  daughter is age 43   Enjoys cross stitching, playing games on her cell phone.     2 dogs   Completed 12th grade.      Family History:  The patient's family history includes Allergic rhinitis in her sister; Coronary artery disease in her paternal aunt and paternal uncle;  Heart disease in her father; Hypertension in her mother; Melanoma (age of onset: 56) in her brother; Parkinson's disease in her father.   ROS General: Negative; No fevers, chills, or night sweats;  HEENT: Negative; No changes in vision or hearing, sinus congestion, difficulty swallowing Pulmonary: Negative; No cough, wheezing, shortness of breath, hemoptysis Cardiovascular: Negative; No chest pain, presyncope, syncope, palpitations GI: Negative; No nausea, vomiting, diarrhea, or abdominal pain GU: Negative; No dysuria, hematuria, or difficulty voiding Musculoskeletal: Negative; no myalgias, joint pain, or weakness Hematologic/Oncology: Negative; no easy bruising, bleeding Endocrine: Negative; no heat/cold intolerance; no diabetes Neuro: Negative; no changes in balance, headaches Skin: Negative; No rashes or skin lesions Psychiatric: Negative; No behavioral problems, depression Sleep: obstructive sleep apnea, now on CPAP.  , daytime sleepiness, hypersomnolence,  nobruxism, restless legs, hypnogognic hallucinations, no cataplexy Other comprehensive 14 point system review is negative.   PHYSICAL EXAM:   VS:  BP 140/80   Pulse 66   Ht _0  (1.448 m)   Wt 192 lb 6.4 oz (87.3 kg)   LMP 04/20/2016   BMI 41.63 kg/m      Wt Readings from Last 3 Encounters:  05/07/18 192 lb 6.4 oz (87.3 kg)  04/11/18 195 lb (88.5 kg)  02/20/18 190 lb (86.2 kg)    General: Alert, oriented, no distress.  Skin: normal turgor, no rashes, warm and dry HEENT: Normocephalic, atraumatic. Pupils equal round and reactive to light; sclera anicteric; extraocular muscles intact; Fundi ** Nose without nasal septal hypertrophy Mouth/Parynx benign;  Mallinpatti scale Neck: No JVD, no carotid bruits; normal carotid upstroke Lungs: clear to ausculatation and percussion; no wheezing or rales Chest wall: without tenderness to palpitation Heart: PMI not displaced, RRR, s1 s2 normal, 1/6 systolic murmur, no diastolic murmur, no rubs, gallops, thrills, or heaves Abdomen: soft, nontender; no hepatosplenomehaly, BS+; abdominal aorta nontender and not dilated by palpation. Back: no CVA tenderness Pulses 2+ Musculoskeletal: full range of motion, normal strength, no joint deformities Extremities: no clubbing cyanosis or edema, Homan's sign negative  Neurologic: grossly nonfocal; Cranial nerves grossly wnl Psychologic: Normal mood and affect   Studies/Labs Reviewed:   EKG:  EKG is  ordered today.  ECG (independently read by me): Normal sinus rhythm at 66 bpm.  No ectopy.  Normal intervals.  Recent Labs: BMP Latest Ref Rng & Units 02/05/2018 11/20/2017 10/03/2017  Glucose 70 - 99 mg/dL 113(H) 93 101(H)  BUN 6 - 23 mg/dL 14 15 24(H)  Creatinine 0.40 - 1.20 mg/dL 0.59 0.76 0.55  Sodium 135 - 145 mEq/L 140 143 143  Potassium 3.5 - 5.1 mEq/L 3.5 3.0(L) 4.1  Chloride 96 - 112 mEq/L 101 102 104  CO2 19 - 32 mEq/L 29 28 32  Calcium 8.4 - 10.5 mg/dL 9.0 9.5 9.1     Hepatic Function Latest Ref Rng & Units 12/06/2016 09/11/2013 03/15/2012  Total Protein 6.0 - 8.3 g/dL 6.3 6.8 6.9  Albumin 3.5 - 5.2 g/dL 3.7 4.0 4.4  AST 0 - 37 U/L _1 ALT 0 - 35 U/L 32 13 16  Alk Phosphatase 39 - 117 U/L 65 67 74  Total Bilirubin 0.2 - 1.2 mg/dL 0.2 0.2 0.3  Bilirubin, Direct 0.0 - 0.3 mg/dL 0.0 - -    CBC Latest Ref Rng & Units 11/20/2017 03/01/2017 12/06/2016  WBC 4.0 - 10.5 K/uL 12.3(H) 9.9 6.5  Hemoglobin 12.0 - 15.0 g/dL 12.8 12.1 12.5  Hematocrit 36.0 - 46.0 % 38.8 36.5 37.5  Platelets 150 - 400  K/uL 407(H) 390 391.0   Lab Results  Component Value Date   MCV 92.6 11/20/2017   MCV 91.7 03/01/2017   MCV 91.8 12/06/2016   Lab Results  Component  Value Date   TSH 3.23 12/06/2016   Lab Results  Component Value Date   HGBA1C 5.6 11/29/2010     BNP No results found for: BNP  ProBNP No results found for: PROBNP   Lipid Panel     Component Value Date/Time   CHOL 181 12/06/2016 0756   TRIG 128.0 12/06/2016 0756   HDL 49.00 12/06/2016 0756   CHOLHDL 4 12/06/2016 0756   VLDL 25.6 12/06/2016 0756   LDLCALC 106 (H) 12/06/2016 0756     RADIOLOGY: No results found.   Additional studies/ records that were reviewed today include:  The patient's prior office visit evaluations, sleep study, obtained a download in the office, cardiographic and treadmill testing.  ASSESSMENT:    1. Essential hypertension   2. OSA (obstructive sleep apnea)   3. Syncope, unspecified syncope type   4. Morbid obesity ([place])   5. PVC's (premature ventricular contractions)   6. Depression, unspecified depression type     PLAN:  Wanda Williams is a 48 year old female who has a history of hypertension, well as depression.  She also has a history of morbid obesity and BMI today is 41.6.  He was recently found to have at least moderate overall sleep apnea but I suspect her sleep apnea was very severe during rem sleep by virtue of her significant oxygen desaturation to a nadir of 73%.  She has initiated CPAP therapy and has felt significantly improved since initiating treatment.  I reviewed with her the vascular locations of sleep apnea if left untreated.  Her previous Holter monitor showed study had shown occasional PVCs and some mild bradycardia while sleeping.  She is meeting compliance standards on her CPAP download.  However since her 95th percentile pressure is 12.8 I have recommended changing her auto therapy to a minimum of 9 and maximum up to 18.  She still has increased daytime sleepiness with her Epworth sleepiness scale score at 13.  We discussed good sleep hygiene.  We discussed the importance of exercise and weight loss.  Her BMI is consistent with  morbid obesity.  Her blood pressure today is slightly elevated by new guideline criteria at 138/86.  She is now on valsartan HCT 160/25 mg.  This may need to be further titrated but I will not do this presently since is possible further benefit may be obtained from weight loss as well as continued CPAP therapy.  He continues to take Effexor for depression.  I reviewed her event monitor with her in detail.  I will see her in 6 months for reevaluation.   Medication Adjustments/Labs and Tests Ordered: Current medicines are reviewed at length with the patient today.  Concerns regarding medicines are outlined above.  Medication changes, Labs and Tests ordered today are listed in the Patient Instructions below. Patient Instructions  Medication Instructions:  The current medical regimen is effective;  continue present plan and medications.  If you need a refill on your cardiac medications before your next appointment, please call your pharmacy.   Follow-Up: At [facility], you and your health needs are our priority.  As part of our continuing mission to provide you with exceptional heart care, we have created designated Provider Care Teams.  These Care Teams include your primary Cardiologist (physician) and Advanced Practice Providers (APPs -  Physician Assistants and Nurse Practitioners) who all work together to provide you with the care you need, when you need it. You will need a follow up appointment in 6 months.  Please call our office 2 months in advance to schedule this appointment.  You may see Sanda Klein, MD or one of the following Advanced Practice Providers on your designated Care Team: [place], [place] . Fabian Sharp, PA-C  Any Other Special Instructions Will Be Listed Below (If Applicable). Changed pressures from 6 to 20, changed to 9 to 18.      Signed, Shelva Majestic, MD  05/09/2018 6:04 PM    [facility] [address], [place], [place], [place]   [address] Phone: [phone]

## 2018-05-09 ENCOUNTER — Encounter: Payer: Self-pay | Admitting: Cardiovascular Disease

## 2018-05-24 ENCOUNTER — Ambulatory Visit: Payer: Self-pay | Admitting: Cardiovascular Disease

## 2018-08-15 ENCOUNTER — Other Ambulatory Visit: Payer: Self-pay | Admitting: Family

## 2018-08-18 ENCOUNTER — Other Ambulatory Visit: Payer: Self-pay | Admitting: Family

## 2018-08-20 ENCOUNTER — Telehealth: Payer: Self-pay | Admitting: Family

## 2018-08-20 NOTE — Telephone Encounter (Signed)
See mychart.  

## 2018-09-28 ENCOUNTER — Other Ambulatory Visit: Payer: Self-pay

## 2018-09-28 DIAGNOSIS — Z20822 Contact with and (suspected) exposure to covid-19: Secondary | ICD-10-CM

## 2018-10-01 LAB — NOVEL CORONAVIRUS, NAA: SARS-CoV-2, NAA: NOT DETECTED

## 2018-10-16 ENCOUNTER — Other Ambulatory Visit: Payer: Self-pay | Admitting: Family

## 2018-10-16 DIAGNOSIS — Z1231 Encounter for screening mammogram for malignant neoplasm of breast: Secondary | ICD-10-CM

## 2018-10-19 ENCOUNTER — Ambulatory Visit
Admission: RE | Admit: 2018-10-19 | Discharge: 2018-10-19 | Disposition: A | Discharge status: Home / Self Care | Payer: Managed Care, Other (non HMO) | Source: Ambulatory Visit | Attending: Family | Admitting: Family

## 2018-10-19 ENCOUNTER — Other Ambulatory Visit: Payer: Self-pay

## 2018-10-19 DIAGNOSIS — Z1231 Encounter for screening mammogram for malignant neoplasm of breast: Secondary | ICD-10-CM

## 2018-10-22 ENCOUNTER — Other Ambulatory Visit: Payer: Self-pay | Admitting: Family

## 2018-10-22 DIAGNOSIS — R928 Other abnormal and inconclusive findings on diagnostic imaging of breast: Secondary | ICD-10-CM

## 2018-10-25 ENCOUNTER — Ambulatory Visit
Admission: RE | Admit: 2018-10-25 | Discharge: 2018-10-25 | Disposition: A | Discharge status: Home / Self Care | Payer: Managed Care, Other (non HMO) | Source: Ambulatory Visit | Attending: Family | Admitting: Family

## 2018-10-25 ENCOUNTER — Other Ambulatory Visit: Payer: Self-pay

## 2018-10-25 DIAGNOSIS — R928 Other abnormal and inconclusive findings on diagnostic imaging of breast: Secondary | ICD-10-CM

## 2018-11-02 ENCOUNTER — Ambulatory Visit: Payer: Self-pay | Admitting: Surgery

## 2018-11-02 DIAGNOSIS — D242 Benign neoplasm of left breast: Secondary | ICD-10-CM

## 2018-11-02 NOTE — H&P (Signed)
Artis Flock Documented: 11/02/2018 10:30 AM Location: [facility] Patient #: [id number] DOB: 1970-04-18 Married / Language: English / Race: White Female  History of Present Illness Marcello Moores A. Damier Disano MD; 11/02/2018 11:21 AM) Patient words: 48 year old patient recalled from recent screening mammogram for evaluation of a biopsy-proven fibroadenoma upper inner quadrant left breast, that has enlarged. The mass was biopsied April 2018, showing a fibroadenoma. Patient denies any hormone replacement therapy. Patient complains of itching. Otherwise has not noticed a mass growing and is difficult to palpate                EXAM: ULTRASOUND OF THE LEFT BREAST  COMPARISON: Previous exam(s).  FINDINGS: Targeted ultrasound is performed, showing a circumscribed oval gently lobulated hypoechoic mass at 9:30 position 4 cm from the nipple measuring 1.4 x 1.5 x 0.9 cm. Central echogenic biopsy clip artifact is seen.  On the initial ultrasound of this mass, performed in April 2018, it measured 0.8 x 1.0 x 0.6 cm.  IMPRESSION: Enlargement of a previously biopsied mass left breast (fibroadenoma at pathology). Especially given patient's age of 71, surgical consultation for consideration of options, and possible surgical excision is recommended and will be scheduled for the patient.  Ultrasound of the left axilla is negative for lymphadenopathy.  RECOMMENDATION: Surgical consultation for evaluation of enlarging biopsy-proven fibroadenoma in the left breast.  Additionally, annual mammography is recommended in 1 year.  I have discussed the findings and recommendations with the patient. Results were also provided in writing at the conclusion of the visit. If applicable, a reminder letter will be sent to the patient regarding the next appointment.  BI-RADS CATEGORY 4: Suspicious.  Electronically Signed: By: Curlene Dolphin M.D. On: 10/25/2018  14:17          Diagnosis Breast, left, needle core biopsy, 9:30 o'clock - FIBROADENOMA - NO CARCINOMA IDENTIFIED Microscopic Comment The results were called to [facility] on June 24, 2016. Thressa Sheller MD.  The patient is a 48 year old female.   Past Surgical History Sharyn Lull R. Rolena Infante, CMA; 11/02/2018 10:31 AM) Cesarean Section - 1  Diagnostic Studies History Sharyn Lull R. Brooks, CMA; 11/02/2018 10:31 AM) Colonoscopy never Pap Smear 1-5 years ago  Allergies Sharyn Lull R. Brooks, CMA; 11/02/2018 10:32 AM) ACE Inhibitors  Medication History Sharyn Lull R. Brooks, CMA; 11/02/2018 10:34 AM) Venlafaxine HCl ER (75MG  Capsule ER 24HR, Oral) Active. Valsartan-hydroCHLOROthiazide (160-25MG  Tablet, Oral) Active. Kenalog (0.025% Cream, External) Active. Multi-Vitamin (Oral) Active. Meloxicam (7.5MG  Tablet, Oral) Active. Voltaren (1% Gel, Transdermal) Active. Diprolene (0.05% Cream, External) Active. Excedrin Migraine (250-250-65MG  Tablet, Oral) Active. Medications Reconciled  Social History Sharyn Lull R. Brooks, CMA; 11/02/2018 10:31 AM) Alcohol use Occasional alcohol use. Caffeine use Coffee, Tea. No drug use  Family History Sharyn Lull R. Brooks, CMA; 11/02/2018 10:31 AM) Heart Disease Father. Prostate Cancer Father.  Pregnancy / Birth History Sharyn Lull R. Rolena Infante, CMA; 11/02/2018 10:31 AM) Age at menarche 57 years. Gravida 4 Length (months) of breastfeeding 3-6 Maternal age 26-30  Other Problems Sharyn Lull R. Brooks, CMA; 11/02/2018 10:31 AM) Anxiety Disorder Depression Lump In Breast     Review of Systems (Lilymarie Scroggins A. Malan Werk MD; 11/02/2018 11:22 AM) General Not Present- Appetite Loss, Chills, Fatigue, Fever, Night Sweats, Weight Gain and Weight Loss. Respiratory Present- Snoring. Not Present- Bloody sputum, Chronic Cough, Difficulty Breathing and Wheezing. Breast Present- Breast Mass. Not Present- Breast Pain, Nipple Discharge  and Skin Changes. Gastrointestinal Not Present- Abdominal Pain, Bloating, Bloody Stool, Change in Bowel Habits, Chronic diarrhea, Constipation, Difficulty  Swallowing, Excessive gas, Gets full quickly at meals, Hemorrhoids, Indigestion, Nausea, Rectal Pain and Vomiting. Female Genitourinary Not Present- Frequency, Nocturia, Painful Urination, Pelvic Pain and Urgency. Psychiatric Present- Anxiety and Frequent crying. Not Present- Bipolar, Change in Sleep Pattern, Depression and Fearful. Endocrine Present- Hot flashes. Not Present- Cold Intolerance, Excessive Hunger, Hair Changes, Heat Intolerance and New Diabetes. All other systems negative  Vitals [employer] R. Brooks CMA; 11/02/2018 10:31 AM) 11/02/2018 10:30 AM Weight: 201.13 lb Height: 57in Body Surface Area: 1.8 m Body Mass Index: 43.52 kg/m  Pulse: 96 (Regular)  BP: 128/86 (Sitting, Left Arm, Standard)        Physical Exam (Taliya Mcclard A. Marcelyn Ruppe MD; 11/02/2018 11:22 AM)  General Mental Status-Alert. General Appearance-Consistent with stated age. Hydration-Well hydrated. Voice-Normal.  Head and Neck Head-normocephalic, atraumatic with no lesions or palpable masses. Trachea-midline. Thyroid Gland Characteristics - normal size and consistency.  Eye Eyeball - Bilateral-Extraocular movements intact. Sclera/Conjunctiva - Bilateral-No scleral icterus.  Breast Note: Left medial masses crus biopsy changes. The area feels to be about a centimeter and a half in maximal diameter. The location in her lower left breast right breast normal  Cardiovascular Cardiovascular examination reveals -normal heart sounds, regular rate and rhythm with no murmurs and normal pedal pulses bilaterally.  Neurologic Neurologic evaluation reveals -alert and oriented x 3 with no impairment of recent or remote memory. Mental Status-Normal.  Lymphatic Head & Neck  General Head & Neck Lymphatics: Bilateral - Description -  Normal. Axillary  General Axillary Region: Bilateral - Description - Normal. Tenderness - Non Tender.    Assessment & Plan (Christyne Mccain A. Gearldene Fiorenza MD; 11/02/2018 11:24 AM)  Rodman Comp, LEFT (D24.2) Impression: Mass has changed in size therefore recommend lumpectomy given change. Risk of lumpectomy include bleeding, infection, seroma, more surgery, use of seed/wire, wound care, cosmetic deformity and the need for other treatments, death , blood clots, death. Pt agrees to proceed.  Current Plans You are being scheduled for surgery- Our schedulers will call you.  You should hear from our office's scheduling department within 5 working days about the location, date, and time of surgery. We try to make accommodations for patient's preferences in scheduling surgery, but sometimes the OR schedule or the surgeon's schedule prevents [place] from making those accommodations.  If you have not heard from our office [phone]) in 5 working days, call the office and ask for your surgeon's nurse.  If you have other questions about your diagnosis, plan, or surgery, call the office and ask for your surgeon's nurse.  Pt Education - [facility] Breast Biopsy HCI: discussed with patient and provided information.

## 2018-11-02 NOTE — H&P (View-Only) (Signed)
Artis Flock Documented: 11/02/2018 10:30 AM Location: [facility] Patient #: [id number] DOB: May 25, 1970 Married / Language: English / Race: White Female  History of Present Illness Marcello Moores A. Avarose Mervine MD; 11/02/2018 11:21 AM) Patient words: 48 year old patient recalled from recent screening mammogram for evaluation of a biopsy-proven fibroadenoma upper inner quadrant left breast, that has enlarged. The mass was biopsied April 2018, showing a fibroadenoma. Patient denies any hormone replacement therapy. Patient complains of itching. Otherwise has not noticed a mass growing and is difficult to palpate                EXAM: ULTRASOUND OF THE LEFT BREAST  COMPARISON: Previous exam(s).  FINDINGS: Targeted ultrasound is performed, showing a circumscribed oval gently lobulated hypoechoic mass at 9:30 position 4 cm from the nipple measuring 1.4 x 1.5 x 0.9 cm. Central echogenic biopsy clip artifact is seen.  On the initial ultrasound of this mass, performed in April 2018, it measured 0.8 x 1.0 x 0.6 cm.  IMPRESSION: Enlargement of a previously biopsied mass left breast (fibroadenoma at pathology). Especially given patient's age of 48, surgical consultation for consideration of options, and possible surgical excision is recommended and will be scheduled for the patient.  Ultrasound of the left axilla is negative for lymphadenopathy.  RECOMMENDATION: Surgical consultation for evaluation of enlarging biopsy-proven fibroadenoma in the left breast.  Additionally, annual mammography is recommended in 1 year.  I have discussed the findings and recommendations with the patient. Results were also provided in writing at the conclusion of the visit. If applicable, a reminder letter will be sent to the patient regarding the next appointment.  BI-RADS CATEGORY 4: Suspicious.  Electronically Signed: By: Curlene Dolphin M.D. On: 10/25/2018  14:17          Diagnosis Breast, left, needle core biopsy, 9:30 o'clock - FIBROADENOMA - NO CARCINOMA IDENTIFIED Microscopic Comment The results were called to [facility] on June 24, 2016. Thressa Sheller MD.  The patient is a 48 year old female.   Past Surgical History Sharyn Lull R. Rolena Infante, CMA; 11/02/2018 10:31 AM) Cesarean Section - 1  Diagnostic Studies History Sharyn Lull R. Brooks, CMA; 11/02/2018 10:31 AM) Colonoscopy never Pap Smear 1-5 years ago  Allergies Sharyn Lull R. Brooks, CMA; 11/02/2018 10:32 AM) ACE Inhibitors  Medication History Sharyn Lull R. Brooks, CMA; 11/02/2018 10:34 AM) Venlafaxine HCl ER (75MG  Capsule ER 24HR, Oral) Active. Valsartan-hydroCHLOROthiazide (160-25MG  Tablet, Oral) Active. Kenalog (0.025% Cream, External) Active. Multi-Vitamin (Oral) Active. Meloxicam (7.5MG  Tablet, Oral) Active. Voltaren (1% Gel, Transdermal) Active. Diprolene (0.05% Cream, External) Active. Excedrin Migraine (250-250-65MG  Tablet, Oral) Active. Medications Reconciled  Social History Sharyn Lull R. Brooks, CMA; 11/02/2018 10:31 AM) Alcohol use Occasional alcohol use. Caffeine use Coffee, Tea. No drug use  Family History Sharyn Lull R. Brooks, CMA; 11/02/2018 10:31 AM) Heart Disease Father. Prostate Cancer Father.  Pregnancy / Birth History Sharyn Lull R. Rolena Infante, CMA; 11/02/2018 10:31 AM) Age at menarche 55 years. Gravida 4 Length (months) of breastfeeding 3-6 Maternal age 83-30  Other Problems Sharyn Lull R. Brooks, CMA; 11/02/2018 10:31 AM) Anxiety Disorder Depression Lump In Breast     Review of Systems (Alister Staver A. Nada Godley MD; 11/02/2018 11:22 AM) General Not Present- Appetite Loss, Chills, Fatigue, Fever, Night Sweats, Weight Gain and Weight Loss. Respiratory Present- Snoring. Not Present- Bloody sputum, Chronic Cough, Difficulty Breathing and Wheezing. Breast Present- Breast Mass. Not Present- Breast Pain, Nipple Discharge  and Skin Changes. Gastrointestinal Not Present- Abdominal Pain, Bloating, Bloody Stool, Change in Bowel Habits, Chronic diarrhea, Constipation, Difficulty  Swallowing, Excessive gas, Gets full quickly at meals, Hemorrhoids, Indigestion, Nausea, Rectal Pain and Vomiting. Female Genitourinary Not Present- Frequency, Nocturia, Painful Urination, Pelvic Pain and Urgency. Psychiatric Present- Anxiety and Frequent crying. Not Present- Bipolar, Change in Sleep Pattern, Depression and Fearful. Endocrine Present- Hot flashes. Not Present- Cold Intolerance, Excessive Hunger, Hair Changes, Heat Intolerance and New Diabetes. All other systems negative  Vitals [employer] R. Brooks CMA; 11/02/2018 10:31 AM) 11/02/2018 10:30 AM Weight: 201.13 lb Height: 57in Body Surface Area: 1.8 m Body Mass Index: 43.52 kg/m  Pulse: 96 (Regular)  BP: 128/86 (Sitting, Left Arm, Standard)        Physical Exam (Jacqulene Huntley A. Larz Mark MD; 11/02/2018 11:22 AM)  General Mental Status-Alert. General Appearance-Consistent with stated age. Hydration-Well hydrated. Voice-Normal.  Head and Neck Head-normocephalic, atraumatic with no lesions or palpable masses. Trachea-midline. Thyroid Gland Characteristics - normal size and consistency.  Eye Eyeball - Bilateral-Extraocular movements intact. Sclera/Conjunctiva - Bilateral-No scleral icterus.  Breast Note: Left medial masses crus biopsy changes. The area feels to be about a centimeter and a half in maximal diameter. The location in her lower left breast right breast normal  Cardiovascular Cardiovascular examination reveals -normal heart sounds, regular rate and rhythm with no murmurs and normal pedal pulses bilaterally.  Neurologic Neurologic evaluation reveals -alert and oriented x 3 with no impairment of recent or remote memory. Mental Status-Normal.  Lymphatic Head & Neck  General Head & Neck Lymphatics: Bilateral - Description -  Normal. Axillary  General Axillary Region: Bilateral - Description - Normal. Tenderness - Non Tender.    Assessment & Plan (Ruweyda Macknight A. Nikkia Devoss MD; 11/02/2018 11:24 AM)  Rodman Comp, LEFT (D24.2) Impression: Mass has changed in size therefore recommend lumpectomy given change. Risk of lumpectomy include bleeding, infection, seroma, more surgery, use of seed/wire, wound care, cosmetic deformity and the need for other treatments, death , blood clots, death. Pt agrees to proceed.  Current Plans You are being scheduled for surgery- Our schedulers will call you.  You should hear from our office's scheduling department within 5 working days about the location, date, and time of surgery. We try to make accommodations for patient's preferences in scheduling surgery, but sometimes the OR schedule or the surgeon's schedule prevents [place] from making those accommodations.  If you have not heard from our office [phone]) in 5 working days, call the office and ask for your surgeon's nurse.  If you have other questions about your diagnosis, plan, or surgery, call the office and ask for your surgeon's nurse.  Pt Education - [facility] Breast Biopsy HCI: discussed with patient and provided information.

## 2018-11-14 ENCOUNTER — Other Ambulatory Visit: Payer: Self-pay | Admitting: Surgery

## 2018-11-14 DIAGNOSIS — D242 Benign neoplasm of left breast: Secondary | ICD-10-CM

## 2018-11-20 ENCOUNTER — Encounter (HOSPITAL_BASED_OUTPATIENT_CLINIC_OR_DEPARTMENT_OTHER): Payer: Self-pay | Admitting: *Deleted

## 2018-11-20 ENCOUNTER — Other Ambulatory Visit: Payer: Self-pay

## 2018-11-23 ENCOUNTER — Other Ambulatory Visit (HOSPITAL_COMMUNITY): Payer: Managed Care, Other (non HMO)

## 2018-11-23 ENCOUNTER — Encounter (HOSPITAL_BASED_OUTPATIENT_CLINIC_OR_DEPARTMENT_OTHER)
Admission: RE | Admit: 2018-11-23 | Discharge: 2018-11-23 | Disposition: A | Discharge status: Home / Self Care | Payer: Managed Care, Other (non HMO) | Source: Ambulatory Visit | Attending: Surgery | Admitting: Surgery

## 2018-11-23 ENCOUNTER — Other Ambulatory Visit (HOSPITAL_COMMUNITY)
Admission: RE | Admit: 2018-11-23 | Discharge: 2018-11-23 | Disposition: A | Discharge status: Home / Self Care | Payer: Managed Care, Other (non HMO) | Source: Ambulatory Visit | Attending: Surgery | Admitting: Surgery

## 2018-11-23 ENCOUNTER — Other Ambulatory Visit: Payer: Self-pay

## 2018-11-23 DIAGNOSIS — Z01812 Encounter for preprocedural laboratory examination: Secondary | ICD-10-CM | POA: Insufficient documentation

## 2018-11-23 DIAGNOSIS — Z20828 Contact with and (suspected) exposure to other viral communicable diseases: Secondary | ICD-10-CM | POA: Diagnosis not present

## 2018-11-23 DIAGNOSIS — D242 Benign neoplasm of left breast: Secondary | ICD-10-CM | POA: Diagnosis not present

## 2018-11-23 LAB — CBC WITH DIFFERENTIAL/PLATELET
Abs Immature Granulocytes: 0.02 10*3/uL (ref 0.00–0.07)
Basophils Absolute: 0 10*3/uL (ref 0.0–0.1)
Basophils Relative: 1 %
Eosinophils Absolute: 0.2 10*3/uL (ref 0.0–0.5)
Eosinophils Relative: 2 %
HCT: 37.1 % (ref 36.0–46.0)
Hemoglobin: 12.4 g/dL (ref 12.0–15.0)
Immature Granulocytes: 0 %
Lymphocytes Relative: 31 %
Lymphs Abs: 2.4 10*3/uL (ref 0.7–4.0)
MCH: 30.8 pg (ref 26.0–34.0)
MCHC: 33.4 g/dL (ref 30.0–36.0)
MCV: 92.1 fL (ref 80.0–100.0)
Monocytes Absolute: 0.5 10*3/uL (ref 0.1–1.0)
Monocytes Relative: 7 %
Neutro Abs: 4.6 10*3/uL (ref 1.7–7.7)
Neutrophils Relative %: 59 %
Platelets: 397 10*3/uL (ref 150–400)
RBC: 4.03 MIL/uL (ref 3.87–5.11)
RDW: 13.3 % (ref 11.5–15.5)
WBC: 7.7 10*3/uL (ref 4.0–10.5)
nRBC: 0 % (ref 0.0–0.2)

## 2018-11-23 LAB — COMPREHENSIVE METABOLIC PANEL
ALT: 26 U/L (ref 0–44)
AST: 23 U/L (ref 15–41)
Albumin: 3.7 g/dL (ref 3.5–5.0)
Alkaline Phosphatase: 78 U/L (ref 38–126)
Anion gap: 6 (ref 5–15)
BUN: 17 mg/dL (ref 6–20)
CO2: 32 mmol/L (ref 22–32)
Calcium: 9.2 mg/dL (ref 8.9–10.3)
Chloride: 103 mmol/L (ref 98–111)
Creatinine, Ser: 0.53 mg/dL (ref 0.44–1.00)
GFR calc Af Amer: >60 mL/min (ref >60)
GFR calc non Af Amer: >60 mL/min (ref >60)
Glucose, Bld: 115 mg/dL — ABNORMAL HIGH (ref 70–99)
Potassium: 3.7 mmol/L (ref 3.5–5.1)
Sodium: 141 mmol/L (ref 135–145)
Total Bilirubin: 0.5 mg/dL (ref 0.3–1.2)
Total Protein: 6.8 g/dL (ref 6.5–8.1)

## 2018-11-23 NOTE — Progress Notes (Signed)

## 2018-11-24 ENCOUNTER — Other Ambulatory Visit: Payer: Self-pay | Admitting: Family

## 2018-11-24 ENCOUNTER — Encounter: Payer: Self-pay | Admitting: Family

## 2018-11-24 LAB — NOVEL CORONAVIRUS, NAA (HOSP ORDER, SEND-OUT TO REF LAB; TAT 18-24 HRS): SARS-CoV-2, NAA: NOT DETECTED

## 2018-11-26 ENCOUNTER — Other Ambulatory Visit: Payer: Self-pay

## 2018-11-26 ENCOUNTER — Telehealth (INDEPENDENT_AMBULATORY_CARE_PROVIDER_SITE_OTHER): Payer: Managed Care, Other (non HMO) | Admitting: Family

## 2018-11-26 VITALS — BP 139/98 | HR 81 | Wt 200.0 lb

## 2018-11-26 DIAGNOSIS — F418 Other specified anxiety disorders: Secondary | ICD-10-CM

## 2018-11-26 DIAGNOSIS — I10 Essential (primary) hypertension: Secondary | ICD-10-CM | POA: Diagnosis not present

## 2018-11-26 DIAGNOSIS — Z1211 Encounter for screening for malignant neoplasm of colon: Secondary | ICD-10-CM

## 2018-11-26 DIAGNOSIS — R635 Abnormal weight gain: Secondary | ICD-10-CM

## 2018-11-26 DIAGNOSIS — M1990 Unspecified osteoarthritis, unspecified site: Secondary | ICD-10-CM

## 2018-11-26 DIAGNOSIS — N63 Unspecified lump in unspecified breast: Secondary | ICD-10-CM

## 2018-11-26 MED ORDER — VENLAFAXINE HCL ER 75 MG PO CP24: ORAL_CAPSULE | ORAL | 5 refills | Status: DC

## 2018-11-26 MED ORDER — AMLODIPINE BESYLATE 2.5 MG PO TABS: 2.5000 mg | ORAL_TABLET | Freq: Every day | ORAL | 3 refills | Status: DC

## 2018-11-26 MED ORDER — MELOXICAM 7.5 MG PO TABS: ORAL_TABLET | ORAL | 3 refills | Status: DC

## 2018-11-26 NOTE — Telephone Encounter (Signed)
Please advise pt that a 30 day refill was sent of her medication.  She is past due for follow up. Let's see if we can get her in for a virtual visit today.

## 2018-11-26 NOTE — Progress Notes (Signed)
Virtual Visit via Video Note  I connected with Wanda Williams on 11/26/18 at  9:40 AM EDT by a video enabled telemedicine application and verified that I am speaking with the correct person using two identifiers.  Location: Patient: home Provider: home   I discussed the limitations of evaluation and management by telemedicine and the availability of in person appointments. The patient expressed understanding and agreed to proceed.  History of Present Illness:  Patient is a 48 yr old female who presents today for follow up.  HTN-  Current medications include diovan-hct 160-25mg . Reports that she has some mild LE edema due to recent increased sodium intake.   Wt Readings from Last 3 Encounters:  05/07/18 192 lb 6.4 oz (87.3 kg)  04/11/18 195 lb (88.5 kg)  02/20/18 190 lb (86.2 kg)   Lab Results  Component Value Date   TSH 3.23 12/06/2016    BP Readings from Last 3 Encounters:  05/07/18 140/80  04/11/18 113/74  02/20/18 135/80   Depression- maintained on effexor xr 75mg . Reports that her mood has been stable.   R foot pain- reports that this continues and she is using meloxicam prn which helps.    She has a lumpectomy scheduled for tomorrow for a breast fibroadenoma. She had a biopsy 4/18 which showed fibroadenoma, but when she underwent follow up breast imaging, it had increased in size from 0.8 x 1.0 x 0.6 to 1.4 x 1.5 x 0.9 cm.  Past Medical History:  Diagnosis Date  . Angio-edema   . Anxiety   . Depression   . Eczema   . HTN (hypertension)   . Sleep apnea    uses CPAP nightly     Social History   Socioeconomic History  . Marital status: Married    Spouse name: Not on file  . Number of children: Not on file  . Years of education: Not on file  . Highest education level: Not on file  Occupational History  . Not on file  Social Needs  . Financial resource strain: Not on file  . Food insecurity    Worry: Not on file    Inability: Not on file  .  Transportation needs    Medical: Not on file    Non-medical: Not on file  Tobacco Use  . Smoking status: Never Smoker  . Smokeless tobacco: Never Used  Substance and Sexual Activity  . Alcohol use: Yes    Alcohol/week: 0.0 standard drinks    Comment: rare  . Drug use: No  . Sexual activity: Yes    Birth control/protection: Surgical    Comment: BTL  Lifestyle  . Physical activity    Days per week: Not on file    Minutes per session: Not on file  . Stress: Not on file  Relationships  . Social Herbalist on phone: Not on file    Gets together: Not on file    Attends religious service: Not on file    Active member of club or organization: Not on file    Attends meetings of clubs or organizations: Not on file    Relationship status: Not on file  . Intimate partner violence    Fear of current or ex partner: Not on file    Emotionally abused: Not on file    Physically abused: Not on file    Forced sexual activity: Not on file  Other Topics Concern  . Not on file  Social History Narrative  Works at [employer]   One daughter age 37   One daughter is age 23   Enjoys cross stitching, playing games on her cell phone.     2 dogs   Completed 12th grade.     Past Surgical History:  Procedure Laterality Date  . CESAREAN SECTION    . DILATION AND CURETTAGE OF UTERUS    . THYROID CYST EXCISION    . TUBAL LIGATION  2006    Family History  Problem Relation Age of Onset  . Heart disease Father        heart murmur  . Parkinson's disease Father   . Hypertension Mother   . Coronary artery disease Paternal Uncle        2 uncles (both died before 66)  . Coronary artery disease Paternal Aunt        MI, died before 3  . Melanoma Brother 26       melanoma  . Allergic rhinitis Sister     Allergies  Allergen Reactions  . Ace Inhibitors Cough    Current Outpatient Medications on File Prior to Visit  Medication Sig Dispense Refill  .  aspirin-acetaminophen-caffeine (EXCEDRIN MIGRAINE) 250-250-65 MG tablet Take 1-2 tablets by mouth every 6 (six) hours as needed for headache or migraine.     Marland Kitchen augmented betamethasone dipropionate (DIPROLENE-AF) 0.05 % cream Apply topically as directed.    . diclofenac sodium (VOLTAREN) 1 % GEL Apply 2 g topically 4 (four) times daily. 3 Tube 2  . meloxicam (MOBIC) 7.5 MG tablet Take 7.5 mg by mouth once a day 30 tablet 0  . Multiple Vitamins-Minerals (MULTIVITAMIN WITH MINERALS) tablet Take 1 tablet by mouth daily.    Marland Kitchen triamcinolone (KENALOG) 0.025 % ointment Apply 1 application topically as directed.    . valsartan-hydrochlorothiazide (DIOVAN-HCT) 160-25 MG tablet TAKE ONE TABLET BY MOUTH DAILY 30 tablet 0  . venlafaxine XR (EFFEXOR-XR) 75 MG 24 hr capsule TAKE ONE CAPSULE BY MOUTH DAILY WITH BREAKFAST 60 capsule 2   No current facility-administered medications on file prior to visit.     LMP 04/20/2016 Comment: BTL   Observations/Objective:   Gen: Awake, alert, no acute distress Resp: Breathing is even and non-labored Psych: calm/pleasant demeanor Neuro: Alert and Oriented x 3, + facial symmetry, speech is clear.   Assessment and Plan:  HTN-  DBP is elevated. Will add amlodipine 2.5mg  once daily. Continue diovan hct.  Weight gain- plan to check follow up TSH when she comes in for face to face in 2 weeks.   Arthritis- continue meloxicam prn.  Depression- stable on current dose of effexor, continue same.  Breast mass- scheduled for lumpectomy tomorrow.   Follow Up Instructions:    I discussed the assessment and treatment plan with the patient. The patient was provided an opportunity to ask questions and all were answered. The patient agreed with the plan and demonstrated an understanding of the instructions.   The patient was advised to call back or seek an in-person evaluation if the symptoms worsen or if the condition fails to improve as anticipated.  [name], NP

## 2018-11-26 NOTE — Patient Instructions (Signed)
Add amlodipine 2.5 once daily.

## 2018-11-26 NOTE — Telephone Encounter (Signed)
Patient was scheduled a virtual visit for today

## 2018-11-27 ENCOUNTER — Ambulatory Visit
Admission: RE | Admit: 2018-11-27 | Discharge: 2018-11-27 | Disposition: A | Discharge status: Home / Self Care | Payer: Managed Care, Other (non HMO) | Source: Ambulatory Visit | Attending: Surgery | Admitting: Surgery

## 2018-11-27 ENCOUNTER — Ambulatory Visit (HOSPITAL_BASED_OUTPATIENT_CLINIC_OR_DEPARTMENT_OTHER): Payer: Managed Care, Other (non HMO) | Admitting: Anesthesiology

## 2018-11-27 ENCOUNTER — Encounter (HOSPITAL_BASED_OUTPATIENT_CLINIC_OR_DEPARTMENT_OTHER): Payer: Self-pay | Admitting: Anesthesiology

## 2018-11-27 ENCOUNTER — Other Ambulatory Visit: Payer: Self-pay

## 2018-11-27 ENCOUNTER — Encounter (HOSPITAL_BASED_OUTPATIENT_CLINIC_OR_DEPARTMENT_OTHER)
Admission: RE | Disposition: A | Discharge status: Home / Self Care | Payer: Self-pay | Source: Home / Self Care | Attending: Surgery

## 2018-11-27 ENCOUNTER — Other Ambulatory Visit: Payer: Self-pay | Admitting: Surgery

## 2018-11-27 ENCOUNTER — Ambulatory Visit (HOSPITAL_BASED_OUTPATIENT_CLINIC_OR_DEPARTMENT_OTHER)
Admission: RE | Admit: 2018-11-27 | Discharge: 2018-11-27 | Disposition: A | Discharge status: Home / Self Care | Payer: Managed Care, Other (non HMO) | Attending: Surgery | Admitting: Surgery

## 2018-11-27 DIAGNOSIS — Z888 Allergy status to other drugs, medicaments and biological substances status: Secondary | ICD-10-CM | POA: Diagnosis not present

## 2018-11-27 DIAGNOSIS — Z8042 Family history of malignant neoplasm of prostate: Secondary | ICD-10-CM | POA: Insufficient documentation

## 2018-11-27 DIAGNOSIS — D242 Benign neoplasm of left breast: Secondary | ICD-10-CM

## 2018-11-27 DIAGNOSIS — Z8249 Family history of ischemic heart disease and other diseases of the circulatory system: Secondary | ICD-10-CM | POA: Diagnosis not present

## 2018-11-27 DIAGNOSIS — F329 Major depressive disorder, single episode, unspecified: Secondary | ICD-10-CM | POA: Insufficient documentation

## 2018-11-27 DIAGNOSIS — F419 Anxiety disorder, unspecified: Secondary | ICD-10-CM | POA: Insufficient documentation

## 2018-11-27 HISTORY — PX: BREAST LUMPECTOMY WITH RADIOACTIVE SEED LOCALIZATION: SHX6424

## 2018-11-27 HISTORY — DX: Sleep apnea, unspecified: G47.30

## 2018-11-27 SURGERY — BREAST LUMPECTOMY WITH RADIOACTIVE SEED LOCALIZATION
Anesthesia: General | Site: Breast | Laterality: Left

## 2018-11-27 MED ORDER — MEPERIDINE HCL 25 MG/ML IJ SOLN: 6.2500 mg | INTRAMUSCULAR | Status: DC | PRN

## 2018-11-27 MED ORDER — OXYCODONE HCL 5 MG/5ML PO SOLN: 5.0000 mg | Freq: Once | ORAL | Status: DC | PRN

## 2018-11-27 MED ORDER — PROPOFOL 10 MG/ML IV BOLUS
INTRAVENOUS | Status: DC | PRN
  Administered 2018-11-27: 200 mg via INTRAVENOUS

## 2018-11-27 MED ORDER — BUPIVACAINE-EPINEPHRINE (PF) 0.5% -1:200000 IJ SOLN
INTRAMUSCULAR | Status: DC | PRN
  Administered 2018-11-27: 17 mL

## 2018-11-27 MED ORDER — SCOPOLAMINE 1 MG/3DAYS TD PT72
MEDICATED_PATCH | TRANSDERMAL | Status: AC
  Filled 2018-11-27: qty 1

## 2018-11-27 MED ORDER — GABAPENTIN 300 MG PO CAPS
300.0000 mg | ORAL_CAPSULE | ORAL | Status: AC
  Administered 2018-11-27: 300 mg via ORAL

## 2018-11-27 MED ORDER — MIDAZOLAM HCL 2 MG/2ML IJ SOLN: 1.0000 mg | INTRAMUSCULAR | Status: DC | PRN

## 2018-11-27 MED ORDER — LIDOCAINE 2% (20 MG/ML) 5 ML SYRINGE
INTRAMUSCULAR | Status: AC
  Filled 2018-11-27: qty 5

## 2018-11-27 MED ORDER — ONDANSETRON HCL 4 MG/2ML IJ SOLN
INTRAMUSCULAR | Status: AC
  Filled 2018-11-27: qty 2

## 2018-11-27 MED ORDER — CELECOXIB 200 MG PO CAPS
ORAL_CAPSULE | ORAL | Status: AC
  Filled 2018-11-27: qty 1

## 2018-11-27 MED ORDER — DEXAMETHASONE SODIUM PHOSPHATE 10 MG/ML IJ SOLN
INTRAMUSCULAR | Status: AC
  Filled 2018-11-27: qty 1

## 2018-11-27 MED ORDER — CEFAZOLIN SODIUM-DEXTROSE 2-4 GM/100ML-% IV SOLN
INTRAVENOUS | Status: AC
  Filled 2018-11-27: qty 100

## 2018-11-27 MED ORDER — CHLORHEXIDINE GLUCONATE CLOTH 2 % EX PADS: 6.0000 | MEDICATED_PAD | Freq: Once | CUTANEOUS | Status: DC

## 2018-11-27 MED ORDER — PROPOFOL 10 MG/ML IV BOLUS
INTRAVENOUS | Status: AC
  Filled 2018-11-27: qty 20

## 2018-11-27 MED ORDER — ONDANSETRON HCL 4 MG/2ML IJ SOLN
INTRAMUSCULAR | Status: DC | PRN
  Administered 2018-11-27: 4 mg via INTRAVENOUS

## 2018-11-27 MED ORDER — LIDOCAINE HCL (CARDIAC) PF 100 MG/5ML IV SOSY
PREFILLED_SYRINGE | INTRAVENOUS | Status: DC | PRN
  Administered 2018-11-27: 100 mg via INTRAVENOUS

## 2018-11-27 MED ORDER — DEXAMETHASONE SODIUM PHOSPHATE 10 MG/ML IJ SOLN
INTRAMUSCULAR | Status: DC | PRN
  Administered 2018-11-27: 10 mg via INTRAVENOUS

## 2018-11-27 MED ORDER — OXYCODONE HCL 5 MG PO TABS: 5.0000 mg | ORAL_TABLET | Freq: Once | ORAL | Status: DC | PRN

## 2018-11-27 MED ORDER — EPHEDRINE SULFATE 50 MG/ML IJ SOLN
INTRAMUSCULAR | Status: DC | PRN
  Administered 2018-11-27: 15 mg via INTRAVENOUS
  Administered 2018-11-27: 10 mg via INTRAVENOUS

## 2018-11-27 MED ORDER — CELECOXIB 200 MG PO CAPS
200.0000 mg | ORAL_CAPSULE | ORAL | Status: AC
  Administered 2018-11-27: 200 mg via ORAL

## 2018-11-27 MED ORDER — IBUPROFEN 800 MG PO TABS: 800.0000 mg | ORAL_TABLET | Freq: Three times a day (TID) | ORAL | 0 refills | Status: DC | PRN

## 2018-11-27 MED ORDER — LACTATED RINGERS IV SOLN
INTRAVENOUS | Status: DC
  Administered 2018-11-27 (×2): via INTRAVENOUS

## 2018-11-27 MED ORDER — CEFAZOLIN SODIUM-DEXTROSE 2-4 GM/100ML-% IV SOLN: 2.0000 g | INTRAVENOUS | Status: DC

## 2018-11-27 MED ORDER — GABAPENTIN 300 MG PO CAPS
ORAL_CAPSULE | ORAL | Status: AC
  Filled 2018-11-27: qty 1

## 2018-11-27 MED ORDER — FENTANYL CITRATE (PF) 100 MCG/2ML IJ SOLN: 25.0000 ug | INTRAMUSCULAR | Status: DC | PRN

## 2018-11-27 MED ORDER — SCOPOLAMINE 1 MG/3DAYS TD PT72
1.0000 | MEDICATED_PATCH | Freq: Once | TRANSDERMAL | Status: DC
  Administered 2018-11-27: 1.5 mg via TRANSDERMAL

## 2018-11-27 MED ORDER — CEFAZOLIN SODIUM-DEXTROSE 2-3 GM-%(50ML) IV SOLR
INTRAVENOUS | Status: DC | PRN
  Administered 2018-11-27: 2 g via INTRAVENOUS

## 2018-11-27 MED ORDER — ONDANSETRON HCL 4 MG/2ML IJ SOLN: 4.0000 mg | Freq: Once | INTRAMUSCULAR | Status: DC | PRN

## 2018-11-27 MED ORDER — FENTANYL CITRATE (PF) 100 MCG/2ML IJ SOLN
50.0000 ug | INTRAMUSCULAR | Status: DC | PRN
  Administered 2018-11-27: 100 ug via INTRAVENOUS

## 2018-11-27 MED ORDER — ACETAMINOPHEN 500 MG PO TABS
1000.0000 mg | ORAL_TABLET | ORAL | Status: AC
  Administered 2018-11-27: 1000 mg via ORAL

## 2018-11-27 MED ORDER — ACETAMINOPHEN 500 MG PO TABS
ORAL_TABLET | ORAL | Status: AC
  Filled 2018-11-27: qty 2

## 2018-11-27 MED ORDER — HYDROCODONE-ACETAMINOPHEN 5-325 MG PO TABS: 1.0000 | ORAL_TABLET | Freq: Four times a day (QID) | ORAL | 0 refills | Status: DC | PRN

## 2018-11-27 MED ORDER — FENTANYL CITRATE (PF) 100 MCG/2ML IJ SOLN
INTRAMUSCULAR | Status: AC
  Filled 2018-11-27: qty 2

## 2018-11-27 SURGICAL SUPPLY — 47 items
APPLIER CLIP 9.375 MED OPEN (MISCELLANEOUS)
BINDER BREAST LRG (GAUZE/BANDAGES/DRESSINGS) IMPLANT
BINDER BREAST MEDIUM (GAUZE/BANDAGES/DRESSINGS) IMPLANT
BINDER BREAST XLRG (GAUZE/BANDAGES/DRESSINGS) IMPLANT
BINDER BREAST XXLRG (GAUZE/BANDAGES/DRESSINGS) IMPLANT
BLADE SURG 15 STRL LF DISP TIS (BLADE) ×1 IMPLANT
BLADE SURG 15 STRL SS (BLADE) ×2
CANISTER SUC SOCK COL 7IN (MISCELLANEOUS) IMPLANT
CANISTER SUCT 1200ML W/VALVE (MISCELLANEOUS) IMPLANT
CHLORAPREP W/TINT 26 (MISCELLANEOUS) ×3 IMPLANT
CLIP APPLIE 9.375 MED OPEN (MISCELLANEOUS) IMPLANT
COVER BACK TABLE REUSABLE LG (DRAPES) ×3 IMPLANT
COVER MAYO STAND REUSABLE (DRAPES) ×3 IMPLANT
COVER PROBE W GEL 5X96 (DRAPES) ×3 IMPLANT
COVER WAND RF STERILE (DRAPES) IMPLANT
DECANTER SPIKE VIAL GLASS SM (MISCELLANEOUS) IMPLANT
DERMABOND ADVANCED (GAUZE/BANDAGES/DRESSINGS) ×2
DERMABOND ADVANCED .7 DNX12 (GAUZE/BANDAGES/DRESSINGS) ×1 IMPLANT
DRAPE LAPAROSCOPIC ABDOMINAL (DRAPES) IMPLANT
DRAPE LAPAROTOMY 100X72 PEDS (DRAPES) ×3 IMPLANT
DRAPE UTILITY XL STRL (DRAPES) ×3 IMPLANT
ELECT COATED BLADE 2.86 ST (ELECTRODE) ×3 IMPLANT
ELECT REM PT RETURN 9FT ADLT (ELECTROSURGICAL) ×3
ELECTRODE REM PT RTRN 9FT ADLT (ELECTROSURGICAL) ×1 IMPLANT
GLOVE BIOGEL PI IND STRL 8 (GLOVE) ×1 IMPLANT
GLOVE BIOGEL PI INDICATOR 8 (GLOVE) ×2
GLOVE ECLIPSE 8.0 STRL XLNG CF (GLOVE) ×3 IMPLANT
GOWN STRL REUS W/ TWL LRG LVL3 (GOWN DISPOSABLE) ×2 IMPLANT
GOWN STRL REUS W/TWL LRG LVL3 (GOWN DISPOSABLE) ×4
HEMOSTAT ARISTA ABSORB 3G PWDR (HEMOSTASIS) IMPLANT
HEMOSTAT SNOW SURGICEL 2X4 (HEMOSTASIS) IMPLANT
KIT MARKER MARGIN INK (KITS) ×3 IMPLANT
NEEDLE HYPO 25X1 1.5 SAFETY (NEEDLE) ×3 IMPLANT
NS IRRIG 1000ML POUR BTL (IV SOLUTION) ×3 IMPLANT
PACK BASIN DAY SURGERY FS (CUSTOM PROCEDURE TRAY) ×3 IMPLANT
PENCIL BUTTON HOLSTER BLD 10FT (ELECTRODE) ×3 IMPLANT
SLEEVE SCD COMPRESS KNEE MED (MISCELLANEOUS) ×3 IMPLANT
SPONGE LAP 4X18 RFD (DISPOSABLE) ×3 IMPLANT
SUT MNCRL AB 4-0 PS2 18 (SUTURE) ×3 IMPLANT
SUT SILK 2 0 SH (SUTURE) IMPLANT
SUT VICRYL 3-0 CR8 SH (SUTURE) ×3 IMPLANT
SYR CONTROL 10ML LL (SYRINGE) ×3 IMPLANT
TOWEL GREEN STERILE FF (TOWEL DISPOSABLE) ×3 IMPLANT
TRAY FAXITRON CT DISP (TRAY / TRAY PROCEDURE) ×3 IMPLANT
TUBE CONNECTING 20'X1/4 (TUBING)
TUBE CONNECTING 20X1/4 (TUBING) IMPLANT
YANKAUER SUCT BULB TIP NO VENT (SUCTIONS) IMPLANT

## 2018-11-27 NOTE — Op Note (Signed)
Preoperative diagnosis: Left breast fibroadenoma  Postoperative diagnosis: Same  Procedure: Left breast seed localized lumpectomy  Surgeon: Erroll Luna, MD  Anesthesia: LMA with 0.25% Sensorcaine local with epinephrine  EBL: 10 cc  Specimen: Left breast tissue containing fibroadenoma with seed and clip verified by Faxitron  Drains: None  Indications for procedure: The patient is a 48 year old female with a history of a left breast fibroadenoma by core biopsy many years ago.  It is been changing in size and getting larger.  She opted for removal given the change in size of this mass and discomfort.The procedure has been discussed with the patient. Alternatives to surgery have been discussed with the patient.  Risks of surgery include bleeding,  Infection,  Seroma formation, death,  and the need for further surgery.   The patient understands and wishes to proceed.  Description of procedure: The patient was met in the holding area.  The left breast was marked as the correct side and neoprobe was used to verify seed location.  Questions were answered.  She was taken back the operative room placed supine upon the operating table.  After induction of LMA anesthesia, left breast was prepped and draped in sterile fashion timeout was performed.  She was received appropriate preoperative antibiotics.  Neoprobe used to identify seed location which was just medial to the nipple areolar complex.  Curvilinear incision was made on the medial border of the nipple were complex.  Dissection was carried down with the help the neoprobe around the mass which contained the seed and clip.  The entire mass was excised with grossly negative margins.  Faxitron image revealed the seed clip to be present.  Of note the specimen was oriented with ink prior to passing off the field.  The cavity was made hemostatic with cautery.  The wound was closed with 3-0 Vicryl for Monocryl.  Dermabond applied.  All final counts found to  be correct of sponge, needle and instruments.  Breast binder placed.  Patient was awoke extubated taken recovery in satisfactory condition.

## 2018-11-27 NOTE — Discharge Instructions (Signed)
[place] Office Phone Number [phone]  BREAST BIOPSY/ PARTIAL MASTECTOMY: POST OP INSTRUCTIONS  Always review your discharge instruction sheet given to you by the facility where your surgery was performed.  IF YOU HAVE DISABILITY OR FAMILY LEAVE FORMS, YOU MUST BRING THEM TO THE OFFICE FOR PROCESSING.  DO NOT GIVE THEM TO YOUR DOCTOR.  1. A prescription for pain medication may be given to you upon discharge.  Take your pain medication as prescribed, if needed.  If narcotic pain medicine is not needed, then you may take acetaminophen (Tylenol) or ibuprofen (Advil) as needed. No Tylenol until 6:30pm, no ibuprofen until 8:30pm 2. Take your usually prescribed medications unless otherwise directed 3. If you need a refill on your pain medication, please contact your pharmacy.  They will contact our office to request authorization.  Prescriptions will not be filled after 5pm or on week-ends. 4. You should eat very light the first 24 hours after surgery, such as soup, crackers, pudding, etc.  Resume your normal diet the day after surgery. 5. Most patients will experience some swelling and bruising in the breast.  Ice packs and a good support bra will help.  Swelling and bruising can take several days to resolve.  6. It is common to experience some constipation if taking pain medication after surgery.  Increasing fluid intake and taking a stool softener will usually help or prevent this problem from occurring.  A mild laxative (Milk of Magnesia or Miralax) should be taken according to package directions if there are no bowel movements after 48 hours. 7. Unless discharge instructions indicate otherwise, you may remove your bandages 24-48 hours after surgery, and you may shower at that time.  You may have steri-strips (small skin tapes) in place directly over the incision.  These strips should be left on the skin for 7-10 days.  If your surgeon used skin glue on the incision, you may  shower in 24 hours.  The glue will flake off over the next 2-3 weeks.  Any sutures or staples will be removed at the office during your follow-up visit. 8. ACTIVITIES:  You may resume regular daily activities (gradually increasing) beginning the next day.  Wearing a good support bra or sports bra minimizes pain and swelling.  You may have sexual intercourse when it is comfortable. a. You may drive when you no longer are taking prescription pain medication, you can comfortably wear a seatbelt, and you can safely maneuver your car and apply brakes. b. RETURN TO WORK:  ______________________________________________________________________________________ 9. You should see your doctor in the office for a follow-up appointment approximately two weeks after your surgery.  Your doctors nurse will typically make your follow-up appointment when she calls you with your pathology report.  Expect your pathology report 2-3 business days after your surgery.  You may call to check if you do not hear from [place] after three days. 10. OTHER INSTRUCTIONS: _______________________________________________________________________________________________ _____________________________________________________________________________________________________________________________________ _____________________________________________________________________________________________________________________________________ _____________________________________________________________________________________________________________________________________  WHEN TO CALL YOUR DOCTOR: 1. Fever over 101.0 2. Nausea and/or vomiting. 3. Extreme swelling or bruising. 4. Continued bleeding from incision. 5. Increased pain, redness, or drainage from the incision.  The clinic staff is available to answer your questions during regular business hours.  Please dont hesitate to call and ask to speak to one of the nurses for clinical concerns.   If you have a medical emergency, go to the nearest emergency room or call 911.  A surgeon from [facility] is always on call at the hospital.  For further questions, please visit [email or web address]     Post Anesthesia Home Care Instructions  Activity: Get plenty of rest for the remainder of the day. A responsible individual must stay with you for 24 hours following the procedure.  For the next 24 hours, DO NOT: -Drive a car -Paediatric nurse -Drink alcoholic beverages -Take any medication unless instructed by your physician -Make any legal decisions or sign important papers.  Meals: Start with liquid foods such as gelatin or soup. Progress to regular foods as tolerated. Avoid greasy, spicy, heavy foods. If nausea and/or vomiting occur, drink only clear liquids until the nausea and/or vomiting subsides. Call your physician if vomiting continues.  Special Instructions/Symptoms: Your throat may feel dry or sore from the anesthesia or the breathing tube placed in your throat during surgery. If this causes discomfort, gargle with warm salt water. The discomfort should disappear within 24 hours.  If you had a scopolamine patch placed behind your ear for the management of post- operative nausea and/or vomiting:  1. The medication in the patch is effective for 72 hours, after which it should be removed.  Wrap patch in a tissue and discard in the trash. Wash hands thoroughly with soap and water. 2. You may remove the patch earlier than 72 hours if you experience unpleasant side effects which may include dry mouth, dizziness or visual disturbances. 3. Avoid touching the patch. Wash your hands with soap and water after contact with the patch.

## 2018-11-27 NOTE — Anesthesia Postprocedure Evaluation (Signed)
Anesthesia Post Note  Patient: Carrey Vittitoe  Procedure(s) Performed: RADIOACTIVE SEED GUIDED LEFT BREAST LUMPECTOMY (Left Breast)     Patient location during evaluation: PACU Anesthesia Type: General Level of consciousness: awake and alert and oriented Pain management: pain level controlled Vital Signs Assessment: post-procedure vital signs reviewed and stable Respiratory status: spontaneous breathing, nonlabored ventilation and respiratory function stable Cardiovascular status: blood pressure returned to baseline and stable Postop Assessment: no apparent nausea or vomiting Anesthetic complications: no    Last Vitals:  Vitals:   11/27/18 1600 11/27/18 1604  BP: (!) 106/46 119/74  Pulse: 81 84  Resp: (!) 21 19  Temp:    SpO2: 94% 97%    Last Pain:  Vitals:   11/27/18 1600  TempSrc:   PainSc: 2                  Leonidas Boateng A.

## 2018-11-27 NOTE — Transfer of Care (Signed)
Immediate Anesthesia Transfer of Care Note  Patient: Wanda Williams  Procedure(s) Performed: RADIOACTIVE SEED GUIDED LEFT BREAST LUMPECTOMY (Left Breast)  Patient Location: PACU  Anesthesia Type:General  Level of Consciousness: awake, alert  and oriented  Airway & Oxygen Therapy: Patient Spontanous Breathing and Patient connected to face mask oxygen  Post-op Assessment: Report given to RN and Post -op Vital signs reviewed and stable  Post vital signs: Reviewed and stable  Last Vitals:  Vitals Value Taken Time  BP    Temp    Pulse 80 11/27/18 1531  Resp    SpO2 100 % 11/27/18 1531  Vitals shown include unvalidated device data.  Last Pain:  Vitals:   11/27/18 1203  TempSrc: Oral  PainSc: 2       Patients Stated Pain Goal: 3 ([id number])  Complications: No apparent anesthesia complications

## 2018-11-27 NOTE — Anesthesia Procedure Notes (Signed)
Procedure Name: LMA Insertion Performed by: Verita Lamb, CRNA Pre-anesthesia Checklist: Patient identified, Emergency Drugs available, Suction available, Patient being monitored and Timeout performed Patient Re-evaluated:Patient Re-evaluated prior to induction Oxygen Delivery Method: Circle system utilized Preoxygenation: Pre-oxygenation with 100% oxygen Induction Type: IV induction LMA: LMA inserted LMA Size: 4.0 Tube type: Oral Number of attempts: 1 Placement Confirmation: positive ETCO2,  breath sounds checked- equal and bilateral and CO2 detector Tube secured with: Tape Dental Injury: Teeth and Oropharynx as per pre-operative assessment

## 2018-11-27 NOTE — Anesthesia Preprocedure Evaluation (Signed)
Anesthesia Evaluation  Patient identified by MRN, date of birth, ID band Patient awake    Reviewed: Allergy & Precautions, NPO status , Patient's Chart, lab work & pertinent test results  Airway Mallampati: III  TM Distance: >3 FB Neck ROM: Full    Dental no notable dental hx. (+) Teeth Intact   Pulmonary sleep apnea and Continuous Positive Airway Pressure Ventilation ,    Pulmonary exam normal breath sounds clear to auscultation       Cardiovascular hypertension, Pt. on medications Normal cardiovascular exam Rhythm:Regular Rate:Normal     Neuro/Psych PSYCHIATRIC DISORDERS Anxiety Depression  Neuromuscular disease    GI/Hepatic negative GI ROS, Neg liver ROS,   Endo/Other  Morbid obesityLeft breast fibroadenoma  Renal/GU negative Renal ROS  negative genitourinary   Musculoskeletal  (+) Arthritis , Osteoarthritis,    Abdominal (+) + obese,   Peds  Hematology Hx/o angioedema   Anesthesia Other Findings   Reproductive/Obstetrics                             Anesthesia Physical Anesthesia Plan  ASA: III  Anesthesia Plan:    Post-op Pain Management:    Induction:   PONV Risk Score and Plan: 3 and Midazolam, Ondansetron, Dexamethasone and Treatment may vary due to age or medical condition  Airway Management Planned: LMA  Additional Equipment:   Intra-op Plan:   Post-operative Plan: Extubation in OR  Informed Consent: I have reviewed the patients History and Physical, chart, labs and discussed the procedure including the risks, benefits and alternatives for the proposed anesthesia with the patient or authorized representative who has indicated his/her understanding and acceptance.     Dental advisory given  Plan Discussed with: CRNA and Surgeon  Anesthesia Plan Comments:         Anesthesia Quick Evaluation

## 2018-11-27 NOTE — Interval H&P Note (Signed)
History and Physical Interval Note:  11/27/2018 2:35 PM  Wanda Williams  has presented today for surgery, with the diagnosis of LEFT BREAST FIBROADENOMA.  The various methods of treatment have been discussed with the patient and family. After consideration of risks, benefits and other options for treatment, the patient has consented to  Procedure(s): LEFT BREAST LUMPECTOMY WITH RADIOACTIVE SEED LOCALIZATION (Left) as a surgical intervention.  The patient's history has been reviewed, patient examined, no change in status, stable for surgery.  I have reviewed the patient's chart and labs.  Questions were answered to the patient's satisfaction.     [place]

## 2018-11-28 ENCOUNTER — Encounter (HOSPITAL_BASED_OUTPATIENT_CLINIC_OR_DEPARTMENT_OTHER): Payer: Self-pay | Admitting: Surgery

## 2018-11-30 ENCOUNTER — Encounter: Payer: Self-pay | Admitting: Gastroenterology

## 2018-11-30 LAB — SURGICAL PATHOLOGY

## 2018-12-22 ENCOUNTER — Other Ambulatory Visit: Payer: Self-pay | Admitting: Family

## 2018-12-31 ENCOUNTER — Encounter: Payer: Self-pay | Admitting: Gastroenterology

## 2018-12-31 ENCOUNTER — Other Ambulatory Visit: Payer: Self-pay

## 2018-12-31 ENCOUNTER — Ambulatory Visit (AMBULATORY_SURGERY_CENTER): Payer: Self-pay | Admitting: *Deleted

## 2018-12-31 VITALS — Temp 97.1°F | Ht <= 58 in | Wt 200.4 lb

## 2018-12-31 DIAGNOSIS — Z1211 Encounter for screening for malignant neoplasm of colon: Secondary | ICD-10-CM

## 2018-12-31 DIAGNOSIS — Z1159 Encounter for screening for other viral diseases: Secondary | ICD-10-CM

## 2018-12-31 MED ORDER — SUPREP BOWEL PREP KIT 17.5-3.13-1.6 GM/177ML PO SOLN: 1.0000 | Freq: Once | ORAL | 0 refills | Status: AC

## 2018-12-31 NOTE — Addendum Note (Signed)
Addended by: Marga Melnick C on: 12/31/2018 11:40 AM   Modules accepted: Orders

## 2018-12-31 NOTE — Progress Notes (Signed)
Patient denies any allergies to egg or soy products. Patient denies complications with anesthesia/sedation.  Patient denies oxygen use at home and denies diet medications. OSA, uses CPAP nightly.  Emmi instructions for colonoscopy/endoscopy explained and given to patient.  Suprep coupon given.

## 2019-01-09 ENCOUNTER — Other Ambulatory Visit: Payer: Self-pay | Admitting: Gastroenterology

## 2019-01-10 LAB — SARS CORONAVIRUS 2 (TAT 6-24 HRS): SARS Coronavirus 2: NEGATIVE

## 2019-01-14 ENCOUNTER — Ambulatory Visit (AMBULATORY_SURGERY_CENTER): Payer: Managed Care, Other (non HMO) | Admitting: Gastroenterology

## 2019-01-14 ENCOUNTER — Encounter: Payer: Self-pay | Admitting: Gastroenterology

## 2019-01-14 ENCOUNTER — Other Ambulatory Visit: Payer: Self-pay

## 2019-01-14 VITALS — BP 123/74 | HR 72 | Temp 98.0°F | Resp 16 | Ht <= 58 in | Wt 200.0 lb

## 2019-01-14 DIAGNOSIS — Z1211 Encounter for screening for malignant neoplasm of colon: Secondary | ICD-10-CM

## 2019-01-14 MED ORDER — SODIUM CHLORIDE 0.9 % IV SOLN: 500.0000 mL | Freq: Once | INTRAVENOUS | Status: DC

## 2019-01-14 NOTE — Progress Notes (Signed)
Temp check by:JB Vital check by:  The patient states no changes in medical or surgical history since pre-visit screening on 12/31/2018.

## 2019-01-14 NOTE — Patient Instructions (Signed)
HANDOUTS INCLUDED: POLYPS AND DIVERTICULOSIS   YOU HAD AN ENDOSCOPIC PROCEDURE TODAY AT [facility]:   Refer to the procedure report that was given to you for any specific questions about what was found during the examination.  If the procedure report does not answer your questions, please call your gastroenterologist to clarify.  If you requested that your care partner not be given the details of your procedure findings, then the procedure report has been included in a sealed envelope for you to review at your convenience later.  YOU SHOULD EXPECT: Some feelings of bloating in the abdomen. Passage of more gas than usual.  Walking can help get rid of the air that was put into your GI tract during the procedure and reduce the bloating. If you had a lower endoscopy (such as a colonoscopy or flexible sigmoidoscopy) you may notice spotting of blood in your stool or on the toilet paper. If you underwent a bowel prep for your procedure, you may not have a normal bowel movement for a few days.  Please Note:  You might notice some irritation and congestion in your nose or some drainage.  This is from the oxygen used during your procedure.  There is no need for concern and it should clear up in a day or so.  SYMPTOMS TO REPORT IMMEDIATELY:   Following lower endoscopy (colonoscopy or flexible sigmoidoscopy):  Excessive amounts of blood in the stool  Significant tenderness or worsening of abdominal pains  Swelling of the abdomen that is new, acute  Fever of 100F or higher  For urgent or emergent issues, a gastroenterologist can be reached at any hour by calling [phone].   DIET:  We do recommend a small meal at first, but then you may proceed to your regular diet.  Drink plenty of fluids but you should avoid alcoholic beverages for 24 hours.  ACTIVITY:  You should plan to take it easy for the rest of today and you should NOT DRIVE or use heavy machinery until tomorrow (because of  the sedation medicines used during the test).    FOLLOW UP: Our staff will call the number listed on your records 48-72 hours following your procedure to check on you and address any questions or concerns that you may have regarding the information given to you following your procedure. If we do not reach you, we will leave a message.  We will attempt to reach you two times.  During this call, we will ask if you have developed any symptoms of COVID 19. If you develop any symptoms (ie: fever, flu-like symptoms, shortness of breath, cough etc.) before then, please call [phone].  If you test positive for Covid 19 in the 2 weeks post procedure, please call and report this information to [place].    If any biopsies were taken you will be contacted by phone or by letter within the next 1-3 weeks.  Please call us at [phone] if you have not heard about the biopsies in 3 weeks.    SIGNATURES/CONFIDENTIALITY: You and/or your care partner have signed paperwork which will be entered into your electronic medical record.  These signatures attest to the fact that that the information above on your After Visit Summary has been reviewed and is understood.  Full responsibility of the confidentiality of this discharge information lies with you and/or your care-partner.

## 2019-01-14 NOTE — Progress Notes (Signed)
To PACU< VSS. Report to [email or web address] 

## 2019-01-14 NOTE — Op Note (Signed)
[facility] Patient Name: Wanda Williams Procedure Date: 01/14/2019 8:24 AM MRN: [id number] Endoscopist: Jackquline Denmark , MD Age: 48 Referring MD:  Date of Birth: 1970-04-18 Gender: Female Account #: [id number] Procedure:                Colonoscopy Indications:              Screening for colorectal malignant neoplasm Medicines:                Monitored Anesthesia Care Procedure:                Pre-Anesthesia Assessment:                           - Prior to the procedure, a History and Physical                            was performed, and patient medications and                            allergies were reviewed. The patient's tolerance of                            previous anesthesia was also reviewed. The risks                            and benefits of the procedure and the sedation                            options and risks were discussed with the patient.                            All questions were answered, and informed consent                            was obtained. Prior Anticoagulants: The patient has                            taken no previous anticoagulant or antiplatelet                            agents. ASA Grade Assessment: II - A patient with                            mild systemic disease. After reviewing the risks                            and benefits, the patient was deemed in                            satisfactory condition to undergo the procedure.                           After obtaining informed consent, the colonoscope  was passed under direct vision. Throughout the                            procedure, the patient's blood pressure, pulse, and                            oxygen saturations were monitored continuously. The                            Colonoscope was introduced through the anus and                            advanced to the 2 cm into the ileum. The                            colonoscopy was performed  without difficulty. The                            patient tolerated the procedure well. The quality                            of the bowel preparation was good. The terminal                            ileum, ileocecal valve, appendiceal orifice, and                            rectum were photographed. Scope In: 8:32:41 AM Scope Out: 8:43:40 AM Scope Withdrawal Time: 0 hours 6 minutes 56 seconds  Total Procedure Duration: 0 hours 10 minutes 59 seconds  Findings:                 A few small-mouthed diverticula were found in the                            sigmoid colon and descending colon.                           Non-bleeding internal hemorrhoids were found during                            retroflexion. The hemorrhoids were small. The colon                            was somewhat redundant.                           The terminal ileum appeared normal.                           The exam was otherwise without abnormality on                            direct and retroflexion views. Complications:            No immediate  complications. Estimated Blood Loss:     Estimated blood loss: none. Impression:               -Mild left colonic diverticulosis.                           -Minimal internal hemorrhoids.                           -Otherwise normal colonoscopy to TI. The colon was                            somewhat redundant. Recommendation:           - Patient has a contact number available for                            emergencies. The signs and symptoms of potential                            delayed complications were discussed with the                            patient. Return to normal activities tomorrow.                            Written discharge instructions were provided to the                            patient.                           - High fiber diet.                           - Continue present medications.                           - Repeat colonoscopy in 10 years  for screening                            purposes. Earlier, if with any new problems or if                            there is any change in family history.                           - Return to GI office PRN. D/W Sydell Axon. Jackquline Denmark, MD 01/14/2019 8:49:07 AM This report has been signed electronically.

## 2019-01-16 ENCOUNTER — Telehealth: Payer: Self-pay

## 2019-01-16 NOTE — Telephone Encounter (Signed)
1st follow up call made.  NALM 

## 2019-01-16 NOTE — Telephone Encounter (Signed)
Left message on f/u call 

## 2019-03-23 ENCOUNTER — Other Ambulatory Visit: Payer: Self-pay | Admitting: Family

## 2019-04-05 ENCOUNTER — Ambulatory Visit (INDEPENDENT_AMBULATORY_CARE_PROVIDER_SITE_OTHER): Payer: Managed Care, Other (non HMO) | Admitting: Family

## 2019-04-05 ENCOUNTER — Encounter: Payer: Self-pay | Admitting: Family

## 2019-04-05 ENCOUNTER — Other Ambulatory Visit: Payer: Self-pay

## 2019-04-05 ENCOUNTER — Other Ambulatory Visit (HOSPITAL_COMMUNITY)
Admission: RE | Admit: 2019-04-05 | Discharge: 2019-04-05 | Disposition: A | Discharge status: Home / Self Care | Payer: Managed Care, Other (non HMO) | Source: Ambulatory Visit | Attending: Family | Admitting: Family

## 2019-04-05 VITALS — BP 134/80 | HR 81 | Temp 97.9°F | Resp 16 | Ht <= 58 in | Wt 198.0 lb

## 2019-04-05 DIAGNOSIS — M545 Low back pain, unspecified: Secondary | ICD-10-CM

## 2019-04-05 DIAGNOSIS — F418 Other specified anxiety disorders: Secondary | ICD-10-CM

## 2019-04-05 DIAGNOSIS — N898 Other specified noninflammatory disorders of vagina: Secondary | ICD-10-CM | POA: Diagnosis present

## 2019-04-05 DIAGNOSIS — R21 Rash and other nonspecific skin eruption: Secondary | ICD-10-CM

## 2019-04-05 DIAGNOSIS — I10 Essential (primary) hypertension: Secondary | ICD-10-CM | POA: Diagnosis not present

## 2019-04-05 MED ORDER — PREDNISONE 10 MG PO TABS: ORAL_TABLET | ORAL | 0 refills | Status: DC

## 2019-04-05 MED ORDER — VENLAFAXINE HCL ER 150 MG PO CP24: 150.0000 mg | ORAL_CAPSULE | Freq: Every day | ORAL | 1 refills | Status: DC

## 2019-04-05 MED ORDER — METRONIDAZOLE 0.75 % VA GEL: 1.0000 | Freq: Every day | VAGINAL | 0 refills | Status: AC

## 2019-04-05 NOTE — Progress Notes (Signed)
Subjective:    Patient ID: Wanda Williams, female    DOB: [birth year], 49 y.o.   MRN: [id number]  HPI   Patient is 49 yr old female who presents today with 2 concerns.  Depression- reports depression symptoms have recently worsened. Reports that she is constantly crying. Has no reason to cry.  Reports that her oldest daughter is pregnant for the 3rd time.  Not sure who the father is. She feels upset that she has a daughter in this situation.  She is currently on effexor xr 75mg .  She reports that her anxiety has worsened recently as well.  Skin rash- bilateral legs x 2 weeks. Started 2 weeks ago.  Only change was addition of Cayenne pepper tablets.   HTN-she is maintained on Diovan HCT. BP Readings from Last 3 Encounters:  04/05/19 134/80  01/14/19 123/74  11/27/18 135/76   She also reports vaginal odor and discharge.  Odor was noted following intercourse a week ago.  She has previous history of BV.  Review of Systems See HPI  Past Medical History:  Diagnosis Date  . Allergy   . Angio-edema   . Anxiety   . Depression   . Eczema   . HTN (hypertension)   . Sleep apnea    uses CPAP nightly     Social History   Socioeconomic History  . Marital status: Married    Spouse name: Not on file  . Number of children: Not on file  . Years of education: Not on file  . Highest education level: Not on file  Occupational History  . Not on file  Tobacco Use  . Smoking status: Never Smoker  . Smokeless tobacco: Never Used  Substance and Sexual Activity  . Alcohol use: Yes    Alcohol/week: 0.0 standard drinks    Comment: occasional wine  . Drug use: No  . Sexual activity: Yes    Birth control/protection: Surgical, Post-menopausal    Comment: BTL  Other Topics Concern  . Not on file  Social History Narrative   Works at [employer]   One daughter age 69   One daughter is age 51   Enjoys cross stitching, playing games on her cell phone.     2 dogs   Completed 12th grade.     Social Determinants of Health   Financial Resource Strain:   . Difficulty of Paying Living Expenses: Not on file  Food Insecurity:   . Worried About Charity fundraiser in the Last Year: Not on file  . Ran Out of Food in the Last Year: Not on file  Transportation Needs:   . Lack of Transportation (Medical): Not on file  . Lack of Transportation (Non-Medical): Not on file  Physical Activity:   . Days of Exercise per Week: Not on file  . Minutes of Exercise per Session: Not on file  Stress:   . Feeling of Stress : Not on file  Social Connections:   . Frequency of Communication with Friends and Family: Not on file  . Frequency of Social Gatherings with Friends and Family: Not on file  . Attends Religious Services: Not on file  . Active Member of Clubs or Organizations: Not on file  . Attends Archivist Meetings: Not on file  . Marital Status: Not on file  Intimate Partner Violence:   . Fear of Current or Ex-Partner: Not on file  . Emotionally Abused: Not on file  . Physically Abused: Not on file  .  Sexually Abused: Not on file    Past Surgical History:  Procedure Laterality Date  . BREAST LUMPECTOMY WITH RADIOACTIVE SEED LOCALIZATION Left 11/27/2018   Procedure: RADIOACTIVE SEED GUIDED LEFT BREAST LUMPECTOMY;  Surgeon: Erroll Luna, MD;  Location: [place];  Service: General;  Laterality: Left;  . CESAREAN SECTION     x 2  . DILATION AND CURETTAGE OF UTERUS    . THYROID CYST EXCISION    . TUBAL LIGATION  2006    Family History  Problem Relation Age of Onset  . Heart disease Father        heart murmur  . Parkinson's disease Father   . Hypertension Mother   . Coronary artery disease Paternal Uncle        2 uncles (both died before 34)  . Coronary artery disease Paternal Aunt        MI, died before 7  . Melanoma Brother 42       melanoma  . Allergic rhinitis Sister   . Colon cancer Neg Hx   . Rectal cancer Neg Hx   . Stomach cancer  Neg Hx   . Esophageal cancer Neg Hx     Allergies  Allergen Reactions  . Ace Inhibitors Cough    Current Outpatient Medications on File Prior to Visit  Medication Sig Dispense Refill  . amLODipine (NORVASC) 2.5 MG tablet TAKE ONE TABLET BY MOUTH DAILY 30 tablet 2  . aspirin-acetaminophen-caffeine (EXCEDRIN MIGRAINE) 250-250-65 MG tablet Take 1-2 tablets by mouth every 6 (six) hours as needed for headache or migraine.     Marland Kitchen augmented betamethasone dipropionate (DIPROLENE-AF) 0.05 % cream Apply topically as needed.     . meloxicam (MOBIC) 7.5 MG tablet Take 7.5 mg by mouth once a day (Patient taking differently: at bedtime. Take 7.5 mg by mouth once a day) 30 tablet 3  . Multiple Vitamins-Minerals (MULTIVITAMIN WITH MINERALS) tablet Take 1 tablet by mouth at bedtime.     . triamcinolone (KENALOG) 0.025 % ointment Apply 1 application topically as needed.     . valsartan-hydrochlorothiazide (DIOVAN-HCT) 160-25 MG tablet TAKE ONE TABLET BY MOUTH DAILY (Patient taking differently: at bedtime. ) 30 tablet 5  . vitamin B-12 (CYANOCOBALAMIN) 100 MCG tablet Take 100 mcg by mouth at bedtime.      No current facility-administered medications on file prior to visit.    BP 134/80 (BP Location: Right Arm, Patient Position: Sitting, Cuff Size: Large)   Pulse 81   Temp 97.9 F (36.6 C) (Temporal)   Resp 16   Ht 4' 9.5" (1.461 m)   Wt 198 lb (89.8 kg)   LMP 04/20/2016 (LMP Unknown) Comment: BTL  SpO2 100%   BMI 42.10 kg/m       Objective:   Physical Exam Constitutional:      Appearance: Normal appearance.  HENT:     Head: Normocephalic and atraumatic.  Skin:    Comments: Scabbed, macular rash noted diffusely on bilateral lower extremities.  Neurological:     Mental Status: She is alert.  Psychiatric:        Mood and Affect: Mood is depressed.     Comments: Tearful during exam.           Assessment & Plan:  Depression/anxiety-deteriorated.  Will increase Effexor XR from 75 mg  to 150 mg once daily.  Skin rash-new.  Will give prednisone taper due to pruritus.  She is advised to stop the cayenne pepper supplement in case she is  allergic to this.  Hypertension-stable on current medications.  Continue same.  Vaginal odor-will send urine for ancillary testing for BV and yeast.  Plan empiric treatment with MetroGel.  Back pain-will obtain urine culture to rule out UTI.  This visit occurred during the SARS-CoV-2 public health emergency.  Safety protocols were in place, including screening questions prior to the visit, additional usage of staff PPE, and extensive cleaning of exam room while observing appropriate contact time as indicated for disinfecting solutions.

## 2019-04-05 NOTE — Patient Instructions (Signed)
Please increase effexor to 150mg  once daily. Start metrogel vaginal gel. Start prednisone taper for your rash. Call if symptoms worsen or if symptoms fail to improve.

## 2019-04-08 LAB — UNLABELED: Test Ordered On Req: 395

## 2019-04-09 ENCOUNTER — Encounter: Payer: Self-pay | Admitting: Family

## 2019-04-09 NOTE — Telephone Encounter (Signed)
Patient advised on the phone the request from the lab was taken care of this morning

## 2019-04-10 LAB — TEST AUTHORIZATION

## 2019-04-10 LAB — URINE CULTURE
MICRO NUMBER:: 10106556
SPECIMEN QUALITY:: ADEQUATE

## 2019-04-10 LAB — PAT ID TIQ DOC: Test Affected: 395

## 2019-04-10 MED ORDER — SULFAMETHOXAZOLE-TRIMETHOPRIM 400-80 MG PO TABS: 1.0000 | ORAL_TABLET | Freq: Two times a day (BID) | ORAL | 0 refills | Status: DC

## 2019-04-10 NOTE — Telephone Encounter (Signed)
Please confirm in AM that pt saw my message and call her if she has not seen it.

## 2019-04-11 LAB — URINE CYTOLOGY ANCILLARY ONLY
Bacterial Vaginitis-Urine: NEGATIVE
Candida Urine: NEGATIVE

## 2019-04-11 NOTE — Telephone Encounter (Signed)
Called patient at few times today but no answer.  Per pharmacy she has not picked up medication.

## 2019-04-12 NOTE — Telephone Encounter (Signed)
Patient reports she received the messages from yesterday and she will pick up antibiotic this morning.

## 2019-05-06 ENCOUNTER — Other Ambulatory Visit: Payer: Self-pay

## 2019-05-06 ENCOUNTER — Encounter: Payer: Self-pay | Admitting: Family

## 2019-05-06 ENCOUNTER — Ambulatory Visit (INDEPENDENT_AMBULATORY_CARE_PROVIDER_SITE_OTHER): Payer: Managed Care, Other (non HMO) | Admitting: Family

## 2019-05-06 VITALS — BP 137/90 | Wt 198.0 lb

## 2019-05-06 DIAGNOSIS — R21 Rash and other nonspecific skin eruption: Secondary | ICD-10-CM

## 2019-05-06 DIAGNOSIS — F329 Major depressive disorder, single episode, unspecified: Secondary | ICD-10-CM

## 2019-05-06 DIAGNOSIS — F32A Depression, unspecified: Secondary | ICD-10-CM

## 2019-05-06 MED ORDER — VENLAFAXINE HCL ER 150 MG PO CP24: 150.0000 mg | ORAL_CAPSULE | Freq: Every day | ORAL | 1 refills | Status: DC

## 2019-05-06 MED ORDER — BUPROPION HCL ER (XL) 150 MG PO TB24: 150.0000 mg | ORAL_TABLET | Freq: Every day | ORAL | 1 refills | Status: DC

## 2019-05-06 NOTE — Progress Notes (Signed)
Virtual Visit via Video Note  I connected with Wanda Williams on 05/06/19 at 10:20 AM EST by a video enabled telemedicine application and verified that I am speaking with the correct person using two identifiers.  Location: Patient: home Provider: work   I discussed the limitations of evaluation and management by telemedicine and the availability of in person appointments. The patient expressed understanding and agreed to proceed.  History of Present Illness:  Patient is a 49 yr old female who presents today for follow up of her depression.  We last saw her on 04/05/19.  At that time we increased her effexor xr from 75mg  to 150 mg once daily.  Feels like she gets too emotional.  States that she is sleeping fair.  Woke up 3 times during the night last night. She is also trying to get used to her CPAP machine.    Skin rash- last visit we gave her a prednisone taper due to pruritis. She reports significant improvement since last visit.   Past Medical History:  Diagnosis Date  . Allergy   . Angio-edema   . Anxiety   . Depression   . Eczema   . HTN (hypertension)   . Sleep apnea    uses CPAP nightly     Social History   Socioeconomic History  . Marital status: Married    Spouse name: Not on file  . Number of children: Not on file  . Years of education: Not on file  . Highest education level: Not on file  Occupational History  . Not on file  Tobacco Use  . Smoking status: Never Smoker  . Smokeless tobacco: Never Used  Substance and Sexual Activity  . Alcohol use: Yes    Alcohol/week: 0.0 standard drinks    Comment: occasional wine  . Drug use: No  . Sexual activity: Yes    Birth control/protection: Surgical, Post-menopausal    Comment: BTL  Other Topics Concern  . Not on file  Social History Narrative   Works at [employer]   One daughter age 44   One daughter is age 51   Enjoys cross stitching, playing games on her cell phone.     2 dogs   Completed 12th grade.     Social Determinants of Health   Financial Resource Strain:   . Difficulty of Paying Living Expenses: Not on file  Food Insecurity:   . Worried About Charity fundraiser in the Last Year: Not on file  . Ran Out of Food in the Last Year: Not on file  Transportation Needs:   . Lack of Transportation (Medical): Not on file  . Lack of Transportation (Non-Medical): Not on file  Physical Activity:   . Days of Exercise per Week: Not on file  . Minutes of Exercise per Session: Not on file  Stress:   . Feeling of Stress : Not on file  Social Connections:   . Frequency of Communication with Friends and Family: Not on file  . Frequency of Social Gatherings with Friends and Family: Not on file  . Attends Religious Services: Not on file  . Active Member of Clubs or Organizations: Not on file  . Attends Archivist Meetings: Not on file  . Marital Status: Not on file  Intimate Partner Violence:   . Fear of Current or Ex-Partner: Not on file  . Emotionally Abused: Not on file  . Physically Abused: Not on file  . Sexually Abused: Not on file  Past Surgical History:  Procedure Laterality Date  . BREAST LUMPECTOMY WITH RADIOACTIVE SEED LOCALIZATION Left 11/27/2018   Procedure: RADIOACTIVE SEED GUIDED LEFT BREAST LUMPECTOMY;  Surgeon: Erroll Luna, MD;  Location: [place];  Service: General;  Laterality: Left;  . CESAREAN SECTION     x 2  . DILATION AND CURETTAGE OF UTERUS    . THYROID CYST EXCISION    . TUBAL LIGATION  2006    Family History  Problem Relation Age of Onset  . Heart disease Father        heart murmur  . Parkinson's disease Father   . Hypertension Mother   . Coronary artery disease Paternal Uncle        2 uncles (both died before 56)  . Coronary artery disease Paternal Aunt        MI, died before 15  . Melanoma Brother 67       melanoma  . Allergic rhinitis Sister   . Colon cancer Neg Hx   . Rectal cancer Neg Hx   . Stomach cancer  Neg Hx   . Esophageal cancer Neg Hx     Allergies  Allergen Reactions  . Ace Inhibitors Cough    Current Outpatient Medications on File Prior to Visit  Medication Sig Dispense Refill  . amLODipine (NORVASC) 2.5 MG tablet TAKE ONE TABLET BY MOUTH DAILY 30 tablet 2  . aspirin-acetaminophen-caffeine (EXCEDRIN MIGRAINE) 250-250-65 MG tablet Take 1-2 tablets by mouth every 6 (six) hours as needed for headache or migraine.     Marland Kitchen augmented betamethasone dipropionate (DIPROLENE-AF) 0.05 % cream Apply topically as needed.     . meloxicam (MOBIC) 7.5 MG tablet Take 7.5 mg by mouth once a day (Patient taking differently: at bedtime. Take 7.5 mg by mouth once a day) 30 tablet 3  . Multiple Vitamins-Minerals (MULTIVITAMIN WITH MINERALS) tablet Take 1 tablet by mouth at bedtime.     . predniSONE (DELTASONE) 10 MG tablet 4 tabs by mouth once daily for 2 days, then 3 tabs daily x 2 days, then 2 tabs daily x 2 days, then 1 tab daily x 2 days 20 tablet 0  . sulfamethoxazole-trimethoprim (BACTRIM) 400-80 MG tablet Take 1 tablet by mouth 2 (two) times daily. 10 tablet 0  . triamcinolone (KENALOG) 0.025 % ointment Apply 1 application topically as needed.     . valsartan-hydrochlorothiazide (DIOVAN-HCT) 160-25 MG tablet TAKE ONE TABLET BY MOUTH DAILY (Patient taking differently: at bedtime. ) 30 tablet 5  . venlafaxine XR (EFFEXOR XR) 150 MG 24 hr capsule Take 1 capsule (150 mg total) by mouth daily with breakfast. 30 capsule 1  . vitamin B-12 (CYANOCOBALAMIN) 100 MCG tablet Take 100 mcg by mouth at bedtime.      No current facility-administered medications on file prior to visit.    Wt 198 lb (89.8 kg)   LMP 04/20/2016 (LMP Unknown) Comment: BTL  BMI 42.10 kg/m       Observations/Objective:   Gen: Awake, alert, no acute distress Resp: Breathing is even and non-labored Psych: calm/pleasant demeanor, slightly flattened affect Neuro: Alert and Oriented x 3, + facial symmetry, speech is  clear.   Assessment and Plan:  Depression- uncontrolled. Will continue effexor at current dosing. Will also add wellbutrin xl once daily.  Suggested that she establish with a counselor and I gave her the number for [facility] for her to call.   Skin rash- resolved. Notes that if she gets "really upset" she will get  some mild itching, "like the creepy crawlys." Will see how she does with the addition of wellbutrin. Could consider addition of atarax prn next visit if no improvement. This could help both with itching and anxiety.   Past Medical History:  Diagnosis Date  . Allergy   . Angio-edema   . Anxiety   . Depression   . Eczema   . HTN (hypertension)   . Sleep apnea    uses CPAP nightly     Social History   Socioeconomic History  . Marital status: Married    Spouse name: Not on file  . Number of children: Not on file  . Years of education: Not on file  . Highest education level: Not on file  Occupational History  . Not on file  Tobacco Use  . Smoking status: Never Smoker  . Smokeless tobacco: Never Used  Substance and Sexual Activity  . Alcohol use: Yes    Alcohol/week: 0.0 standard drinks    Comment: occasional wine  . Drug use: No  . Sexual activity: Yes    Birth control/protection: Surgical, Post-menopausal    Comment: BTL  Other Topics Concern  . Not on file  Social History Narrative   Works at [employer]   One daughter age 42   One daughter is age 38   Enjoys cross stitching, playing games on her cell phone.     2 dogs   Completed 12th grade.    Social Determinants of Health   Financial Resource Strain:   . Difficulty of Paying Living Expenses: Not on file  Food Insecurity:   . Worried About Charity fundraiser in the Last Year: Not on file  . Ran Out of Food in the Last Year: Not on file  Transportation Needs:   . Lack of Transportation (Medical): Not on file  . Lack of Transportation (Non-Medical): Not on file  Physical  Activity:   . Days of Exercise per Week: Not on file  . Minutes of Exercise per Session: Not on file  Stress:   . Feeling of Stress : Not on file  Social Connections:   . Frequency of Communication with Friends and Family: Not on file  . Frequency of Social Gatherings with Friends and Family: Not on file  . Attends Religious Services: Not on file  . Active Member of Clubs or Organizations: Not on file  . Attends Archivist Meetings: Not on file  . Marital Status: Not on file  Intimate Partner Violence:   . Fear of Current or Ex-Partner: Not on file  . Emotionally Abused: Not on file  . Physically Abused: Not on file  . Sexually Abused: Not on file    Past Surgical History:  Procedure Laterality Date  . BREAST LUMPECTOMY WITH RADIOACTIVE SEED LOCALIZATION Left 11/27/2018   Procedure: RADIOACTIVE SEED GUIDED LEFT BREAST LUMPECTOMY;  Surgeon: Erroll Luna, MD;  Location: [place];  Service: General;  Laterality: Left;  . CESAREAN SECTION     x 2  . DILATION AND CURETTAGE OF UTERUS    . THYROID CYST EXCISION    . TUBAL LIGATION  2006    Family History  Problem Relation Age of Onset  . Heart disease Father        heart murmur  . Parkinson's disease Father   . Hypertension Mother   . Coronary artery disease Paternal Uncle        2 uncles (both died before 75)  . Coronary  artery disease Paternal Aunt        MI, died before 55  . Melanoma Brother 10       melanoma  . Allergic rhinitis Sister   . Colon cancer Neg Hx   . Rectal cancer Neg Hx   . Stomach cancer Neg Hx   . Esophageal cancer Neg Hx     Allergies  Allergen Reactions  . Ace Inhibitors Cough    Current Outpatient Medications on File Prior to Visit  Medication Sig Dispense Refill  . amLODipine (NORVASC) 2.5 MG tablet TAKE ONE TABLET BY MOUTH DAILY 30 tablet 2  . aspirin-acetaminophen-caffeine (EXCEDRIN MIGRAINE) 250-250-65 MG tablet Take 1-2 tablets by mouth every 6 (six) hours as  needed for headache or migraine.     Marland Kitchen augmented betamethasone dipropionate (DIPROLENE-AF) 0.05 % cream Apply topically as needed.     . meloxicam (MOBIC) 7.5 MG tablet Take 7.5 mg by mouth once a day (Patient taking differently: at bedtime. Take 7.5 mg by mouth once a day) 30 tablet 3  . Multiple Vitamins-Minerals (MULTIVITAMIN WITH MINERALS) tablet Take 1 tablet by mouth at bedtime.     . predniSONE (DELTASONE) 10 MG tablet 4 tabs by mouth once daily for 2 days, then 3 tabs daily x 2 days, then 2 tabs daily x 2 days, then 1 tab daily x 2 days 20 tablet 0  . sulfamethoxazole-trimethoprim (BACTRIM) 400-80 MG tablet Take 1 tablet by mouth 2 (two) times daily. 10 tablet 0  . triamcinolone (KENALOG) 0.025 % ointment Apply 1 application topically as needed.     . valsartan-hydrochlorothiazide (DIOVAN-HCT) 160-25 MG tablet TAKE ONE TABLET BY MOUTH DAILY (Patient taking differently: at bedtime. ) 30 tablet 5  . vitamin B-12 (CYANOCOBALAMIN) 100 MCG tablet Take 100 mcg by mouth at bedtime.      No current facility-administered medications on file prior to visit.    BP 137/90   Wt 198 lb (89.8 kg)   LMP 04/20/2016 (LMP Unknown) Comment: BTL  BMI 42.10 kg/m       Follow Up Instructions:    I discussed the assessment and treatment plan with the patient. The patient was provided an opportunity to ask questions and all were answered. The patient agreed with the plan and demonstrated an understanding of the instructions.   The patient was advised to call back or seek an in-person evaluation if the symptoms worsen or if the condition fails to improve as anticipated.  [name], NP

## 2019-06-16 ENCOUNTER — Encounter: Payer: Self-pay | Admitting: Family

## 2019-06-17 ENCOUNTER — Other Ambulatory Visit: Payer: Self-pay

## 2019-06-17 ENCOUNTER — Ambulatory Visit (INDEPENDENT_AMBULATORY_CARE_PROVIDER_SITE_OTHER): Payer: Managed Care, Other (non HMO) | Admitting: Family

## 2019-06-17 VITALS — BP 122/96 | HR 89

## 2019-06-17 DIAGNOSIS — F329 Major depressive disorder, single episode, unspecified: Secondary | ICD-10-CM | POA: Diagnosis not present

## 2019-06-17 DIAGNOSIS — R21 Rash and other nonspecific skin eruption: Secondary | ICD-10-CM

## 2019-06-17 DIAGNOSIS — F32A Depression, unspecified: Secondary | ICD-10-CM

## 2019-06-17 DIAGNOSIS — I10 Essential (primary) hypertension: Secondary | ICD-10-CM

## 2019-06-17 MED ORDER — BUPROPION HCL ER (XL) 150 MG PO TB24: 150.0000 mg | ORAL_TABLET | Freq: Every day | ORAL | 1 refills | Status: DC

## 2019-06-17 NOTE — Progress Notes (Signed)
Virtual Visit via Video Note  I connected with Wanda Williams on 06/17/19 at 11:00 AM EDT by a video enabled telemedicine application and verified that I am speaking with the correct person using two identifiers.  Location: Patient: home Provider: home   I discussed the limitations of evaluation and management by telemedicine and the availability of in person appointments. The patient expressed understanding and agreed to proceed.  History of Present Illness:  Patient is a 49 yr old female who presents today for follow up.  Last visit we added wellbutrin to her regimen for depression. Since starting wellbutrin she notes that her depression symptoms/mood has improves.  "I feel like I am doing a lot better."  Notes that she is tolerating wellbutrin. She does note that she sometimes has trouble word finding and thinks this may have started after she began wellbutrin.    Reports that her mood is improved.    Reports skin rash on her legs is "a lot better."    BP Readings from Last 3 Encounters:  05/06/19 137/90  04/05/19 134/80  01/14/19 123/74      Observations/Objective:   Gen: Awake, alert, no acute distress Resp: Breathing is even and non-labored Psych: calm/pleasant demeanor Neuro: Alert and Oriented x 3, + facial symmetry, speech is clear. Skin: resolution or rash on legs. Some discoloration from previous excoriation noted.    Assessment and Plan:  Depression- improved with the addition of wellbutrin.  Advised pt to continue wellbutrin. We will monitor her concern about word finding difficulty. Anxiety/depression can sometimes contribute to this as well. She will let me know if her symptoms worsen.   HTN- dbp is mildly elevated today. I have advised the pt to continue to monitor her bp at home and let me know if bp running >140/90. Otherwise will plan to recheck in 3 months. Continue amlodipine.  Skin rash- resolved.    Follow Up Instructions:    I discussed the  assessment and treatment plan with the patient. The patient was provided an opportunity to ask questions and all were answered. The patient agreed with the plan and demonstrated an understanding of the instructions.   The patient was advised to call back or seek an in-person evaluation if the symptoms worsen or if the condition fails to improve as anticipated.  [name], NP

## 2019-06-30 ENCOUNTER — Other Ambulatory Visit: Payer: Self-pay | Admitting: Family

## 2019-10-29 ENCOUNTER — Other Ambulatory Visit: Payer: Self-pay | Admitting: Family

## 2020-01-24 ENCOUNTER — Telehealth: Payer: Self-pay | Admitting: Family

## 2020-01-24 ENCOUNTER — Other Ambulatory Visit: Payer: Self-pay | Admitting: Family

## 2020-01-24 NOTE — Telephone Encounter (Signed)
Please contact pt and let her know that I sent a 1 month supply of her medication, but she is past due for follow up. Please schedule follow up OV.

## 2020-01-26 ENCOUNTER — Other Ambulatory Visit: Payer: Self-pay | Admitting: Family

## 2020-01-28 NOTE — Telephone Encounter (Signed)
Left voice mail to schedule appt

## 2020-02-03 ENCOUNTER — Other Ambulatory Visit: Payer: Self-pay | Admitting: Family

## 2020-02-03 DIAGNOSIS — Z1231 Encounter for screening mammogram for malignant neoplasm of breast: Secondary | ICD-10-CM

## 2020-02-28 ENCOUNTER — Other Ambulatory Visit: Payer: Self-pay | Admitting: Family

## 2020-03-11 ENCOUNTER — Ambulatory Visit: Payer: Managed Care, Other (non HMO) | Admitting: Family

## 2020-03-17 ENCOUNTER — Other Ambulatory Visit: Payer: Self-pay

## 2020-03-17 ENCOUNTER — Ambulatory Visit
Admission: RE | Admit: 2020-03-17 | Discharge: 2020-03-17 | Disposition: A | Discharge status: Home / Self Care | Payer: Managed Care, Other (non HMO) | Source: Ambulatory Visit | Attending: Family | Admitting: Family

## 2020-03-17 ENCOUNTER — Telehealth: Payer: Self-pay | Admitting: Family

## 2020-03-17 DIAGNOSIS — Z1231 Encounter for screening mammogram for malignant neoplasm of breast: Secondary | ICD-10-CM

## 2020-03-17 NOTE — Telephone Encounter (Signed)
Please let pt know that the radiologist would like her to complete some additional breast images for further evaluation. Let me know if she has not been contacted by them about a follow up appointment in 1 week.   

## 2020-03-18 ENCOUNTER — Other Ambulatory Visit: Payer: Self-pay | Admitting: Family

## 2020-03-18 DIAGNOSIS — R928 Other abnormal and inconclusive findings on diagnostic imaging of breast: Secondary | ICD-10-CM

## 2020-03-18 NOTE — Telephone Encounter (Signed)
Patient has been contacted by radiology and was scheduled for follow up additional images 04-01-2020

## 2020-03-25 ENCOUNTER — Ambulatory Visit: Payer: Managed Care, Other (non HMO) | Admitting: Family

## 2020-04-01 ENCOUNTER — Ambulatory Visit: Payer: Managed Care, Other (non HMO)

## 2020-04-01 ENCOUNTER — Ambulatory Visit
Admission: RE | Admit: 2020-04-01 | Discharge: 2020-04-01 | Disposition: A | Discharge status: Home / Self Care | Payer: Managed Care, Other (non HMO) | Source: Ambulatory Visit | Attending: Family | Admitting: Family

## 2020-04-01 ENCOUNTER — Other Ambulatory Visit: Payer: Self-pay

## 2020-04-01 DIAGNOSIS — R928 Other abnormal and inconclusive findings on diagnostic imaging of breast: Secondary | ICD-10-CM

## 2020-04-07 ENCOUNTER — Other Ambulatory Visit: Payer: Self-pay | Admitting: Family

## 2020-04-13 ENCOUNTER — Ambulatory Visit (INDEPENDENT_AMBULATORY_CARE_PROVIDER_SITE_OTHER): Payer: Managed Care, Other (non HMO) | Admitting: Family

## 2020-04-13 ENCOUNTER — Encounter: Payer: Self-pay | Admitting: Family

## 2020-04-13 ENCOUNTER — Other Ambulatory Visit: Payer: Self-pay

## 2020-04-13 VITALS — BP 121/85 | HR 97 | Temp 98.7°F | Resp 16 | Ht <= 58 in | Wt 201.4 lb

## 2020-04-13 DIAGNOSIS — R300 Dysuria: Secondary | ICD-10-CM | POA: Diagnosis not present

## 2020-04-13 DIAGNOSIS — F32A Depression, unspecified: Secondary | ICD-10-CM

## 2020-04-13 DIAGNOSIS — Z23 Encounter for immunization: Secondary | ICD-10-CM

## 2020-04-13 DIAGNOSIS — I10 Essential (primary) hypertension: Secondary | ICD-10-CM

## 2020-04-13 LAB — URINALYSIS, ROUTINE W REFLEX MICROSCOPIC
Bilirubin Urine: NEGATIVE
Hgb urine dipstick: NEGATIVE
Ketones, ur: NEGATIVE
Leukocytes,Ua: NEGATIVE
Nitrite: NEGATIVE
RBC / HPF: NONE SEEN (ref 0–?)
Specific Gravity, Urine: 1.02 (ref 1.000–1.030)
Total Protein, Urine: NEGATIVE
Urine Glucose: NEGATIVE
Urobilinogen, UA: 0.2 (ref 0.0–1.0)
pH: 6.5 (ref 5.0–8.0)

## 2020-04-13 LAB — BASIC METABOLIC PANEL
BUN: 13 mg/dL (ref 6–23)
CO2: 32 mEq/L (ref 19–32)
Calcium: 9.5 mg/dL (ref 8.4–10.5)
Chloride: 98 mEq/L (ref 96–112)
Creatinine, Ser: 0.61 mg/dL (ref 0.40–1.20)
GFR: 103.06 mL/min (ref >60.00)
Glucose, Bld: 114 mg/dL — ABNORMAL HIGH (ref 70–99)
Potassium: 3.7 mEq/L (ref 3.5–5.1)
Sodium: 137 mEq/L (ref 135–145)

## 2020-04-13 MED ORDER — VALSARTAN-HYDROCHLOROTHIAZIDE 160-25 MG PO TABS: 1.0000 | ORAL_TABLET | Freq: Every day | ORAL | 1 refills | Status: DC

## 2020-04-13 MED ORDER — VENLAFAXINE HCL ER 150 MG PO CP24: ORAL_CAPSULE | ORAL | 1 refills | Status: DC

## 2020-04-13 MED ORDER — BUPROPION HCL ER (XL) 150 MG PO TB24: 150.0000 mg | ORAL_TABLET | Freq: Every day | ORAL | 1 refills | Status: DC

## 2020-04-13 MED ORDER — AMLODIPINE BESYLATE 2.5 MG PO TABS: 2.5000 mg | ORAL_TABLET | Freq: Every day | ORAL | 1 refills | Status: DC

## 2020-04-13 NOTE — Progress Notes (Signed)
Subjective:    Patient ID: Wanda Williams, female    DOB: January 03, 1971, 50 y.o.   MRN: [id number]  HPI  Patient is a 50 yr old female who presents today for follow up.  HTN- maintained on diovan-hct 160-25 mg and amlodipine 2.5mg  once daily.  BP Readings from Last 3 Encounters:  04/13/20 121/85  06/17/19 (!) 122/96  05/06/19 137/90   Depression- currently maintained on wellbutrin xl 150mg  once daily and effexor xr 150mg  once daily.    Review of Systems See HPI  Past Medical History:  Diagnosis Date  . Allergy   . Angio-edema   . Anxiety   . Depression   . Eczema   . HTN (hypertension)   . Sleep apnea    uses CPAP nightly     Social History   Socioeconomic History  . Marital status: Married    Spouse name: Not on file  . Number of children: Not on file  . Years of education: Not on file  . Highest education level: Not on file  Occupational History  . Not on file  Tobacco Use  . Smoking status: Never Smoker  . Smokeless tobacco: Never Used  Vaping Use  . Vaping Use: Never used  Substance and Sexual Activity  . Alcohol use: Yes    Alcohol/week: 0.0 standard drinks    Comment: occasional wine  . Drug use: No  . Sexual activity: Yes    Birth control/protection: Surgical, Post-menopausal    Comment: BTL  Other Topics Concern  . Not on file  Social History Narrative   Works at [employer]   One daughter age 31   One daughter is age 52   Enjoys cross stitching, playing games on her cell phone.     2 dogs   Completed 12th grade.    Social Determinants of Health   Financial Resource Strain: Not on file  Food Insecurity: Not on file  Transportation Needs: Not on file  Physical Activity: Not on file  Stress: Not on file  Social Connections: Not on file  Intimate Partner Violence: Not on file    Past Surgical History:  Procedure Laterality Date  . BREAST LUMPECTOMY WITH RADIOACTIVE SEED LOCALIZATION Left 11/27/2018   Procedure: RADIOACTIVE SEED  GUIDED LEFT BREAST LUMPECTOMY;  Surgeon: Erroll Luna, MD;  Location: [facility];  Service: General;  Laterality: Left;  . CESAREAN SECTION     x 2  . DILATION AND CURETTAGE OF UTERUS    . THYROID CYST EXCISION    . TUBAL LIGATION  2006    Family History  Problem Relation Age of Onset  . Heart disease Father        heart murmur  . Parkinson's disease Father   . Hypertension Mother   . Coronary artery disease Paternal Uncle        2 uncles (both died before 31)  . Coronary artery disease Paternal Aunt        MI, died before 65  . Melanoma Brother 30       melanoma  . Allergic rhinitis Sister   . Colon cancer Neg Hx   . Rectal cancer Neg Hx   . Stomach cancer Neg Hx   . Esophageal cancer Neg Hx     Allergies  Allergen Reactions  . Ace Inhibitors Cough    Current Outpatient Medications on File Prior to Visit  Medication Sig Dispense Refill  . amLODipine (NORVASC) 2.5 MG tablet Take 1 tablet (  2.5 mg total) by mouth daily. 30 tablet 0  . aspirin-acetaminophen-caffeine (EXCEDRIN MIGRAINE) 250-250-65 MG tablet Take 1-2 tablets by mouth every 6 (six) hours as needed for headache or migraine.     Marland Kitchen buPROPion (WELLBUTRIN XL) 150 MG 24 hr tablet TAKE ONE TABLET BY MOUTH DAILY 14 tablet 0  . meloxicam (MOBIC) 7.5 MG tablet Take 7.5 mg by mouth once a day (Patient taking differently: at bedtime. Take 7.5 mg by mouth once a day) 30 tablet 3  . Multiple Vitamins-Minerals (MULTIVITAMIN WITH MINERALS) tablet Take 1 tablet by mouth at bedtime.     . triamcinolone (KENALOG) 0.025 % ointment Apply 1 application topically as needed.     . valsartan-hydrochlorothiazide (DIOVAN-HCT) 160-25 MG tablet Take 1 tablet by mouth daily. 30 tablet 0  . venlafaxine XR (EFFEXOR-XR) 150 MG 24 hr capsule TAKE ONE CAPSULE BY MOUTH DAILY WITH BREAKFAST 90 capsule 1  . vitamin B-12 (CYANOCOBALAMIN) 100 MCG tablet Take 100 mcg by mouth at bedtime.      No current facility-administered  medications on file prior to visit.    BP 121/85 (BP Location: Left Arm, Patient Position: Sitting, Cuff Size: Small)   Pulse 97   Temp 98.7 F (37.1 C) (Oral)   Resp 16   Ht 4\' 10"  (1.473 m)   Wt 201 lb 6.4 oz (91.4 kg)   LMP 04/20/2016 (LMP Unknown) Comment: BTL  SpO2 98%   BMI 42.09 kg/m       Objective:   Physical Exam Constitutional:      Appearance: She is well-developed and well-nourished.  Cardiovascular:     Rate and Rhythm: Normal rate and regular rhythm.     Heart sounds: Normal heart sounds. No murmur heard.   Pulmonary:     Effort: Pulmonary effort is normal. No respiratory distress.     Breath sounds: Normal breath sounds. No wheezing.  Psychiatric:        Mood and Affect: Mood and affect normal.        Behavior: Behavior normal.        Thought Content: Thought content normal.        Judgment: Judgment normal.           Assessment & Plan:  HTN-blood pressure is at goal. Continue diovan HCT 160-25mg  and amlodipine 2.5mg  once daily. Obtain follow up bmet.  Depression- stable on wellbutrin xl 150mg  and effexor xl 150mg . Continue same.  Flu shot today.  Recommended that she get her covid booster at work.   Dysuria- check Urine culture and UA to rule out UTI.   This visit occurred during the SARS-CoV-2 public health emergency.  Safety protocols were in place, including screening questions prior to the visit, additional usage of staff PPE, and extensive cleaning of exam room while observing appropriate contact time as indicated for disinfecting solutions.

## 2020-04-15 ENCOUNTER — Telehealth: Payer: Self-pay | Admitting: Family

## 2020-04-15 LAB — URINE CULTURE
MICRO NUMBER:: 11502540
SPECIMEN QUALITY:: ADEQUATE

## 2020-04-15 MED ORDER — CEPHALEXIN 500 MG PO CAPS: 500.0000 mg | ORAL_CAPSULE | Freq: Two times a day (BID) | ORAL | 0 refills | Status: DC

## 2020-04-15 NOTE — Telephone Encounter (Signed)
Will add keflex for UTI.

## 2020-05-13 ENCOUNTER — Other Ambulatory Visit (HOSPITAL_COMMUNITY)
Admission: RE | Admit: 2020-05-13 | Discharge: 2020-05-13 | Disposition: A | Discharge status: Home / Self Care | Payer: Managed Care, Other (non HMO) | Source: Ambulatory Visit | Attending: Family | Admitting: Family

## 2020-05-13 ENCOUNTER — Encounter: Payer: Self-pay | Admitting: Family

## 2020-05-13 ENCOUNTER — Ambulatory Visit (INDEPENDENT_AMBULATORY_CARE_PROVIDER_SITE_OTHER): Payer: Managed Care, Other (non HMO) | Admitting: Family

## 2020-05-13 ENCOUNTER — Other Ambulatory Visit: Payer: Self-pay

## 2020-05-13 VITALS — BP 132/89 | HR 84 | Temp 98.4°F | Resp 16 | Ht <= 58 in | Wt 206.0 lb

## 2020-05-13 DIAGNOSIS — Z01419 Encounter for gynecological examination (general) (routine) without abnormal findings: Secondary | ICD-10-CM | POA: Insufficient documentation

## 2020-05-13 DIAGNOSIS — H9190 Unspecified hearing loss, unspecified ear: Secondary | ICD-10-CM

## 2020-05-13 DIAGNOSIS — Z0000 Encounter for general adult medical examination without abnormal findings: Secondary | ICD-10-CM | POA: Diagnosis not present

## 2020-05-13 DIAGNOSIS — Z23 Encounter for immunization: Secondary | ICD-10-CM

## 2020-05-13 MED ORDER — METHOCARBAMOL 500 MG PO TABS: 500.0000 mg | ORAL_TABLET | Freq: Three times a day (TID) | ORAL | 0 refills | Status: DC | PRN

## 2020-05-13 NOTE — Patient Instructions (Signed)
Please get a covid booster. Try to add 30 minutes of walking 5 days a week. Try to eat a healthy dinner each night instead of junk food. For your leg pain, continue meloxicam as needed and add Robaxin (muscle relaxer) as needed.

## 2020-05-13 NOTE — Progress Notes (Signed)
Subjective:    Patient ID: Wanda Williams, female    DOB: 08-11-1970, 50 y.o.   MRN: [id number]  HPI  Patient is a 50 yr old female who presents today for cpx.  Immunizations:  Pfizer x 2, flu shot up to date, td due, shingrix due Diet: sometimes healthy BP Readings from Last 3 Encounters:  05/13/20 132/89  04/13/20 121/85  06/17/19 (!) 122/96  Exercise:  No  Colonoscopy: 2020 Pap Smear:  2018 Mammogram: 1/22 Vision: up to date Dental: up to date  Reports pain in the right thigh/tightness. Tried a Production assistant, radio but only had brief relief.   Review of Systems  Constitutional: Negative for unexpected weight change.  HENT: Positive for hearing loss (was told 80 % hearing ). Negative for rhinorrhea.   Eyes: Negative for visual disturbance.  Respiratory: Negative for cough and shortness of breath.   Cardiovascular: Negative for chest pain.  Gastrointestinal: Negative for constipation and diarrhea.  Genitourinary: Negative for dysuria and frequency.  Musculoskeletal: Positive for arthralgias (hands- tingling.  declines referral for carpal tunnel at this time).  Skin: Negative for rash.  Neurological: Negative for headaches.  Hematological: Negative for adenopathy.  Psychiatric/Behavioral:       Mood is "pretty good."     Past Medical History:  Diagnosis Date  . Allergy   . Angio-edema   . Anxiety   . Depression   . Eczema   . HTN (hypertension)   . Sleep apnea    uses CPAP nightly     Social History   Socioeconomic History  . Marital status: Married    Spouse name: Not on file  . Number of children: Not on file  . Years of education: Not on file  . Highest education level: Not on file  Occupational History  . Not on file  Tobacco Use  . Smoking status: Never Smoker  . Smokeless tobacco: Never Used  Vaping Use  . Vaping Use: Never used  Substance and Sexual Activity  . Alcohol use: Yes    Alcohol/week: 0.0 standard drinks    Comment: occasional wine  . Drug  use: No  . Sexual activity: Yes    Birth control/protection: Surgical, Post-menopausal    Comment: BTL  Other Topics Concern  . Not on file  Social History Narrative   Works at [employer]   One daughter age 90   One daughter is age 67   Enjoys cross stitching, playing games on her cell phone.     2 dogs   Completed 12th grade.    Social Determinants of Health   Financial Resource Strain: Not on file  Food Insecurity: Not on file  Transportation Needs: Not on file  Physical Activity: Not on file  Stress: Not on file  Social Connections: Not on file  Intimate Partner Violence: Not on file    Past Surgical History:  Procedure Laterality Date  . BREAST LUMPECTOMY WITH RADIOACTIVE SEED LOCALIZATION Left 11/27/2018   Procedure: RADIOACTIVE SEED GUIDED LEFT BREAST LUMPECTOMY;  Surgeon: Erroll Luna, MD;  Location: [facility];  Service: General;  Laterality: Left;  . CESAREAN SECTION     x 2  . DILATION AND CURETTAGE OF UTERUS    . THYROID CYST EXCISION    . TUBAL LIGATION  2006    Family History  Problem Relation Age of Onset  . Heart disease Father        heart murmur  . Parkinson's disease Father   .  Hypertension Mother   . Coronary artery disease Paternal Uncle        2 uncles (both died before 87)  . Coronary artery disease Paternal Aunt        MI, died before 39  . Melanoma Brother 27       melanoma  . Allergic rhinitis Sister   . Colon cancer Neg Hx   . Rectal cancer Neg Hx   . Stomach cancer Neg Hx   . Esophageal cancer Neg Hx     Allergies  Allergen Reactions  . Ace Inhibitors Cough    Current Outpatient Medications on File Prior to Visit  Medication Sig Dispense Refill  . amLODipine (NORVASC) 2.5 MG tablet Take 1 tablet (2.5 mg total) by mouth daily. 90 tablet 1  . aspirin-acetaminophen-caffeine (EXCEDRIN MIGRAINE) 250-250-65 MG tablet Take 1-2 tablets by mouth every 6 (six) hours as needed for headache or migraine.     Marland Kitchen buPROPion  (WELLBUTRIN XL) 150 MG 24 hr tablet Take 1 tablet (150 mg total) by mouth daily. 90 tablet 1  . cephALEXin (KEFLEX) 500 MG capsule Take 1 capsule (500 mg total) by mouth 2 (two) times daily. 10 capsule 0  . meloxicam (MOBIC) 7.5 MG tablet Take 7.5 mg by mouth once a day (Patient taking differently: at bedtime. Take 7.5 mg by mouth once a day) 30 tablet 3  . Multiple Vitamins-Minerals (MULTIVITAMIN WITH MINERALS) tablet Take 1 tablet by mouth at bedtime.     . triamcinolone (KENALOG) 0.025 % ointment Apply 1 application topically as needed.     . valsartan-hydrochlorothiazide (DIOVAN-HCT) 160-25 MG tablet Take 1 tablet by mouth daily. 90 tablet 1  . venlafaxine XR (EFFEXOR-XR) 150 MG 24 hr capsule TAKE ONE CAPSULE BY MOUTH DAILY WITH BREAKFAST 90 capsule 1  . vitamin B-12 (CYANOCOBALAMIN) 100 MCG tablet Take 100 mcg by mouth at bedtime.      No current facility-administered medications on file prior to visit.    BP 132/89 (BP Location: Right Arm, Patient Position: Sitting, Cuff Size: Large)   Pulse 84   Temp 98.4 F (36.9 C) (Oral)   Resp 16   Ht 4\' 10"  (1.473 m)   Wt 206 lb (93.4 kg)   LMP 04/20/2016 (LMP Unknown) Comment: BTL  SpO2 98%   BMI 43.05 kg/m       Objective:   Physical Exam  Physical Exam  Constitutional: She is oriented to person, place, and time. She appears well-developed and well-nourished. No distress.  HENT:  Head: Normocephalic and atraumatic.  Right Ear: Tympanic membrane and ear canal normal.  Left Ear: Tympanic membrane and ear canal normal.  Mouth/Throat: Not examined- pt wearing mask Eyes: Pupils are equal, round, and reactive to light. No scleral icterus.  Neck: Normal range of motion. No thyromegaly present.  Cardiovascular: Normal rate and regular rhythm.   No murmur heard. Pulmonary/Chest: Effort normal and breath sounds normal. No respiratory distress. He has no wheezes. She has no rales. She exhibits no tenderness.  Abdominal: Soft. Bowel sounds  are normal. She exhibits no distension and no mass. There is no tenderness. There is no rebound and no guarding.  Musculoskeletal: She exhibits no edema.  Lymphadenopathy:    She has no cervical adenopathy.  Neurological: She is alert and oriented to person, place, and time. She has normal patellar reflexes. She exhibits normal muscle tone. Coordination normal.  Skin: Skin is warm and dry.  Psychiatric: She has a normal mood and affect. Her behavior is  normal. Judgment and thought content normal.  Breasts: Examined lying Right: Without masses, retractions, discharge or axillary adenopathy.  Left: Without masses, retractions, discharge or axillary adenopathy.  Inguinal/mons: Normal without inguinal adenopathy  External genitalia: Normal  BUS/Urethra/Skene's glands: Normal  Bladder: Normal  Vagina: atrophic/dry (chaperone present for pelvic exam) Cervix: Normal  Uterus: normal in size, shape and contour. Midline and mobile  Adnexa/parametria:  Rt: Without masses or tenderness.  Lt: Without masses or tenderness.  Anus and perineum: Normal            Assessment & Plan:  Preventative care- discussed diet/exercise/weight loss. Td and shingrix #1 today.  Colo and mammogram up to date.  Pap performed today. Of note, patient had significant discomfort with the standard speculum due to vaginal dryness.  I changed to small speculum and she was better able to tolerate the pelvic exam with this speculum. She c/o hearing loss. Referral was placed to audiology.   This visit occurred during the SARS-CoV-2 public health emergency.  Safety protocols were in place, including screening questions prior to the visit, additional usage of staff PPE, and extensive cleaning of exam room while observing appropriate contact time as indicated for disinfecting solutions.          Assessment & Plan:

## 2020-05-14 LAB — CYTOLOGY - PAP
Adequacy: ABSENT
Comment: NEGATIVE
Diagnosis: NEGATIVE
High risk HPV: NEGATIVE

## 2020-06-10 ENCOUNTER — Ambulatory Visit: Payer: Managed Care, Other (non HMO) | Attending: Family | Admitting: Audiologist

## 2020-06-10 ENCOUNTER — Other Ambulatory Visit: Payer: Self-pay

## 2020-06-10 DIAGNOSIS — H903 Sensorineural hearing loss, bilateral: Secondary | ICD-10-CM | POA: Diagnosis present

## 2020-06-10 NOTE — Procedures (Signed)
Outpatient Audiology and [place], [place]  [id number] [phone]  AUDIOLOGICAL  EVALUATION  NAME: Wanda Williams     DOB:   [birth year]      MRN: [id number]                                                                                     DATE: 06/10/2020     REFERENT: [name], NP STATUS: Outpatient DIAGNOSIS: Sensorineural hearing loss (SNHL), bilateral    History: Wanda Williams was seen for an audiological evaluation.  Wanda Williams is receiving a hearing evaluation due to concerns for difficulty hearing at work and in noise. Wanda Williams has difficulty hearing in background noise, crowds, and when people are at a distance. She works at a [employer] and has a very hard time understanding customers. This difficulty began gradually.  No pain or pressure reported in either ear. Tinnitus denied for both ears. Wanda Williams says she had a hearing test in 2020, the provider there told her she is not a hearing aid candidate but has 80% hearing. She says she is tired of constantly struggling to hear and is ready to make a change.  Medical history negative for any risk factor for hearing loss. No other relevant case history reported.   Evaluation:   Otoscopy showed a clear view of the tympanic membranes, bilaterally  Tympanometry results were consistent with normal middle ear function  Audiometric testing was completed using conventional audiometry with insert transducer. Speech Recognition Thresholds were consistent with pure tone averages. Word Recognition was excellent at conversation and an elevated level. Pure tone thresholds show normal sloping to a mild sensorineural  high frequency hearing loss in both ears. Test results are consistent with mild hearing loss with good speech understanding.   QuickSIN (speech in noise test) showed that speech needs to be at least 6dB louder than noise for her to understand. This is consistent with mild SNR loss for both ears.    Results:  The test results were reviewed with Wanda Williams. Her hearing loss is mild and her word recognition good at conversational level. However her hearing in noise is making it difficult for her to perform her job. She was advised that she can trial an over the counter hearing aid to see if it helps at work. She can also try a training program that improves hearing in noise. See recommendations below.    Recommendations: 1. Wanda Williams has a slight hearing loss that typically would not make her a hearing aid candidate but due to the degree of difficulty she has hearing in noise, then a trial may be beneficial.   She was advised to start with over the counter devices with a clear return policy. If these do not work then [employer] has a good return policy and will let her demo hearing aids. She would need a low gain hearing aid in each ear with some noise abatement. 2. Recommend the [employer] auditory training program.   This program has modules for speech in noise, rapid speech, and filtering out competing talkers. These are areas that Wanda Williams reported difficulty or showed deficit in during today's evaluation. The program adapts to  the person's performance as they progress and can be performed on any phone or computer. Independent peer reviewed studies have confirmed benefit with diligent use.   For more information visit: [email or web address]  Alfonse Alpers  Audiologist, Au.D., CCC-A 06/10/2020  3:50 PM  Cc: [name], NP

## 2020-08-19 ENCOUNTER — Other Ambulatory Visit: Payer: Self-pay

## 2020-08-19 ENCOUNTER — Ambulatory Visit (INDEPENDENT_AMBULATORY_CARE_PROVIDER_SITE_OTHER): Payer: Managed Care, Other (non HMO)

## 2020-08-19 DIAGNOSIS — Z23 Encounter for immunization: Secondary | ICD-10-CM | POA: Diagnosis not present

## 2020-08-19 NOTE — Progress Notes (Signed)
Pt tolerated well. Left Deltoid.

## 2020-11-02 ENCOUNTER — Encounter: Payer: Self-pay | Admitting: Family

## 2020-11-02 NOTE — Telephone Encounter (Signed)
Patient reports she will be in [place] at time of appointment, advised we can no longer do visits out of state. She was scheduled to come intomorrow

## 2020-11-03 ENCOUNTER — Other Ambulatory Visit: Payer: Self-pay

## 2020-11-03 ENCOUNTER — Ambulatory Visit (INDEPENDENT_AMBULATORY_CARE_PROVIDER_SITE_OTHER): Payer: Managed Care, Other (non HMO) | Admitting: Family

## 2020-11-03 VITALS — BP 126/77 | HR 87 | Temp 98.5°F | Resp 16 | Wt 207.0 lb

## 2020-11-03 DIAGNOSIS — E538 Deficiency of other specified B group vitamins: Secondary | ICD-10-CM | POA: Diagnosis not present

## 2020-11-03 DIAGNOSIS — Z23 Encounter for immunization: Secondary | ICD-10-CM | POA: Diagnosis not present

## 2020-11-03 DIAGNOSIS — F418 Other specified anxiety disorders: Secondary | ICD-10-CM

## 2020-11-03 DIAGNOSIS — I10 Essential (primary) hypertension: Secondary | ICD-10-CM

## 2020-11-03 DIAGNOSIS — N950 Postmenopausal bleeding: Secondary | ICD-10-CM | POA: Diagnosis not present

## 2020-11-03 LAB — BASIC METABOLIC PANEL
BUN: 16 mg/dL (ref 6–23)
CO2: 32 mEq/L (ref 19–32)
Calcium: 9.2 mg/dL (ref 8.4–10.5)
Chloride: 99 mEq/L (ref 96–112)
Creatinine, Ser: 0.55 mg/dL (ref 0.40–1.20)
GFR: 105.25 mL/min (ref >60.00)
Glucose, Bld: 102 mg/dL — ABNORMAL HIGH (ref 70–99)
Potassium: 3.6 mEq/L (ref 3.5–5.1)
Sodium: 140 mEq/L (ref 135–145)

## 2020-11-03 LAB — VITAMIN B12: Vitamin B-12: 399 pg/mL (ref 211–911)

## 2020-11-03 MED ORDER — AMLODIPINE BESYLATE 2.5 MG PO TABS: 2.5000 mg | ORAL_TABLET | Freq: Every day | ORAL | 1 refills | Status: DC

## 2020-11-03 MED ORDER — VALSARTAN-HYDROCHLOROTHIAZIDE 160-25 MG PO TABS: 1.0000 | ORAL_TABLET | Freq: Every day | ORAL | 1 refills | Status: DC

## 2020-11-03 MED ORDER — BUPROPION HCL ER (XL) 150 MG PO TB24: 150.0000 mg | ORAL_TABLET | Freq: Every day | ORAL | 1 refills | Status: DC

## 2020-11-03 MED ORDER — VENLAFAXINE HCL ER 150 MG PO CP24: ORAL_CAPSULE | ORAL | 1 refills | Status: DC

## 2020-11-03 NOTE — Assessment & Plan Note (Signed)
Stable, continue wellbutrin xl 150 and effexor xr '150mg'$ .

## 2020-11-03 NOTE — Assessment & Plan Note (Signed)
Blood pressure is at goal. Will obtain amlodipine 2.'5mg'$ , diovan-hct 160-'25mg'$ .

## 2020-11-03 NOTE — Addendum Note (Signed)
Addended by: Jiles Prows on: 11/03/2020 10:23 AM   Modules accepted: Orders

## 2020-11-03 NOTE — Patient Instructions (Signed)
Please complete lab work prior to leaving. You should be contacted about your referral to GYN.

## 2020-11-03 NOTE — Progress Notes (Signed)
Subjective:     Patient ID: Wanda Williams, female    DOB: February 01, 1971, 50 y.o.   MRN: [id number]  Chief Complaint  Patient presents with   Hypertension    Here for follow up   Depression    Here for follow up    Hypertension  Depression       Patient is in today for follow up.  HTN- patient is maintained on amlodipine 2.'5mg'$  once daily as well as diovan hct.  BP Readings from Last 3 Encounters:  11/03/20 126/77  05/13/20 132/89  04/13/20 121/85   Depression- current medications include wellbutrin xl '150mg'$  and Effexor xr '150mg'$ . She reports good mood overall.  Wt Readings from Last 3 Encounters:  11/03/20 207 lb (93.9 kg)  05/13/20 206 lb (93.4 kg)  04/13/20 201 lb 6.4 oz (91.4 kg)   She had spotting x 3 days last week.  LMP previously was at age 44. 2 weeks prior to the bleeding, she noted nipple tenderness and increased libido.   Health Maintenance Due  Topic Date Due   Pneumococcal Vaccine 43-32 Years old (1 - PCV) Never done   COVID-19 Vaccine (3 - Pfizer risk series) 06/29/2019   INFLUENZA VACCINE  10/05/2020    Past Medical History:  Diagnosis Date   Allergy    Angio-edema    Anxiety    Depression    Eczema    HTN (hypertension)    Sleep apnea    uses CPAP nightly    Past Surgical History:  Procedure Laterality Date   BREAST LUMPECTOMY WITH RADIOACTIVE SEED LOCALIZATION Left 11/27/2018   Procedure: RADIOACTIVE SEED GUIDED LEFT BREAST LUMPECTOMY;  Surgeon: Erroll Luna, MD;  Location: [facility];  Service: General;  Laterality: Left;   CESAREAN SECTION     x 2   DILATION AND CURETTAGE OF UTERUS     THYROID CYST EXCISION     TUBAL LIGATION  2006    Family History  Problem Relation Age of Onset   Heart disease Father        heart murmur   Parkinson's disease Father    Hypertension Mother    Coronary artery disease Paternal Uncle        2 uncles (both died before 91)   Coronary artery disease Paternal Aunt        MI, died  before 20   Melanoma Brother 19       melanoma   Allergic rhinitis Sister    Colon cancer Neg Hx    Rectal cancer Neg Hx    Stomach cancer Neg Hx    Esophageal cancer Neg Hx     Social History   Socioeconomic History   Marital status: Married    Spouse name: Not on file   Number of children: Not on file   Years of education: Not on file   Highest education level: Not on file  Occupational History   Not on file  Tobacco Use   Smoking status: Never   Smokeless tobacco: Never  Vaping Use   Vaping Use: Never used  Substance and Sexual Activity   Alcohol use: Yes    Alcohol/week: 0.0 standard drinks    Comment: occasional wine   Drug use: No   Sexual activity: Yes    Birth control/protection: Surgical, Post-menopausal    Comment: BTL  Other Topics Concern   Not on file  Social History Narrative   Works at [employer]   One daughter age  28   One daughter is age 57   Enjoys cross stitching, playing games on her cell phone.     2 dogs   Completed 12th grade.    Social Determinants of Health   Financial Resource Strain: Not on file  Food Insecurity: Not on file  Transportation Needs: Not on file  Physical Activity: Not on file  Stress: Not on file  Social Connections: Not on file  Intimate Partner Violence: Not on file    Outpatient Medications Prior to Visit  Medication Sig Dispense Refill   aspirin-acetaminophen-caffeine (EXCEDRIN MIGRAINE) 250-250-65 MG tablet Take 1-2 tablets by mouth every 6 (six) hours as needed for headache or migraine.      meloxicam (MOBIC) 7.5 MG tablet Take 7.5 mg by mouth once a day (Patient taking differently: at bedtime. Take 7.5 mg by mouth once a day) 30 tablet 3   Multiple Vitamins-Minerals (MULTIVITAMIN WITH MINERALS) tablet Take 1 tablet by mouth at bedtime.      triamcinolone (KENALOG) 0.025 % ointment Apply 1 application topically as needed.      amLODipine (NORVASC) 2.5 MG tablet Take 1 tablet (2.5 mg total) by mouth daily.  90 tablet 1   buPROPion (WELLBUTRIN XL) 150 MG 24 hr tablet Take 1 tablet (150 mg total) by mouth daily. 90 tablet 1   cephALEXin (KEFLEX) 500 MG capsule Take 1 capsule (500 mg total) by mouth 2 (two) times daily. 10 capsule 0   methocarbamol (ROBAXIN) 500 MG tablet Take 1 tablet (500 mg total) by mouth every 8 (eight) hours as needed for muscle spasms. 15 tablet 0   valsartan-hydrochlorothiazide (DIOVAN-HCT) 160-25 MG tablet Take 1 tablet by mouth daily. 90 tablet 1   venlafaxine XR (EFFEXOR-XR) 150 MG 24 hr capsule TAKE ONE CAPSULE BY MOUTH DAILY WITH BREAKFAST 90 capsule 1   vitamin B-12 (CYANOCOBALAMIN) 100 MCG tablet Take 100 mcg by mouth at bedtime.  (Patient not taking: Reported on 11/03/2020)     No facility-administered medications prior to visit.    Allergies  Allergen Reactions   Ace Inhibitors Cough    Review of Systems  Psychiatric/Behavioral:  Positive for depression.      See HPI Objective:    Physical Exam Constitutional:      Appearance: Normal appearance.  Cardiovascular:     Rate and Rhythm: Normal rate and regular rhythm.  Pulmonary:     Effort: Pulmonary effort is normal.     Breath sounds: Normal breath sounds.  Musculoskeletal:        General: No swelling.  Skin:    General: Skin is warm and dry.  Neurological:     Mental Status: She is alert and oriented to person, place, and time.  Psychiatric:        Mood and Affect: Mood normal.        Behavior: Behavior normal.        Thought Content: Thought content normal.        Judgment: Judgment normal.    BP 126/77 (BP Location: Left Arm, Patient Position: Sitting, Cuff Size: Large)   Pulse 87   Temp 98.5 F (36.9 C) (Oral)   Resp 16   Wt 207 lb (93.9 kg)   LMP 04/20/2016 (LMP Unknown) Comment: BTL  SpO2 99%   BMI 43.26 kg/m  Wt Readings from Last 3 Encounters:  11/03/20 207 lb (93.9 kg)  05/13/20 206 lb (93.4 kg)  04/13/20 201 lb 6.4 oz (91.4 kg)       Assessment &  Plan:   Problem List  Items Addressed This Visit       Unprioritized   Post-menopausal bleeding    New. Will refer to GYN for further evaluation.       Relevant Orders   Ambulatory referral to Obstetrics / Gynecology   Essential hypertension - Primary    Blood pressure is at goal. Will obtain amlodipine 2.'5mg'$ , diovan-hct 160-'25mg'$ .       Relevant Medications   amLODipine (NORVASC) 2.5 MG tablet   valsartan-hydrochlorothiazide (DIOVAN-HCT) 160-25 MG tablet   Other Relevant Orders   Basic metabolic panel   Depression with anxiety    Stable, continue wellbutrin xl 150 and effexor xr '150mg'$ .        Relevant Medications   buPROPion (WELLBUTRIN XL) 150 MG 24 hr tablet   venlafaxine XR (EFFEXOR-XR) 150 MG 24 hr capsule   Other Visit Diagnoses     B12 deficiency       Relevant Orders   B12     Not currently on b12 supplementation.   I have discontinued Madelyn J. Ende's vitamin B-12, cephALEXin, and methocarbamol. I am also having her maintain her multivitamin with minerals, aspirin-acetaminophen-caffeine, triamcinolone, meloxicam, amLODipine, buPROPion, valsartan-hydrochlorothiazide, and venlafaxine XR.  Meds ordered this encounter  Medications   amLODipine (NORVASC) 2.5 MG tablet    Sig: Take 1 tablet (2.5 mg total) by mouth daily.    Dispense:  90 tablet    Refill:  1    Requested drug refills are authorized, however, the patient needs further evaluation and/or laboratory testing before further refills are given. Ask her to make an appointment for this.    Order Specific Question:   Supervising Provider    Answer:   Penni Homans A [[id number]]   buPROPion (WELLBUTRIN XL) 150 MG 24 hr tablet    Sig: Take 1 tablet (150 mg total) by mouth daily.    Dispense:  90 tablet    Refill:  1    Needs OV prior to additional refills.    Order Specific Question:   Supervising Provider    Answer:   Penni Homans A [[id number]]   valsartan-hydrochlorothiazide (DIOVAN-HCT) 160-25 MG tablet    Sig: Take 1 tablet by  mouth daily.    Dispense:  90 tablet    Refill:  1    Requested drug refills are authorized, however, the patient needs further evaluation and/or laboratory testing before further refills are given. Ask her to make an appointment for this.    Order Specific Question:   Supervising Provider    Answer:   Penni Homans A [[id number]]   venlafaxine XR (EFFEXOR-XR) 150 MG 24 hr capsule    Sig: TAKE ONE CAPSULE BY MOUTH DAILY WITH BREAKFAST    Dispense:  90 capsule    Refill:  1    Order Specific Question:   Supervising Provider    Answer:   Penni Homans A [id number]

## 2020-11-03 NOTE — Assessment & Plan Note (Addendum)
New. Will refer to GYN for further evaluation.

## 2020-11-18 ENCOUNTER — Ambulatory Visit: Payer: Managed Care, Other (non HMO) | Admitting: Family

## 2020-12-14 ENCOUNTER — Emergency Department (HOSPITAL_BASED_OUTPATIENT_CLINIC_OR_DEPARTMENT_OTHER)
Admission: EM | Admit: 2020-12-14 | Discharge: 2020-12-14 | Disposition: A | Discharge status: Home / Self Care | Payer: Managed Care, Other (non HMO) | Attending: Emergency Medicine | Admitting: Emergency Medicine

## 2020-12-14 ENCOUNTER — Emergency Department (HOSPITAL_BASED_OUTPATIENT_CLINIC_OR_DEPARTMENT_OTHER): Payer: Managed Care, Other (non HMO) | Admitting: Radiology

## 2020-12-14 ENCOUNTER — Encounter: Payer: Self-pay | Admitting: Family Medicine

## 2020-12-14 ENCOUNTER — Telehealth (INDEPENDENT_AMBULATORY_CARE_PROVIDER_SITE_OTHER): Payer: Managed Care, Other (non HMO) | Admitting: Family Medicine

## 2020-12-14 ENCOUNTER — Other Ambulatory Visit: Payer: Self-pay

## 2020-12-14 ENCOUNTER — Encounter: Payer: Self-pay | Admitting: Family

## 2020-12-14 ENCOUNTER — Encounter (HOSPITAL_BASED_OUTPATIENT_CLINIC_OR_DEPARTMENT_OTHER): Payer: Self-pay | Admitting: Obstetrics and Gynecology

## 2020-12-14 VITALS — Temp 98.5°F

## 2020-12-14 DIAGNOSIS — Z79899 Other long term (current) drug therapy: Secondary | ICD-10-CM | POA: Insufficient documentation

## 2020-12-14 DIAGNOSIS — I10 Essential (primary) hypertension: Secondary | ICD-10-CM

## 2020-12-14 DIAGNOSIS — Z20822 Contact with and (suspected) exposure to covid-19: Secondary | ICD-10-CM | POA: Diagnosis not present

## 2020-12-14 DIAGNOSIS — J189 Pneumonia, unspecified organism: Secondary | ICD-10-CM | POA: Insufficient documentation

## 2020-12-14 DIAGNOSIS — R0602 Shortness of breath: Secondary | ICD-10-CM | POA: Diagnosis not present

## 2020-12-14 DIAGNOSIS — R519 Headache, unspecified: Secondary | ICD-10-CM

## 2020-12-14 DIAGNOSIS — R059 Cough, unspecified: Secondary | ICD-10-CM | POA: Diagnosis present

## 2020-12-14 LAB — RESP PANEL BY RT-PCR (FLU A&B, COVID) ARPGX2
Influenza A by PCR: NEGATIVE
Influenza B by PCR: NEGATIVE
SARS Coronavirus 2 by RT PCR: NEGATIVE

## 2020-12-14 MED ORDER — ACETAMINOPHEN 325 MG PO TABS
650.0000 mg | ORAL_TABLET | Freq: Once | ORAL | Status: AC
  Administered 2020-12-14: 650 mg via ORAL
  Filled 2020-12-14: qty 2

## 2020-12-14 MED ORDER — ALBUTEROL SULFATE HFA 108 (90 BASE) MCG/ACT IN AERS
2.0000 | INHALATION_SPRAY | RESPIRATORY_TRACT | Status: DC | PRN
  Administered 2020-12-14: 2 via RESPIRATORY_TRACT
  Filled 2020-12-14: qty 6.7

## 2020-12-14 MED ORDER — DOXYCYCLINE HYCLATE 100 MG PO CAPS: 100.0000 mg | ORAL_CAPSULE | Freq: Two times a day (BID) | ORAL | 0 refills | Status: DC

## 2020-12-14 MED ORDER — IPRATROPIUM-ALBUTEROL 0.5-2.5 (3) MG/3ML IN SOLN
3.0000 mL | Freq: Once | RESPIRATORY_TRACT | Status: AC
  Administered 2020-12-14: 3 mL via RESPIRATORY_TRACT
  Filled 2020-12-14: qty 3

## 2020-12-14 MED ORDER — IPRATROPIUM-ALBUTEROL 0.5-2.5 (3) MG/3ML IN SOLN: 3.0000 mL | Freq: Four times a day (QID) | RESPIRATORY_TRACT | 0 refills | Status: DC | PRN

## 2020-12-14 MED ORDER — DEXAMETHASONE 4 MG PO TABS
4.0000 mg | ORAL_TABLET | Freq: Once | ORAL | Status: AC
  Administered 2020-12-14: 4 mg via ORAL
  Filled 2020-12-14: qty 1

## 2020-12-14 MED ORDER — ALBUTEROL SULFATE HFA 108 (90 BASE) MCG/ACT IN AERS: 1.0000 | INHALATION_SPRAY | Freq: Four times a day (QID) | RESPIRATORY_TRACT | 0 refills | Status: DC | PRN

## 2020-12-14 NOTE — Telephone Encounter (Signed)
I would recommend covid test at home and then virtual visit.

## 2020-12-14 NOTE — ED Triage Notes (Signed)
Patient reports to the ER for pneumonia rule out. Patient reports her grandchildren have been sick with RSV and her significant other had Strep.

## 2020-12-14 NOTE — Discharge Instructions (Addendum)
You were seen here today for worsening cough.  You are being treated for pneumonia with doxycycline and duo nebs.  Doxycycline is an antibiotic that must be taken as prescribed in the course past be completed.  You may use the duo nebs every 4-6 hours as needed for symptoms.  Caution using them with caffeine as they can make you jittery.  Stay well-hydrated with fluids, mainly water.  Work note included in this discharge paperwork.  If you have any concern, new or worsening symptoms as discussed previously, please return to the nearest emergency department.

## 2020-12-14 NOTE — Telephone Encounter (Signed)
Lvm for patient to call back, needs to get a covid test and a virtual visit

## 2020-12-14 NOTE — Progress Notes (Signed)
Virtual Visit via Video Note  Subjective  CC:  Chief Complaint  Patient presents with   Cough    Dry cough, right side pain from coughing.    Headache    Feels like pressure, Ongoing since Friday.   Shortness of Breath    Wheezing   Same day acute visit; PCP not available. New pt to me. Chart reviewed.   I connected with Jeffifer Rabold on 12/14/20 at  4:30 PM EDT by a video enabled telemedicine application and verified that I am speaking with the correct person using two identifiers. Location patient: Home Location provider: [facility] [place], Office Persons participating in the virtual visit: Meggin Latrisha Coiro, Leamon Arnt, MD [place]  I discussed the limitations of evaluation and management by telemedicine and the availability of in person appointments. The patient expressed understanding and agreed to proceed. HPI: Wanda Williams is a 50 y.o. female who was contacted today to address the problems listed above in the chief complaint. 50 yo w/ HTN and w/o h/o pulmonary disease presents with 3 day h/o severe headache described as constant throbbing and head fullness. Also w/ congestion, productive cough and c/o SOB at rest. C/o wheezing. No cp. Gets sob with walking. No leg swelling or pain in the calves. Reports pulse ox was as low as 92 this am but is a bit better now. Denies GI sxs but has low appetite and body aches. Has not checked a covid test but doesn't feel like the covid infections she has had in the past. She has tried OTC medications w/o relief. Denies neck stiffness. "Worst headache of my life". Assessment  1. Severe headache   2. SOB (shortness of breath)   3. Essential hypertension      Plan  Severe headache and sob w/ borderline low pulse ox:  pt appears ill; tearful and mildly SOB on video. Recommend in person evaluation: referred to [facility] ER or UC. Pt agrees to go. Husband will drive. Diff DX includes covid infection,  pneumonia and also needs evaluation for severe headache.   I discussed the assessment and treatment plan with the patient. The patient was provided an opportunity to ask questions and all were answered. The patient agreed with the plan and demonstrated an understanding of the instructions.   The patient was advised to call back or seek an in-person evaluation if the symptoms worsen or if the condition fails to improve as anticipated. Follow up: as needed  Visit date not found  No orders of the defined types were placed in this encounter.     I reviewed the patients updated PMH, FH, and SocHx.    Patient Active Problem List   Diagnosis Date Noted   Post-menopausal bleeding 11/03/2020   Near syncope 12/04/2017   Abnormal EKG 12/04/2017   Morbid obesity ([place]) 12/04/2017   Amenorrhea 03/18/2015   Carpal tunnel syndrome 03/18/2015   Preventative health care 01/22/2014   Depression with anxiety 09/04/2013   Essential hypertension 11/10/2010   Current Meds  Medication Sig   amLODipine (NORVASC) 2.5 MG tablet Take 1 tablet (2.5 mg total) by mouth daily.   aspirin-acetaminophen-caffeine (EXCEDRIN MIGRAINE) 250-250-65 MG tablet Take 1-2 tablets by mouth every 6 (six) hours as needed for headache or migraine.    buPROPion (WELLBUTRIN XL) 150 MG 24 hr tablet Take 1 tablet (150 mg total) by mouth daily.   meloxicam (MOBIC) 7.5 MG tablet Take 7.5 mg by  mouth once a day (Patient taking differently: at bedtime. Take 7.5 mg by mouth once a day)   Multiple Vitamins-Minerals (MULTIVITAMIN WITH MINERALS) tablet Take 1 tablet by mouth at bedtime.    triamcinolone (KENALOG) 0.025 % ointment Apply 1 application topically as needed.    valsartan-hydrochlorothiazide (DIOVAN-HCT) 160-25 MG tablet Take 1 tablet by mouth daily.   venlafaxine XR (EFFEXOR-XR) 150 MG 24 hr capsule TAKE ONE CAPSULE BY MOUTH DAILY WITH BREAKFAST    Allergies: Patient is allergic to ace inhibitors. Family History: Patient  family history includes Allergic rhinitis in her sister; Coronary artery disease in her paternal aunt and paternal uncle; Heart disease in her father; Hypertension in her mother; Melanoma (age of onset: 53) in her brother; Parkinson's disease in her father. Social History:  Patient  reports that she has never smoked. She has never used smokeless tobacco. She reports current alcohol use. She reports that she does not use drugs.  Review of Systems: Constitutional: Negative for fever malaise or anorexia Cardiovascular: negative for chest pain Respiratory: negative for SOB or persistent cough Gastrointestinal: negative for abdominal pain  OBJECTIVE Vitals: Temp 98.5 F (36.9 C) (Temporal)   LMP 04/20/2016 (LMP Unknown) Comment: BTL General: pt is crying and appears ill; mild dyspnea with speaking. No retractions. Holding head.   Leamon Arnt, MD

## 2020-12-14 NOTE — ED Provider Notes (Signed)
[facility] EMERGENCY DEPT Provider Note   CSN: [id number] Arrival date & time: 12/14/20  1540     History Chief Complaint  Patient presents with   Cough    Wanda Williams is a 50 y.o. female presents emergency department for gradual worsening cough since Friday.  Additionally, patient mentions she is having some nasal congestion, headache, subjective fever, myalgias, and wheezing that started yesterday.  She has tried Mucinex with no relief of symptoms.  She reports her spouse and daughter recently had strep.  The patient reports mild shortness of breath.  Denies any chest pain, rhinorrhea, sore throat, ear pain, abdominal pain, nausea, vomiting, or diarrhea.  Medical history includes hypertension and anxiety.  She denies any smoking, drinking, or illicit drug use.  Allergic to ACE inhibitor's.   Cough Associated symptoms: fever (subjective), headaches, myalgias, shortness of breath and wheezing   Associated symptoms: no chest pain, no chills, no ear pain, no rash, no rhinorrhea and no sore throat       Past Medical History:  Diagnosis Date   Allergy    Angio-edema    Anxiety    Depression    Eczema    HTN (hypertension)    Sleep apnea    uses CPAP nightly    Patient Active Problem List   Diagnosis Date Noted   Post-menopausal bleeding 11/03/2020   Near syncope 12/04/2017   Abnormal EKG 12/04/2017   Morbid obesity ([place]) 12/04/2017   Amenorrhea 03/18/2015   Carpal tunnel syndrome 03/18/2015   Preventative health care 01/22/2014   Depression with anxiety 09/04/2013   Essential hypertension 11/10/2010    Past Surgical History:  Procedure Laterality Date   BREAST LUMPECTOMY WITH RADIOACTIVE SEED LOCALIZATION Left 11/27/2018   Procedure: RADIOACTIVE SEED GUIDED LEFT BREAST LUMPECTOMY;  Surgeon: Erroll Luna, MD;  Location: [facility];  Service: General;  Laterality: Left;   CESAREAN SECTION     x 2   DILATION AND CURETTAGE OF UTERUS      THYROID CYST EXCISION     TUBAL LIGATION  2006     OB History     Gravida  4   Para  2   Term  2   Preterm  0   AB  2   Living  2      SAB  2   IAB  0   Ectopic  0   Multiple  0   Live Births  2           Family History  Problem Relation Age of Onset   Heart disease Father        heart murmur   Parkinson's disease Father    Hypertension Mother    Coronary artery disease Paternal Uncle        2 uncles (both died before 38)   Coronary artery disease Paternal Aunt        MI, died before 55   Melanoma Brother 72       melanoma   Allergic rhinitis Sister    Colon cancer Neg Hx    Rectal cancer Neg Hx    Stomach cancer Neg Hx    Esophageal cancer Neg Hx     Social History   Tobacco Use   Smoking status: Never   Smokeless tobacco: Never  Vaping Use   Vaping Use: Never used  Substance Use Topics   Alcohol use: Yes    Alcohol/week: 0.0 standard drinks    Comment: occasional wine  Drug use: No    Home Medications Prior to Admission medications   Medication Sig Start Date End Date Taking? Authorizing Provider  albuterol (VENTOLIN HFA) 108 (90 Base) MCG/ACT inhaler Inhale 1-2 puffs into the lungs every 6 (six) hours as needed for wheezing or shortness of breath. 12/14/20  Yes Sherrell Puller, PA-C  doxycycline (VIBRAMYCIN) 100 MG capsule Take 1 capsule (100 mg total) by mouth 2 (two) times daily. 12/14/20  Yes Sherrell Puller, PA-C  ipratropium-albuterol (DUONEB) 0.5-2.5 (3) MG/3ML SOLN Take 3 mLs by nebulization every 6 (six) hours as needed. 12/14/20  Yes Sherrell Puller, PA-C  amLODipine (NORVASC) 2.5 MG tablet Take 1 tablet (2.5 mg total) by mouth daily. 11/03/20   [name], NP  aspirin-acetaminophen-caffeine (EXCEDRIN MIGRAINE) [phone] MG tablet Take 1-2 tablets by mouth every 6 (six) hours as needed for headache or migraine.     [provider]  buPROPion (WELLBUTRIN XL) 150 MG 24 hr tablet Take 1 tablet (150 mg total) by  mouth daily. 11/03/20   [name], NP  meloxicam (MOBIC) 7.5 MG tablet Take 7.5 mg by mouth once a day Patient taking differently: at bedtime. Take 7.5 mg by mouth once a day 11/26/18   [name], NP  Multiple Vitamins-Minerals (MULTIVITAMIN WITH MINERALS) tablet Take 1 tablet by mouth at bedtime.     [provider]  triamcinolone (KENALOG) 0.025 % ointment Apply 1 application topically as needed.     [provider]  valsartan-hydrochlorothiazide (DIOVAN-HCT) 160-25 MG tablet Take 1 tablet by mouth daily. 11/03/20   [name], NP  venlafaxine XR (EFFEXOR-XR) 150 MG 24 hr capsule TAKE ONE CAPSULE BY MOUTH DAILY WITH BREAKFAST 11/03/20   [name], NP    Allergies    Ace inhibitors  Review of Systems   Review of Systems  Constitutional:  Positive for fever (subjective). Negative for chills.  HENT:  Positive for congestion. Negative for ear pain, rhinorrhea and sore throat.   Eyes:  Negative for pain and visual disturbance.  Respiratory:  Positive for cough, shortness of breath and wheezing.   Cardiovascular:  Negative for chest pain and palpitations.  Gastrointestinal:  Negative for abdominal pain, constipation, diarrhea, nausea and vomiting.  Genitourinary:  Negative for dysuria and hematuria.  Musculoskeletal:  Positive for myalgias. Negative for arthralgias and back pain.  Skin:  Negative for color change and rash.  Neurological:  Positive for headaches. Negative for seizures, syncope, weakness and light-headedness.  All other systems reviewed and are negative.  Physical Exam Updated Vital Signs BP 125/85 (BP Location: Right Arm)   Pulse 85   Temp 98.5 F (36.9 C) (Oral)   Resp 18   Ht 4\' 10"  (1.473 m)   Wt 93.9 kg   LMP 04/20/2016 (LMP Unknown) Comment: BTL  SpO2 100%   BMI 43.26 kg/m   Physical Exam Vitals and nursing note reviewed.  Constitutional:      General: She is not in acute distress.    Appearance: Normal  appearance. She is not toxic-appearing.     Comments: Patient appears uncomfortable.  HENT:     Head: Normocephalic and atraumatic.     Right Ear: Tympanic membrane, ear canal and external ear normal.     Left Ear: Tympanic membrane, ear canal and external ear normal.     Nose: Congestion present.     Comments: Bilateral nasal turbinates erythematous and edematous.    Mouth/Throat:     Mouth: Mucous membranes are moist.     Pharynx:  Oropharynx is clear. No oropharyngeal exudate or posterior oropharyngeal erythema.     Comments: Moist mucous membranes.  No pharyngeal erythema.  Uvula midline.  Airway patent. Eyes:     General: No scleral icterus. Cardiovascular:     Rate and Rhythm: Normal rate and regular rhythm.  Pulmonary:     Effort: Pulmonary effort is normal. No respiratory distress.     Breath sounds: No stridor. Wheezing present.     Comments: Fine inspiratory wheezing throughout lung fields.  Coarse wheezing in bilateral lower lung fields.  No respiratory distress.  Patient satting 98% on room air.  No accessory muscle use, tripoding, or cyanosis noted.  Respiratory rate 18. Abdominal:     General: Abdomen is flat. Bowel sounds are normal.     Palpations: Abdomen is soft.     Tenderness: There is no abdominal tenderness. There is no guarding or rebound.  Musculoskeletal:        General: No deformity.     Cervical back: Normal range of motion.  Skin:    General: Skin is warm and dry.  Neurological:     General: No focal deficit present.     Mental Status: She is alert. Mental status is at baseline.    ED Results / Procedures / Treatments   Labs (all labs ordered are listed, but only abnormal results are displayed) Labs Reviewed  RESP PANEL BY RT-PCR (FLU A&B, COVID) [id number]    EKG None  Radiology DG Chest 2 View  Result Date: 12/14/2020 CLINICAL DATA:  Respiratory distress. EXAM: CHEST - 2 VIEW COMPARISON:  Jul 25, 2017 FINDINGS: Small, ill-defined areas of  atelectasis versus infiltrate are seen within the mid and lower left lung. There is no evidence of a pleural effusion or pneumothorax. The heart size and mediastinal contours are within normal limits. The visualized skeletal structures are unremarkable. IMPRESSION: Mild atelectasis versus infiltrate within the mid and lower left lung. Electronically Signed   By: Virgina Norfolk M.D.   On: 12/14/2020 17:38    Procedures Procedures   Medications Ordered in ED Medications  albuterol (VENTOLIN HFA) 108 (90 Base) MCG/ACT inhaler 2 puff (2 puffs Inhalation Given 12/14/20 1620)  ipratropium-albuterol (DUONEB) 0.5-2.5 (3) MG/3ML nebulizer solution 3 mL (3 mLs Nebulization Given 12/14/20 1758)  acetaminophen (TYLENOL) tablet 650 mg (650 mg Oral Given 12/14/20 1712)  dexamethasone (DECADRON) tablet 4 mg (4 mg Oral Given 12/14/20 1927)    ED Course  I have reviewed the triage vital signs and the nursing notes.  Pertinent labs & imaging results that were available during my care of the patient were reviewed by me and considered in my medical decision making (see chart for details).  On physical exam, patient experiencing wheezing both inspiratory and expiratory.  She denies any smoking, history of asthma, or history of COPD.  She is not hypoxic.  Vital signs stable.  No tripoding, accessory muscle use, cyanosis noted.  Will order albuterol and DuoNeb.  Upon reevaluation, the patient has had 2 puffs of albuterol and a DuoNeb treatment.  The patient reports she is feeling much better, and is not feeling as if she is wheezing so much.  On auscultation, lung exam has improved.  Wheezing has improved.  No inspiratory wheezing auscultated anymore.  Again, on reevaluation, the patient was sleeping peacefully.  The patient reports that was the first time she was able to sleep a while because of her symptoms. VSS.  I personally reviewed and interpreted the patient's labs and  imaging.  COVID and flu negative.  CXR shows mild atelectasis versus infiltrate within the mid and lower left lung.  Based on these findings, she will be put on doxycycline to treat for CAP.  She was given a dose of Decadron in the emergency department as well as Tylenol.  I spoke with patient about the lab and imaging findings.  Discussed the importance of taking antibiotic as prescribed and completing the course.  Prescribed at home DuoNeb nebulizer treatments as her husband has a nebulizer machine.  Advised against taking this treatment with any caffeine or stimulant.  Recommended supportive care.  Return precautions discussed.  Patient agrees to plan.  Patient is stable being discharged home to condition.  I discussed this case with my attending physician who cosigned this note including patient's presenting symptoms, physical exam, and planned diagnostics and interventions. Attending physician stated agreement with plan or made changes to plan which were implemented.    MDM Rules/Calculators/A&P  Final Clinical Impression(s) / ED Diagnoses Final diagnoses:  Community acquired pneumonia, unspecified laterality    Rx / DC Orders ED Discharge Orders          Ordered    doxycycline (VIBRAMYCIN) 100 MG capsule  2 times daily        12/14/20 1859    albuterol (VENTOLIN HFA) 108 (90 Base) MCG/ACT inhaler  Every 6 hours PRN        12/14/20 1859    ipratropium-albuterol (DUONEB) 0.5-2.5 (3) MG/3ML SOLN  Every 6 hours PRN        12/14/20 1917             Sherrell Puller, PA-C 12/14/20 [place], [place], DO 12/14/20 2308

## 2021-01-13 ENCOUNTER — Encounter: Payer: Self-pay | Admitting: Family

## 2021-01-19 ENCOUNTER — Encounter (HOSPITAL_COMMUNITY): Payer: Self-pay | Admitting: Emergency Medicine

## 2021-01-19 ENCOUNTER — Other Ambulatory Visit: Payer: Self-pay

## 2021-01-19 ENCOUNTER — Emergency Department (HOSPITAL_COMMUNITY)
Admission: EM | Admit: 2021-01-19 | Discharge: 2021-01-19 | Disposition: A | Discharge status: Home / Self Care | Payer: Managed Care, Other (non HMO) | Attending: Student | Admitting: Student

## 2021-01-19 DIAGNOSIS — R55 Syncope and collapse: Secondary | ICD-10-CM | POA: Insufficient documentation

## 2021-01-19 DIAGNOSIS — Z5321 Procedure and treatment not carried out due to patient leaving prior to being seen by health care provider: Secondary | ICD-10-CM | POA: Insufficient documentation

## 2021-01-19 DIAGNOSIS — R110 Nausea: Secondary | ICD-10-CM | POA: Insufficient documentation

## 2021-01-19 DIAGNOSIS — R42 Dizziness and giddiness: Secondary | ICD-10-CM | POA: Diagnosis not present

## 2021-01-19 LAB — COMPREHENSIVE METABOLIC PANEL
ALT: 29 U/L (ref 0–44)
AST: 32 U/L (ref 15–41)
Albumin: 3.8 g/dL (ref 3.5–5.0)
Alkaline Phosphatase: 85 U/L (ref 38–126)
Anion gap: 12 (ref 5–15)
BUN: 19 mg/dL (ref 6–20)
CO2: 25 mmol/L (ref 22–32)
Calcium: 8.8 mg/dL — ABNORMAL LOW (ref 8.9–10.3)
Chloride: 101 mmol/L (ref 98–111)
Creatinine, Ser: 0.81 mg/dL (ref 0.44–1.00)
GFR, Estimated: >60 mL/min (ref >60)
Glucose, Bld: 200 mg/dL — ABNORMAL HIGH (ref 70–99)
Potassium: 3.3 mmol/L — ABNORMAL LOW (ref 3.5–5.1)
Sodium: 138 mmol/L (ref 135–145)
Total Bilirubin: 0.4 mg/dL (ref 0.3–1.2)
Total Protein: 6.9 g/dL (ref 6.5–8.1)

## 2021-01-19 LAB — LIPASE, BLOOD: Lipase: 31 U/L (ref 11–51)

## 2021-01-19 LAB — CBC
HCT: 40.8 % (ref 36.0–46.0)
Hemoglobin: 13.8 g/dL (ref 12.0–15.0)
MCH: 30.7 pg (ref 26.0–34.0)
MCHC: 33.8 g/dL (ref 30.0–36.0)
MCV: 90.9 fL (ref 80.0–100.0)
Platelets: 489 10*3/uL — ABNORMAL HIGH (ref 150–400)
RBC: 4.49 MIL/uL (ref 3.87–5.11)
RDW: 13.6 % (ref 11.5–15.5)
WBC: 11.2 10*3/uL — ABNORMAL HIGH (ref 4.0–10.5)
nRBC: 0 % (ref 0.0–0.2)

## 2021-01-19 NOTE — ED Triage Notes (Signed)
Also having some nausea with cramping.

## 2021-01-19 NOTE — ED Provider Notes (Addendum)
Emergency Medicine Provider Triage Evaluation Note  Wanda Williams , a 50 y.o. female  was evaluated in triage.  Pt complains of syncope. Patient was with her dog and noted blood coming from its ear, she got lightheaded and sat down on the ground, she subsequently passed out. Came back to feeling overheated & nauseous. When she stood back up she felt lightheaded again. She feels somewhat better now. Has passed out once previously.   Review of Systems  Positive: Syncope, lightheaded, nauseous Negative: Chest pain, dyspnea, vomiting, diarrhea.   Physical Exam  BP 111/78   Pulse 93   Temp (!) 97.4 F (36.3 C)   Resp 18   LMP 04/20/2016 (LMP Unknown) Comment: BTL  SpO2 96%  Gen:   Awake, no distress   Resp:  Normal effort  MSK:   Moves extremities without difficulty  Other:  PERRL. EOMI. Strength & sensation grossyl intact x 4. Abdomen nontender w/o peritoneal signs.   Medical Decision Making  Medically screening exam initiated at 12:59 AM.  Appropriate orders placed.  Wanda Williams was informed that the remainder of the evaluation will be completed by another provider, this initial triage assessment does not replace that evaluation, and the importance of remaining in the ED until their evaluation is complete.  Syncope.    Wanda Dyke, PA-C 01/19/21 0105    [name] 01/19/21 0116    Palumbo, April, MD 01/19/21 [phone]

## 2021-01-19 NOTE — ED Notes (Signed)
Patient states she is going to try a different facility

## 2021-01-19 NOTE — ED Triage Notes (Signed)
Patient here after having a near syncopal episode after seeing blood from her dog ear.  Patient did get lightheaded, cold sweat, passed out and does not remember the incident.

## 2021-04-14 ENCOUNTER — Other Ambulatory Visit: Payer: Self-pay | Admitting: Family

## 2021-04-14 DIAGNOSIS — Z1231 Encounter for screening mammogram for malignant neoplasm of breast: Secondary | ICD-10-CM

## 2021-04-28 ENCOUNTER — Ambulatory Visit
Admission: RE | Admit: 2021-04-28 | Discharge: 2021-04-28 | Disposition: A | Discharge status: Home / Self Care | Payer: Managed Care, Other (non HMO) | Source: Ambulatory Visit | Attending: Family | Admitting: Family

## 2021-04-28 DIAGNOSIS — Z1231 Encounter for screening mammogram for malignant neoplasm of breast: Secondary | ICD-10-CM

## 2021-05-12 ENCOUNTER — Ambulatory Visit (INDEPENDENT_AMBULATORY_CARE_PROVIDER_SITE_OTHER): Payer: Managed Care, Other (non HMO) | Admitting: Family

## 2021-05-12 ENCOUNTER — Encounter: Payer: Self-pay | Admitting: Family

## 2021-05-12 VITALS — BP 130/76 | HR 84 | Temp 98.9°F | Resp 16 | Ht <= 58 in | Wt 204.0 lb

## 2021-05-12 DIAGNOSIS — E781 Pure hyperglyceridemia: Secondary | ICD-10-CM

## 2021-05-12 DIAGNOSIS — R8290 Unspecified abnormal findings in urine: Secondary | ICD-10-CM

## 2021-05-12 DIAGNOSIS — K1370 Unspecified lesions of oral mucosa: Secondary | ICD-10-CM | POA: Insufficient documentation

## 2021-05-12 DIAGNOSIS — G4733 Obstructive sleep apnea (adult) (pediatric): Secondary | ICD-10-CM

## 2021-05-12 DIAGNOSIS — I10 Essential (primary) hypertension: Secondary | ICD-10-CM

## 2021-05-12 DIAGNOSIS — M6530 Trigger finger, unspecified finger: Secondary | ICD-10-CM | POA: Insufficient documentation

## 2021-05-12 DIAGNOSIS — Z0000 Encounter for general adult medical examination without abnormal findings: Secondary | ICD-10-CM

## 2021-05-12 DIAGNOSIS — G5603 Carpal tunnel syndrome, bilateral upper limbs: Secondary | ICD-10-CM

## 2021-05-12 DIAGNOSIS — Z808 Family history of malignant neoplasm of other organs or systems: Secondary | ICD-10-CM | POA: Insufficient documentation

## 2021-05-12 DIAGNOSIS — F418 Other specified anxiety disorders: Secondary | ICD-10-CM

## 2021-05-12 LAB — COMPREHENSIVE METABOLIC PANEL
ALT: 21 U/L (ref 0–35)
AST: 19 U/L (ref 0–37)
Albumin: 4.1 g/dL (ref 3.5–5.2)
Alkaline Phosphatase: 97 U/L (ref 39–117)
BUN: 20 mg/dL (ref 6–23)
CO2: 33 mEq/L — ABNORMAL HIGH (ref 19–32)
Calcium: 9.1 mg/dL (ref 8.4–10.5)
Chloride: 103 mEq/L (ref 96–112)
Creatinine, Ser: 0.53 mg/dL (ref 0.40–1.20)
GFR: 105.8 mL/min (ref >60.00)
Glucose, Bld: 92 mg/dL (ref 70–99)
Potassium: 4.4 mEq/L (ref 3.5–5.1)
Sodium: 142 mEq/L (ref 135–145)
Total Bilirubin: 0.2 mg/dL (ref 0.2–1.2)
Total Protein: 6.7 g/dL (ref 6.0–8.3)

## 2021-05-12 LAB — LIPID PANEL
Cholesterol: 196 mg/dL (ref 0–200)
HDL: 42.9 mg/dL (ref >39.00)
NonHDL: 152.79
Total CHOL/HDL Ratio: 5
Triglycerides: 285 mg/dL — ABNORMAL HIGH (ref 0.0–149.0)
VLDL: 57 mg/dL — ABNORMAL HIGH (ref 0.0–40.0)

## 2021-05-12 LAB — LDL CHOLESTEROL, DIRECT: Direct LDL: 107 mg/dL

## 2021-05-12 NOTE — Assessment & Plan Note (Signed)
Stable on current meds.  Continue same. 

## 2021-05-12 NOTE — Assessment & Plan Note (Signed)
Refer to dermatology for annual skin checks. ?

## 2021-05-12 NOTE — Assessment & Plan Note (Signed)
Wt Readings from Last 3 Encounters:  ?05/12/21 204 lb (92.5 kg)  ?12/14/20 207 lb (93.9 kg)  ?11/03/20 207 lb (93.9 kg)  ? ?Discussed diet/exercise/weight loss.  Mammo up to date.  Colo up to date.  ?

## 2021-05-12 NOTE — Assessment & Plan Note (Signed)
BP Readings from Last 3 Encounters:  ?05/12/21 130/76  ?01/19/21 124/89  ?12/14/20 125/85  ? ?BP at goal on amlodipine, diovan-hct.   ?

## 2021-05-12 NOTE — Assessment & Plan Note (Signed)
Uncontrolled. Will refer to sports medicine. ?

## 2021-05-12 NOTE — Assessment & Plan Note (Signed)
Multiple fingers affected. Refer to sports medicine. ?

## 2021-05-12 NOTE — Progress Notes (Signed)
Subjective:   By signing my name below, I, Shehryar Baig, attest that this documentation has been prepared under the direction and in the presence of [name], NP. 05/12/2021    Patient ID: Wanda Williams, female    DOB: [birth year], 51 y.o.   MRN: [id number]  Chief Complaint  Patient presents with   Annual Exam         HPI Patient is in today for a comprehensive physical exam.   Blood pressure- Her blood pressure is doing well during this visit. She continues taking 160-25 mg Diovan-HCT daily PO, 2.5 mg amlodipine daily PO and reports no new issues while taking them. She continues having ankle swelling. She is wearing compression socks but finds her swelling continues. She is typically on her feet most of the day while working.  BP Readings from Last 3 Encounters:  05/12/21 130/76  01/19/21 124/89  12/14/20 125/85   Pulse Readings from Last 3 Encounters:  05/12/21 84  01/19/21 95  12/14/20 85   Cardiology- She has not seen her cardiologist specialist in a while. She continues wearing a CPAP machine but was not told when to follow up with her cardiologist after starting it.  Mood- She continues taking 150 mg Effexor-XR daily PO, 150 mg Wellbutrin-XL daily PO and reports no new issues while taking them. She reports doing well while taking them.  Duoneb- She does not taking duoneb inhaler since she recovered from pneumonia.  Carpal tunnel- She is interested in seeing a specialist to manage her carpal tunnel in both hands. She reports starting to develop trigger finger since last year. She has increased pain while flexing her fingers.  Vaginal odor- She continues having vaginal odor. She has occasional frequency as well. She reports while being treated from it previously her symptoms improved but they shortly returned after her medication course concluded. She denies having any vaginal discharge.   She denies having any fever, new muscle pain, joint pain, new moles,  congestion, sinus pain, sore throat, chest pain, palpations, cough, SOB, wheezing, n/v/d, constipation, blood in stool, dysuria, frequency, hematuria, depression, anxiety, headaches at this time. Social history: She has no recent surgical procedures this past year. She has no recent changes to her family medical history. Her brother has a history of melanoma. She does not see a dermatologist at this time and is willing to start seeing on regularly.  Immunizations: She received a flu vaccine this year. She is UTD on tetanus vaccines. She has 2 pfizer Covid-19 vaccines. She is UTD on shingles vaccines.  Diet: She is managing a healthy diet. She reports cutting out sweets would improve her diet.  Exercise: She is maintaining exercise while at  work but not at home.  Wt Readings from Last 3 Encounters:  05/12/21 204 lb (92.5 kg)  12/14/20 207 lb (93.9 kg)  11/03/20 207 lb (93.9 kg)   Colonoscopy: Last completed 01/14/2019. Results showed: -Mild left colonic diverticulosis. -Minimal internal hemorrhoids. -Otherwise normal colonoscopy to TI. The colon was somewhat redundant. Otherwise results are normal. Repeat in 10 years.  Pap Smear: Last completed 05/13/2020. Results are normal. Repeat in 3 years.  Mammogram: Last completed 04/28/2021. Results are normal. Repeat in 1 year.  Dental: She has an [facility] appointment this year. She reports biting the inside of her cheek and developing a bump. Her dentist recommend she remove it but she declined due to the procedure being too expensive.  Vision: She is UTD on vision care. She is trying  to receive new glasses.   Health Maintenance Due  Topic Date Due   COVID-19 Vaccine (3 - Pfizer risk series) 06/29/2019    Past Medical History:  Diagnosis Date   Allergy    Angio-edema    Anxiety    Depression    Eczema    HTN (hypertension)    Sleep apnea    uses CPAP nightly    Past Surgical History:  Procedure Laterality Date   BREAST LUMPECTOMY  WITH RADIOACTIVE SEED LOCALIZATION Left 11/27/2018   Procedure: RADIOACTIVE SEED GUIDED LEFT BREAST LUMPECTOMY;  Surgeon: Erroll Luna, MD;  Location: [facility];  Service: General;  Laterality: Left;   CESAREAN SECTION     x 2   DILATION AND CURETTAGE OF UTERUS     THYROID CYST EXCISION     TUBAL LIGATION  2006    Family History  Problem Relation Age of Onset   Heart disease Father        heart murmur   Parkinson's disease Father    Hypertension Mother    Coronary artery disease Paternal Uncle        2 uncles (both died before 80)   Coronary artery disease Paternal Aunt        MI, died before 11   Melanoma Brother 22       melanoma   Allergic rhinitis Sister    Colon cancer Neg Hx    Rectal cancer Neg Hx    Stomach cancer Neg Hx    Esophageal cancer Neg Hx     Social History   Socioeconomic History   Marital status: Married    Spouse name: Not on file   Number of children: Not on file   Years of education: Not on file   Highest education level: Not on file  Occupational History   Not on file  Tobacco Use   Smoking status: Never   Smokeless tobacco: Never  Vaping Use   Vaping Use: Never used  Substance and Sexual Activity   Alcohol use: Yes    Alcohol/week: 0.0 standard drinks    Comment: occasional wine   Drug use: No   Sexual activity: Yes    Birth control/protection: Surgical, Post-menopausal    Comment: BTL  Other Topics Concern   Not on file  Social History Narrative   Works at [employer]   One daughter age 80   One daughter is age 46   Enjoys cross stitching, playing games on her cell phone.     2 dogs   Completed 12th grade.    Social Determinants of Health   Financial Resource Strain: Not on file  Food Insecurity: Not on file  Transportation Needs: Not on file  Physical Activity: Not on file  Stress: Not on file  Social Connections: Not on file  Intimate Partner Violence: Not on file    Outpatient Medications Prior to  Visit  Medication Sig Dispense Refill   amLODipine (NORVASC) 2.5 MG tablet Take 1 tablet (2.5 mg total) by mouth daily. 90 tablet 1   aspirin-acetaminophen-caffeine (EXCEDRIN MIGRAINE) 250-250-65 MG tablet Take 1-2 tablets by mouth every 6 (six) hours as needed for headache or migraine.      buPROPion (WELLBUTRIN XL) 150 MG 24 hr tablet Take 1 tablet (150 mg total) by mouth daily. 90 tablet 1   ipratropium-albuterol (DUONEB) 0.5-2.5 (3) MG/3ML SOLN Take 3 mLs by nebulization every 6 (six) hours as needed. 180 mL 0   Multiple Vitamins-Minerals (MULTIVITAMIN  WITH MINERALS) tablet Take 1 tablet by mouth at bedtime.      triamcinolone (KENALOG) 0.025 % ointment Apply 1 application topically as needed.      valsartan-hydrochlorothiazide (DIOVAN-HCT) 160-25 MG tablet Take 1 tablet by mouth daily. 90 tablet 1   venlafaxine XR (EFFEXOR-XR) 150 MG 24 hr capsule TAKE ONE CAPSULE BY MOUTH DAILY WITH BREAKFAST 90 capsule 1   albuterol (VENTOLIN HFA) 108 (90 Base) MCG/ACT inhaler Inhale 1-2 puffs into the lungs every 6 (six) hours as needed for wheezing or shortness of breath. 1 each 0   doxycycline (VIBRAMYCIN) 100 MG capsule Take 1 capsule (100 mg total) by mouth 2 (two) times daily. 20 capsule 0   meloxicam (MOBIC) 7.5 MG tablet Take 7.5 mg by mouth once a day (Patient taking differently: at bedtime. Take 7.5 mg by mouth once a day) 30 tablet 3   No facility-administered medications prior to visit.    Allergies  Allergen Reactions   Ace Inhibitors Cough    Review of Systems  Constitutional:  Negative for fever.       (-)unexpected weight change (-)Adenopathy  HENT:  Negative for congestion, hearing loss, sinus pain and sore throat.        (-)Rhinorrhea   Eyes:        (-)Visual disturbance  Respiratory:  Negative for cough, shortness of breath and wheezing.   Cardiovascular:  Negative for chest pain, palpitations and leg swelling.  Gastrointestinal:  Negative for blood in stool, constipation,  diarrhea, nausea and vomiting.  Genitourinary:  Negative for dysuria and frequency.  Musculoskeletal:  Negative for joint pain and myalgias.  Skin:        (-)new moles  Neurological:  Negative for headaches.  Psychiatric/Behavioral:  Negative for depression. The patient is not nervous/anxious.       Objective:    Physical Exam Constitutional:      General: She is not in acute distress.    Appearance: Normal appearance. She is not ill-appearing.  HENT:     Head: Normocephalic and atraumatic.     Right Ear: Tympanic membrane, ear canal and external ear normal.     Left Ear: Tympanic membrane, ear canal and external ear normal.     Mouth/Throat:     Comments: Raised lesion inside left cheek Eyes:     Extraocular Movements: Extraocular movements intact.     Right eye: No nystagmus.     Left eye: No nystagmus.     Pupils: Pupils are equal, round, and reactive to light.  Neck:     Thyroid: No thyroid tenderness.  Cardiovascular:     Rate and Rhythm: Normal rate and regular rhythm.     Heart sounds: Normal heart sounds. No murmur heard.   No gallop.  Pulmonary:     Effort: Pulmonary effort is normal. No respiratory distress.     Breath sounds: Normal breath sounds. No wheezing or rales.  Abdominal:     General: There is no distension.     Palpations: Abdomen is soft.     Tenderness: There is no abdominal tenderness. There is no guarding.  Musculoskeletal:     Comments: 5/5 strength in both upper and lower extremities  Lymphadenopathy:     Cervical: No cervical adenopathy.  Skin:    General: Skin is warm and dry.  Neurological:     Mental Status: She is alert and oriented to person, place, and time.     Deep Tendon Reflexes:     Reflex  Scores:      Patellar reflexes are 3+ on the right side and 3+ on the left side. Psychiatric:        Judgment: Judgment normal.    BP 130/76 (BP Location: Right Arm, Patient Position: Sitting, Cuff Size: Large)    Pulse 84    Temp 98.9 F  (37.2 C) (Oral)    Resp 16    Ht 4' 10"  (1.473 m)    Wt 204 lb (92.5 kg)    LMP 04/20/2016 (LMP Unknown) Comment: BTL   SpO2 99%    BMI 42.64 kg/m  Wt Readings from Last 3 Encounters:  05/12/21 204 lb (92.5 kg)  12/14/20 207 lb (93.9 kg)  11/03/20 207 lb (93.9 kg)       Assessment & Plan:   Problem List Items Addressed This Visit       Unprioritized   Trigger finger    Multiple fingers affected. Refer to sports medicine.      Relevant Orders   Ambulatory referral to Sports Medicine   Preventative health care    Wt Readings from Last 3 Encounters:  05/12/21 204 lb (92.5 kg)  12/14/20 207 lb (93.9 kg)  11/03/20 207 lb (93.9 kg)  Discussed diet/exercise/weight loss.  Mammo up to date.  Colo up to date.       Mouth lesion   Relevant Orders   Ambulatory referral to ENT   Family history of melanoma    Refer to dermatology for annual skin checks.      Relevant Orders   Ambulatory referral to Dermatology   Essential hypertension    BP Readings from Last 3 Encounters:  05/12/21 130/76  01/19/21 124/89  12/14/20 125/85  BP at goal on amlodipine, diovan-hct.        Depression with anxiety    Stable on current meds.  Continue same.       Carpal tunnel syndrome    Uncontrolled. Will refer to sports medicine.      Relevant Orders   Ambulatory referral to Sports Medicine   Other Visit Diagnoses     Primary hypertension    -  Primary   Relevant Orders   Ambulatory referral to Cardiology   Abnormal urine odor       Relevant Orders   Urine Culture   OSA (obstructive sleep apnea)       Relevant Orders   Comp Met (CMET)   Hypertriglyceridemia       Relevant Orders   Lipid panel        No orders of the defined types were placed in this encounter.   I, [name], NP, personally preformed the services described in this documentation.  All medical record entries made by the scribe were at my direction and in my presence.  I have reviewed the chart  and discharge instructions (if applicable) and agree that the record reflects my personal performance and is accurate and complete. 05/12/2021   I,Shehryar Baig,acting as a scribe for [name], NP.,have documented all relevant documentation on the behalf of [name], NP,as directed by  [name], NP while in the presence of [name], NP.   [name], NP

## 2021-05-13 MED ORDER — VALSARTAN-HYDROCHLOROTHIAZIDE 160-25 MG PO TABS: 1.0000 | ORAL_TABLET | Freq: Every day | ORAL | 1 refills | Status: DC

## 2021-05-13 MED ORDER — VENLAFAXINE HCL ER 150 MG PO CP24: ORAL_CAPSULE | ORAL | 1 refills | Status: DC

## 2021-05-13 MED ORDER — AMLODIPINE BESYLATE 2.5 MG PO TABS: 2.5000 mg | ORAL_TABLET | Freq: Every day | ORAL | 1 refills | Status: DC

## 2021-05-13 MED ORDER — BUPROPION HCL ER (XL) 150 MG PO TB24: 150.0000 mg | ORAL_TABLET | Freq: Every day | ORAL | 1 refills | Status: DC

## 2021-05-14 LAB — URINE CULTURE
MICRO NUMBER:: 13104003
SPECIMEN QUALITY:: ADEQUATE

## 2021-05-16 ENCOUNTER — Telehealth: Payer: Self-pay | Admitting: Family

## 2021-05-16 MED ORDER — CEPHALEXIN 500 MG PO CAPS: 500.0000 mg | ORAL_CAPSULE | Freq: Four times a day (QID) | ORAL | 0 refills | Status: AC

## 2021-05-16 NOTE — Telephone Encounter (Signed)
See mychart.  

## 2021-05-19 ENCOUNTER — Ambulatory Visit (INDEPENDENT_AMBULATORY_CARE_PROVIDER_SITE_OTHER): Payer: Managed Care, Other (non HMO) | Admitting: Sports Medicine

## 2021-05-19 ENCOUNTER — Ambulatory Visit: Payer: Self-pay

## 2021-05-19 VITALS — BP 157/93 | Ht <= 58 in | Wt 204.0 lb

## 2021-05-19 DIAGNOSIS — G5603 Carpal tunnel syndrome, bilateral upper limbs: Secondary | ICD-10-CM

## 2021-05-19 DIAGNOSIS — M65321 Trigger finger, right index finger: Secondary | ICD-10-CM | POA: Diagnosis not present

## 2021-05-19 MED ORDER — PREDNISONE 10 MG (21) PO TBPK: ORAL_TABLET | ORAL | 0 refills | Status: DC

## 2021-05-19 NOTE — Patient Instructions (Signed)
It was great to meet you today, thank you for letting me participate in your care! ? ?Today, we discussed your bilateral wrist pain due to carpal tunnel and your trigger finger. ? ?Things to do for this: ?-Complete your 6-day prednisone medication course, see below for details ?-Wear your wrist braces every night ?-Ice the wrist and hands 2-3 times a day ? ?You will follow-up with me in about 3-4 weeks. ? ?Patient instructions for Prednisone taper: ?  ?Day 1: Take 2 tablets before breakfast, take 1 tablet after lunch, take 1 tablet after dinner, take 2 tablets at bedtime. ?Day 2: Take 1 tablet before breakfast, take 1 tablet after lunch, take 1 tablet after dinner, take 2 tablets at bedtime. ?Day 3: Take 1 tablet before breakfast, take 1 tablet after lunch, take 1 tablet after dinner, take 1 tablet at bedtime. ?Day 4: Take 1 tablet before breakfast, take 1 tablet after lunch, take 1 tablet at bedtime. ?Day 5: Take 1 tablet before breakfast,  take 1 tablet at bedtime. ?Day 6: Take 1 tablet before breakfast ? ?If you have any further questions, please give the clinic a call [phone]. ? ?Cheers, ? ?Elba Barman, DO ?[place] ? ?

## 2021-05-19 NOTE — Progress Notes (Signed)
PCP: [name], NP ? ?Subjective:  ? ?HPI: ?Patient is a 51 y.o. female here for evaluation of bilateral wrist and hand pain. ? ?Bilateral wrist/hand pain - Lyrical states that she has had some numbness and tingling and aching pain in the volar aspect of her wrists and fingers of bilateral hands for about the last 2 years.  She denies any inciting event or injury.  This has progressively getting worse.  She is right-hand dominant and the right is slightly worse than the left.  She denies any redness; she does note some puffiness in bilateral hands that she has had for years.  She had seen her primary physician for this and thought she had carpal tunnel.  At one point she did have a hand glove/brace that she used very occasionally, although nothing consistent.  She is not taking any medications for this.  When questioned, she states the  tingling sensation is in digits one through 4.  She denies any loss of grip strength.  Her symptoms are worse with fine motor movements. She will have to "shake out" her hands and wrist when her symptoms worsen, more so specifically at night or in the morning. ? ?Trigger finger, right 4th - Patient states over the last few months she has had triggering episodes of the right index finger.  She states occasionally she may have these have been of her fifth finger, although very infrequent.  The pain is worse in the morning, sometimes she will have to pry the finger open, although pain waxes and wanes.  She is not doing anything for this pharmacologically. ? ?She denies any personal or family history of rheumatoid arthritis, psoriatic arthritis or other rheumatologic conditions. ? ?Past Medical History:  ?Diagnosis Date  ? Allergy   ? Angio-edema   ? Anxiety   ? Depression   ? Eczema   ? HTN (hypertension)   ? Sleep apnea   ? uses CPAP nightly  ? ? ?Current Outpatient Medications on File Prior to Visit  ?Medication Sig Dispense Refill  ? amLODipine (NORVASC) 2.5 MG tablet Take 1  tablet (2.5 mg total) by mouth daily. 90 tablet 1  ? aspirin-acetaminophen-caffeine (EXCEDRIN MIGRAINE) 250-250-65 MG tablet Take 1-2 tablets by mouth every 6 (six) hours as needed for headache or migraine.     ? buPROPion (WELLBUTRIN XL) 150 MG 24 hr tablet Take 1 tablet (150 mg total) by mouth daily. 90 tablet 1  ? cephALEXin (KEFLEX) 500 MG capsule Take 1 capsule (500 mg total) by mouth 4 (four) times daily for 5 days. 20 capsule 0  ? ipratropium-albuterol (DUONEB) 0.5-2.5 (3) MG/3ML SOLN Take 3 mLs by nebulization every 6 (six) hours as needed. 180 mL 0  ? Multiple Vitamins-Minerals (MULTIVITAMIN WITH MINERALS) tablet Take 1 tablet by mouth at bedtime.     ? triamcinolone (KENALOG) 0.025 % ointment Apply 1 application topically as needed.     ? valsartan-hydrochlorothiazide (DIOVAN-HCT) 160-25 MG tablet Take 1 tablet by mouth daily. 90 tablet 1  ? venlafaxine XR (EFFEXOR-XR) 150 MG 24 hr capsule TAKE ONE CAPSULE BY MOUTH DAILY WITH BREAKFAST 90 capsule 1  ? ?No current facility-administered medications on file prior to visit.  ? ? ?Past Surgical History:  ?Procedure Laterality Date  ? BREAST LUMPECTOMY WITH RADIOACTIVE SEED LOCALIZATION Left 11/27/2018  ? Procedure: RADIOACTIVE SEED GUIDED LEFT BREAST LUMPECTOMY;  Surgeon: Erroll Luna, MD;  Location: [facility];  Service: General;  Laterality: Left;  ? CESAREAN SECTION    ?  x 2  ? DILATION AND CURETTAGE OF UTERUS    ? THYROID CYST EXCISION    ? TUBAL LIGATION  2006  ? ? ?Allergies  ?Allergen Reactions  ? Ace Inhibitors Cough  ? ? ?BP (!) 157/93   Ht '4\' 10"'$  (1.473 m)   Wt 204 lb (92.5 kg)   LMP 04/20/2016 (LMP Unknown) Comment: BTL  BMI 42.64 kg/m?  ? ?[place] Adult Exercise 05/19/2021  ?Frequency of aerobic exercise (# of days/week) 2  ?Average time in minutes 5  ?Frequency of strengthening activities (# of days/week) 7  ? ? ?No flowsheet data found. ? ?    ?Objective:  ?Physical Exam: ? ?Gen: Well-appearing, in no acute  distress; non-toxic ?CV: Regular Rate. Well-perfused. Warm.  ?Resp: Breathing unlabored on room air; no wheezing. ?Psych: Fluid speech in conversation; appropriate affect; normal thought process ?Neuro: Sensation intact throughout. No gross coordination deficits.  ?MSK:  ?- Bilateral wrists and hands: Visual inspection demonstrates no erythema, ecchymosis or effusion.  No significant TTP throughout the wrist.  There is some soft tissue swelling of all 5 digits bilaterally. There is full range of motion about bilateral wrists.  The fourth right finger does have triggering episodes with closed fist flexion. + Tinel's at the carpal tunnel bilaterally.  She does have reproduction of symptoms with Phalen's and reverse Phalen's testing.  Neurovascular intact distally. Negative Tinel's at cubital tunnel, guyon's canal. ? ?+ 4th right trigger finger triggering active and passively ? ?MSK Limited wrist/CTS ultrasound performed, bilateral ? ?- Evaluation of the right carpal tunnel was identified in the transverse plane, FCR tendon intact without notable pathology ?- Evaluation of the median nerve on the right demonstrated some loss of the normal echogenicity of the median nerve. Area = 0.12cm^2. There is some mild thickening of the overlying flexor retinaculum. ?- Evaluation of the left carpal tunnel was identified in the transverse plane, FCR tendon intact without notable pathology.  There is no joint effusion noted. ?- Evaluation of the left median nerve demonstrates normal appearing echotexture and honeycomb appearance. Area = 0.10-0.11cm^2 ?  ?IMPRESSION: Findings suggestive of carpal tunnel syndrome, right greater than left with some loss of normal echotexture on the right. ? ?  ?Assessment & Plan:  ?1. Carpal tunnel syndrome, bilateral ?2. Hand pain, bilateral ?3. Trigger finger, right 4th finger ? ?- We discussed all treatment options for carpal tunnel, and elected with conservative therapy and oral prednisone at this  time  ?- 6 day, 10 mg prednisone taper ?- Bilateral wrist cockup braces to be worn nightly ?- She may ice the hands or wrists And trigger finger to help reduce pain or inflammation ?- Discussed stretches to help open up the carpal tunnel inlet she may do at home ?- We will follow-up in about 4 weeks for reevaluation. At that visit, she still has aggravation of her symptoms, we may proceed with injection therapy either for the trigger finger and/or carpal tunnel symptoms ? ?Elba Barman, DO ?PGY-4, Sports Medicine Fellow ?[place] ? ?Addendum:  I was the preceptor for this visit and available for immediate consultation.  Karlton Lemon MD CAQSM ? ?

## 2021-06-16 ENCOUNTER — Ambulatory Visit: Payer: Self-pay

## 2021-06-16 ENCOUNTER — Ambulatory Visit (INDEPENDENT_AMBULATORY_CARE_PROVIDER_SITE_OTHER): Payer: Managed Care, Other (non HMO) | Admitting: Sports Medicine

## 2021-06-16 VITALS — BP 142/90 | Ht <= 58 in | Wt 202.0 lb

## 2021-06-16 DIAGNOSIS — G5602 Carpal tunnel syndrome, left upper limb: Secondary | ICD-10-CM

## 2021-06-16 DIAGNOSIS — M6530 Trigger finger, unspecified finger: Secondary | ICD-10-CM

## 2021-06-16 DIAGNOSIS — G5601 Carpal tunnel syndrome, right upper limb: Secondary | ICD-10-CM | POA: Diagnosis not present

## 2021-06-16 MED ORDER — METHYLPREDNISOLONE ACETATE 40 MG/ML IJ SUSP
40.0000 mg | Freq: Once | INTRAMUSCULAR | Status: AC
  Administered 2021-06-16: 40 mg via INTRA_ARTICULAR

## 2021-06-16 MED ORDER — GABAPENTIN 100 MG PO CAPS: 100.0000 mg | ORAL_CAPSULE | Freq: Three times a day (TID) | ORAL | 2 refills | Status: DC

## 2021-06-16 MED ORDER — VITAMIN B-6 100 MG PO TABS: 100.0000 mg | ORAL_TABLET | Freq: Every day | ORAL | 1 refills | Status: DC

## 2021-06-16 NOTE — Progress Notes (Addendum)
PCP: [name], NP ? ?Subjective:  ? ?HPI: ?Wanda Williams is a pleasant 51 y.o. female here for follow-up of bilateral carpal tunnel syndrome and trigger finger. ? ?Bilateral carpal tunnel - patient continues with numbness and tingling in the volar wrist that extends into her 1-4 fingers. She did note some improvement with the prednisone pack and with cock-up wrist braces. She is wearing the braces at night, but unable to wear them during the day with activity. She is RHD, but symptoms are about the same on the right and left hand. Has not had to "shake out" the hands as much at night with bracing, but still gets some daytime symptoms.  ? ?Trigger finger, right 4th finger - had been having triggering episodes over the past few months. Recently after the prednisone and bracing her triggering has been happening much less frequently. She is pleased with the improvement. Pain is minimal. No triggering this AM. ? ?Past Medical History:  ?Diagnosis Date  ? Allergy   ? Angio-edema   ? Anxiety   ? Depression   ? Eczema   ? HTN (hypertension)   ? Sleep apnea   ? uses CPAP nightly  ? ? ? ?BP (!) 142/90   Ht 4' 9.5" (1.461 m)   Wt 202 lb (91.6 kg)   LMP 04/20/2016 (LMP Unknown) Comment: BTL  BMI 42.96 kg/m?  ? ? ?  05/19/2021  ? 10:01 AM 06/16/2021  ?  9:54 AM  ?[place] Adult Exercise  ?Frequency of aerobic exercise (# of days/week) 2 2  ?Average time in minutes 5 5  ?Frequency of strengthening activities (# of days/week) 7 7  ? ? ?   ? View : No data to display.  ?  ?  ?  ? ? ?    ?Objective:  ?Physical Exam: ? ?Gen: Well-appearing, in no acute distress; non-toxic ?CV: Regular Rate. Well-perfused. Warm.  ?Resp: Breathing unlabored on room air; no wheezing. ?Psych: Fluid speech in conversation; appropriate affect; normal thought process ?Neuro: Sensation intact throughout. No gross coordination deficits.  ?MSK:  ? - Bilateral wrists and fingers: visual inspection demonstrates no erythema, ecchymosis or  effusion.  There is full range of motion about bilateral wrists.  The fourth MCP and PIP move freely without triggering noted.  Mildly positive Tinel's sign at the left carpal tunnel, negative on the right.  Negative Tinel's at the cubital tunnel, Guyon's canal.  Sensation intact in the volar and dorsal aspect of the hand.  Neurovascular intact distally. ? ? ?MSK Limited Ultrasound carpal tunnel, left wrist: ?- Transverse, short-axis view of the carpal tunnel inlet was demonstrated ?- Limited evaluation of median nerve was visualized in static and dynamic views, diving deep when scanning proximally. Median nerve demonstrates intact honeycomb appearance with intact echogenicity.  ?- FCR tendon intact without pathology ?- Radial artery and ulnar artery identified via doppler ultrasound and avoided during injection ? ?  ?Assessment & Plan:  ?1. Carpal tunnel syndrome, bilateral - left CTS injected today ?2. Trigger finger, right 4th finger - improving ? ?Procedure: US-guided Carpal Tunnel Injection, Left Wrist ?After informed written consent and discussion on R/B/I, a timeout was performed, patient was seated on exam table. Area overlying the patient's Left carpal tunnel prepped with Betadine and alcohol swab then utilizing ultrasound guidance, patient's left carpal tunnel injected with 3:1 lidocaine: depomedrol with hydrodissection of median nerve from the flexor retinaculum in 360 degree fashion. Patient tolerated procedure well without immediate complications. ? ?Plan: ?-Through shared  decision making, elected to proceed with ultrasound-guided left carpal tunnel injection, patient tolerated well ?-Patient to continue wearing bilateral cock-up wrist braces at nighttime ?-We will do trial of vitamin B6 100 mg daily, gabapentin 100 mg 3 times daily, may increase to 200 mg at night if she tolerates over the next 1-2 weeks ?-Continue her stretches, and ice and anti-inflammatories as able ?-We will see her back in about 3  weeks to reevaluate how she is doing status postinjection, if she finds benefit we can consider injecting the right side. ? ?Elba Barman, DO ?PGY-4, Sports Medicine Fellow ?[place] ? ?This note was dictated using Dragon naturally speaking software and may contain errors in syntax, spelling, or content which have not been identified prior to signing this note.  ? ?I was the preceptor for this visit and available for immediate consultation ?Shellia Cleverly, DO ? ? ?

## 2021-07-09 ENCOUNTER — Ambulatory Visit: Payer: Self-pay

## 2021-07-09 ENCOUNTER — Ambulatory Visit (INDEPENDENT_AMBULATORY_CARE_PROVIDER_SITE_OTHER): Payer: Managed Care, Other (non HMO) | Admitting: Sports Medicine

## 2021-07-09 VITALS — BP 124/88 | Ht <= 58 in | Wt 200.0 lb

## 2021-07-09 DIAGNOSIS — G5603 Carpal tunnel syndrome, bilateral upper limbs: Secondary | ICD-10-CM | POA: Diagnosis not present

## 2021-07-09 DIAGNOSIS — G5601 Carpal tunnel syndrome, right upper limb: Secondary | ICD-10-CM | POA: Diagnosis not present

## 2021-07-09 MED ORDER — METHYLPREDNISOLONE ACETATE 40 MG/ML IJ SUSP
40.0000 mg | Freq: Once | INTRAMUSCULAR | Status: AC
Start: 2021-07-09 — End: 2021-07-09
  Administered 2021-07-09: 40 mg via INTRA_ARTICULAR

## 2021-07-09 MED ORDER — GABAPENTIN 300 MG PO CAPS: 300.0000 mg | ORAL_CAPSULE | Freq: Two times a day (BID) | ORAL | 1 refills | Status: DC

## 2021-07-09 NOTE — Progress Notes (Signed)
PCP: Wanda Alar, NP ? ?Subjective:  ? ?HPI: ?Wanda Williams is a very pleasant 51 y.o. female here for evaluation of bilateral carpal tunnel syndrome. ? ?She was last seen by myself on 06/16/2021 and at that point did have an ultrasound-guided carpal tunnel injection into the left wrist.  She states this helped significantly improve her symptoms.  She will occasionally get some tingling of the thumb and fingers of the left, but much improved compared to the right side. ? ?She does report improved symptoms of her right trigger finger wearing the splint.  This morning she awoke with more significant numbness and tingling into the right thumb.  Sometimes it will go into fingers 1 and 2 as well.  She does note some swelling and tingling that is worse in the mornings and usually gets better as the day goes on.  She continues working and has been very busy working at [employer].  She denies any redness or swelling. ? ?BP 124/88   Ht '4\' 10"'$  (1.473 m)   Wt 200 lb (90.7 kg)   LMP 04/20/2016 (LMP Unknown) Comment: BTL  BMI 41.80 kg/m?  ? ? ?  05/19/2021  ? 10:01 AM 06/16/2021  ?  9:54 AM  ?[facility] Adult Exercise  ?Frequency of aerobic exercise (# of days/week) 2 2  ?Average time in minutes 5 5  ?Frequency of strengthening activities (# of days/week) 7 7  ? ? ?   ? View : No data to display.  ?  ?  ?  ? ? ?    ?Objective:  ?Physical Exam: ? ?Gen: Well-appearing, in no acute distress; non-toxic ?CV: Regular Rate. Well-perfused. Warm.  ?Resp: Breathing unlabored on room air; no wheezing. ?Psych: Fluid speech in conversation; appropriate affect; normal thought process ?Neuro: Sensation intact throughout. No gross coordination deficits.  ?MSK:  ?- Right wrist: No gross swelling, erythema nor ecchymosis.  No bony TTP.  She has full active range of motion about the wrist in all directions.  5/5 strength of the wrist and with finger abduction/abduction. + Tinel's test at the carpal tunnel, + Phalen's and reverse  Phalen's testing.  Radial artery palpable. ? ?The left wrist has no swelling, erythema or ecchymosis.  There is negative Tinel's test at the carpal tunnel, negative Phalen's and reverse Phalen's testing. ?  ?Assessment & Plan:  ?1. Carpal tunnel syndrome, right wrist ?2. Carpal tunnel syndrome, left wrist - improving ?3.  Trigger finger, right 4th finger - improved ? ?Procedure: US-guided Carpal Tunnel Injection, Right Wrist ?After informed written consent and discussion on R/B/I, a timeout was performed, patient was seated on exam table. Area overlying the patient's Left carpal tunnel prepped with Betadine and alcohol swab then utilizing ultrasound guidance, patient's left carpal tunnel injected with a mixture of 2:2:1 lidocaine: bupivicaine: depomedrol '40mg'$ /ML with hydrodissection of median nerve from the flexor retinaculum in 360 degree fashion. Visualization of the needle was achieved with a transverse, in-plane approach. Patient tolerated procedure well without immediate complications. ? ?-Through shared decision making, elected to proceed with ultrasound-guided carpal tunnel injection and hydrodissection today ?-We will increase her gabapentin from 100 mg twice daily to slowly titrating up to 300 mg twice daily.  She will begin first with nighttime dosing up to that dose and then increase her a.m. dosing if tolerating ?-Continue nighttime splinting ?-May use ice or or antiinflammatories for any postinjection pain ?-Follow-up in the next 4-5 weeks ? ?Elba Barman, DO ?PGY-4, Sports Medicine Fellow ?[facility] ? ?This note was dictated using Dragon naturally speaking software and may contain errors in syntax, spelling, or content which have not been identified prior to signing this note.  ? ?Addendum:  I was the preceptor for this visit and available for immediate consultation.  Karlton Lemon MD CAQSM ? ?

## 2021-07-09 NOTE — Patient Instructions (Signed)
[facility],  ?It was great to see you again today, I am hopeful that this continues to improve your symptoms. ? ?Things for you to do: ?-Wear your brace on your right wrist for the next 2 days when working.  Continue both wrist braces at nighttime all the time. ?-Ice the wrist for 20 minutes 2-3 times a day ?-You may take ibuprofen or Aleve or Tylenol for any postinjection pain for the next 2 days ? ?-We will increase her gabapentin.  Start taking Gabapentin 100 mg in the morning and 300 mg at nighttime.  Do this for the next week and if you are tolerating it well, you may increase to 300 mg in the morning and 300 mg at nighttime. ? ?You will follow-up with me in about 1 month to reevaluate. ? ?If you have any further questions, please give the clinic a call [phone]. ? ?Elba Barman, DO ?[place] ? ?

## 2021-07-27 ENCOUNTER — Other Ambulatory Visit: Payer: Self-pay | Admitting: Family

## 2021-07-27 NOTE — Telephone Encounter (Signed)
Received refill request from Wanda Williams for Venlafaxine.  Last Rx went to mail order. Spoke with pt and verified she wants to keep RX with mail order.  Denial sent to [employer].

## 2021-08-06 ENCOUNTER — Ambulatory Visit: Payer: Managed Care, Other (non HMO) | Admitting: Sports Medicine

## 2021-10-13 ENCOUNTER — Encounter: Payer: Self-pay | Admitting: Family

## 2021-10-14 NOTE — Telephone Encounter (Signed)
Appt scheduled w/ Percell Miller on 10/18/21.

## 2021-10-18 ENCOUNTER — Ambulatory Visit (INDEPENDENT_AMBULATORY_CARE_PROVIDER_SITE_OTHER): Payer: Managed Care, Other (non HMO) | Admitting: Medical

## 2021-10-18 ENCOUNTER — Telehealth: Payer: Self-pay | Admitting: Medical

## 2021-10-18 VITALS — BP 131/71 | HR 79 | Temp 97.7°F | Resp 16 | Wt 207.0 lb

## 2021-10-18 DIAGNOSIS — R739 Hyperglycemia, unspecified: Secondary | ICD-10-CM | POA: Diagnosis not present

## 2021-10-18 DIAGNOSIS — I10 Essential (primary) hypertension: Secondary | ICD-10-CM | POA: Diagnosis not present

## 2021-10-18 DIAGNOSIS — M7631 Iliotibial band syndrome, right leg: Secondary | ICD-10-CM

## 2021-10-18 DIAGNOSIS — M25561 Pain in right knee: Secondary | ICD-10-CM

## 2021-10-18 DIAGNOSIS — R300 Dysuria: Secondary | ICD-10-CM

## 2021-10-18 DIAGNOSIS — M25551 Pain in right hip: Secondary | ICD-10-CM

## 2021-10-18 LAB — COMPREHENSIVE METABOLIC PANEL
ALT: 21 U/L (ref 0–35)
AST: 18 U/L (ref 0–37)
Albumin: 4.2 g/dL (ref 3.5–5.2)
Alkaline Phosphatase: 87 U/L (ref 39–117)
BUN: 16 mg/dL (ref 6–23)
CO2: 31 mEq/L (ref 19–32)
Calcium: 9.4 mg/dL (ref 8.4–10.5)
Chloride: 100 mEq/L (ref 96–112)
Creatinine, Ser: 0.6 mg/dL (ref 0.40–1.20)
GFR: 102.37 mL/min (ref >60.00)
Glucose, Bld: 119 mg/dL — ABNORMAL HIGH (ref 70–99)
Potassium: 3.8 mEq/L (ref 3.5–5.1)
Sodium: 140 mEq/L (ref 135–145)
Total Bilirubin: 0.3 mg/dL (ref 0.2–1.2)
Total Protein: 7.1 g/dL (ref 6.0–8.3)

## 2021-10-18 LAB — HEMOGLOBIN A1C: Hgb A1c MFr Bld: 6.3 % (ref 4.6–6.5)

## 2021-10-18 MED ORDER — METHYLPREDNISOLONE 4 MG PO TABS: ORAL_TABLET | ORAL | 0 refills | Status: DC

## 2021-10-18 MED ORDER — CEPHALEXIN 500 MG PO CAPS: 500.0000 mg | ORAL_CAPSULE | Freq: Two times a day (BID) | ORAL | 0 refills | Status: DC

## 2021-10-18 MED ORDER — PHENAZOPYRIDINE HCL 200 MG PO TABS: 200.0000 mg | ORAL_TABLET | Freq: Three times a day (TID) | ORAL | 0 refills | Status: DC | PRN

## 2021-10-18 MED ORDER — KETOROLAC TROMETHAMINE 60 MG/2ML IM SOLN
60.0000 mg | Freq: Once | INTRAMUSCULAR | Status: AC
  Administered 2021-10-18: 60 mg via INTRAMUSCULAR

## 2021-10-18 NOTE — Patient Instructions (Addendum)
Rt knee pain, later thigh and rt hip pain. Considering iliotibial band syndrome. We gave you toradol 60 mg IM.  Get a1c today since if above does not help much then consider taper prendisone as we wait sport med MD referral  Htn- bp well controlled today.  For recent dysuria will get UA and urine culture. Will have you start keflex pending urine study results.  Follow pu 10-14 days pcp or sooner if needed/

## 2021-10-18 NOTE — Progress Notes (Signed)
Subjective:    Patient ID: Wanda Williams, female    DOB: 07-27-1970, 51 y.o.   MRN: [id number]  HPI Pt  with rt knee pain. X 2 months. Pt was limping for 2 months and then eventually just recently started to get hip pain on right side.  Then just recently some pain in low back and some in left hip.  Pt states she has started out meloxicam 7.5 mg once a day. She also added ibuprofen. She realized should not use both.  Htn- bp well controlled.   Pt is not diabetic.    Review of Systems  Constitutional:  Negative for chills, fatigue and fever.  Respiratory:  Negative for cough, chest tightness, wheezing and stridor.   Cardiovascular:  Negative for chest pain and palpitations.  Gastrointestinal:  Negative for abdominal pain.  Genitourinary:  Positive for dysuria. Negative for difficulty urinating, dyspareunia, frequency, genital sores and hematuria.       On and off burning when urinates for one month. In February had infection. Last week daily pain.    Past Medical History:  Diagnosis Date   Allergy    Angio-edema    Anxiety    Depression    Eczema    HTN (hypertension)    Sleep apnea    uses CPAP nightly     Social History   Socioeconomic History   Marital status: Married    Spouse name: Not on file   Number of children: Not on file   Years of education: Not on file   Highest education level: Not on file  Occupational History   Not on file  Tobacco Use   Smoking status: Never   Smokeless tobacco: Never  Vaping Use   Vaping Use: Never used  Substance and Sexual Activity   Alcohol use: Yes    Alcohol/week: 0.0 standard drinks of alcohol    Comment: occasional wine   Drug use: No   Sexual activity: Yes    Birth control/protection: Surgical, Post-menopausal    Comment: BTL  Other Topics Concern   Not on file  Social History Narrative   Works at [employer]   One daughter age 69   One daughter is age 67   Enjoys cross stitching, playing games on her  cell phone.     2 dogs   Completed 12th grade.    Social Determinants of Health   Financial Resource Strain: Not on file  Food Insecurity: Not on file  Transportation Needs: Not on file  Physical Activity: Not on file  Stress: Not on file  Social Connections: Not on file  Intimate Partner Violence: Not on file    Past Surgical History:  Procedure Laterality Date   BREAST LUMPECTOMY WITH RADIOACTIVE SEED LOCALIZATION Left 11/27/2018   Procedure: RADIOACTIVE SEED GUIDED LEFT BREAST LUMPECTOMY;  Surgeon: Erroll Luna, MD;  Location: [facility];  Service: General;  Laterality: Left;   CESAREAN SECTION     x 2   DILATION AND CURETTAGE OF UTERUS     THYROID CYST EXCISION     TUBAL LIGATION  2006    Family History  Problem Relation Age of Onset   Heart disease Father        heart murmur   Parkinson's disease Father    Hypertension Mother    Coronary artery disease Paternal Uncle        2 uncles (both died before 71)   Coronary artery disease Paternal Aunt  MI, died before 99   Melanoma Brother 50       melanoma   Allergic rhinitis Sister    Colon cancer Neg Hx    Rectal cancer Neg Hx    Stomach cancer Neg Hx    Esophageal cancer Neg Hx     Allergies  Allergen Reactions   Ace Inhibitors Cough    Current Outpatient Medications on File Prior to Visit  Medication Sig Dispense Refill   amLODipine (NORVASC) 2.5 MG tablet Take 1 tablet (2.5 mg total) by mouth daily. 90 tablet 1   aspirin-acetaminophen-caffeine (EXCEDRIN MIGRAINE) 250-250-65 MG tablet Take 1-2 tablets by mouth every 6 (six) hours as needed for headache or migraine.      buPROPion (WELLBUTRIN XL) 150 MG 24 hr tablet Take 1 tablet (150 mg total) by mouth daily. 90 tablet 1   gabapentin (NEURONTIN) 100 MG capsule Take 1 capsule (100 mg total) by mouth 3 (three) times daily. 30 capsule 2   gabapentin (NEURONTIN) 300 MG capsule Take 1 capsule (300 mg total) by mouth 2 (two) times daily. 60  capsule 1   Multiple Vitamins-Minerals (MULTIVITAMIN WITH MINERALS) tablet Take 1 tablet by mouth at bedtime.      pyridOXINE (VITAMIN B-6) 100 MG tablet Take 1 tablet (100 mg total) by mouth daily. 60 tablet 1   triamcinolone (KENALOG) 0.025 % ointment Apply 1 application topically as needed.      valsartan-hydrochlorothiazide (DIOVAN-HCT) 160-25 MG tablet Take 1 tablet by mouth daily. 90 tablet 1   venlafaxine XR (EFFEXOR-XR) 150 MG 24 hr capsule TAKE ONE CAPSULE BY MOUTH DAILY WITH BREAKFAST 90 capsule 1   No current facility-administered medications on file prior to visit.    BP 131/71 (BP Location: Left Arm, Patient Position: Sitting, Cuff Size: Large)   Pulse 79   Temp 97.7 F (36.5 C) (Oral)   Resp 16   Wt 207 lb (93.9 kg)   LMP 04/20/2016 (LMP Unknown) Comment: BTL  SpO2 98%   BMI 43.26 kg/m        Objective:   Physical Exam  General- No acute distress. Pleasant patient. Neck- Full range of motion, no jvd Lungs- Clear, even and unlabored. Heart- regular rate and rhythm. Neurologic- CNII- XII grossly intact.   Rt hip- pain on palpation but not much on rom. Rt knee- pain on flexio and extension. Mild pop over superior patellar tendon. Rt thigh- lateral aspect tender.  Back- no cva tenderness. Abdomen- soft, nt. Nd, +bs, no rebound or guarding.       Assessment & Plan:   Patient Instructions  Rt knee pain, later thigh and rt hip pain. Considering iliotibial band syndrome. We gave you toradol 60 mg IM.  Get a1c today since if above does not help much then consider taper prendisone as we wait sport med MD referral  Htn- bp well controlled today.  For recent dysuria will get UA and urine culture. Will have you start keflex pending urine study results.  Follow pu 10-14 days pcp or sooner if needed/

## 2021-10-18 NOTE — Telephone Encounter (Signed)
I took out urine poct late at night so could close chart as it was not resulted yet. Will you contact lab to put order back in and result.

## 2021-10-21 LAB — URINE CULTURE
MICRO NUMBER:: 13775222
SPECIMEN QUALITY:: ADEQUATE

## 2021-10-25 ENCOUNTER — Ambulatory Visit (INDEPENDENT_AMBULATORY_CARE_PROVIDER_SITE_OTHER): Payer: Managed Care, Other (non HMO) | Admitting: Family Medicine

## 2021-10-25 VITALS — BP 145/80

## 2021-10-25 DIAGNOSIS — M7631 Iliotibial band syndrome, right leg: Secondary | ICD-10-CM | POA: Diagnosis not present

## 2021-10-25 DIAGNOSIS — G5603 Carpal tunnel syndrome, bilateral upper limbs: Secondary | ICD-10-CM | POA: Diagnosis not present

## 2021-10-25 NOTE — Patient Instructions (Addendum)
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 Ice over area of pain 3-4 times a day for 15 minutes at a time Hip side raise exercise 3 sets of 10 once a day - add weights if this becomes too easy. Stretches - hold for 20-30 seconds x 3 - do once or twice a day. Continue the meloxicam - can consider voltaren gel over area of pain also. If not improving, can consider physical therapy and/or steroid injection. The hope is as this improves the compensatory pain you have will go away as well. Follow up with me in 1 month. We will refer you to neurology to discuss nerve conduction studies of both upper extremities.  Referred to [facility] for EMG/NCS. Order faxed to [phone]

## 2021-10-25 NOTE — Progress Notes (Signed)
 PCP: Daryl Setter, NP  Subjective:   HPI: Patient is a 51 y.o. female here for right knee/thigh pain.  Right knee/thigh pain Patient reports lateral knee pain going up into the lateral right thigh and bilateral hips/groin Hears clicking and popping with standing No feeling of knee instability Does not think she had any injury to the area and cannot recall any trauma to the knee Saw PCP on 8/14 for this pain and received toradol  IM x1 and prednisone  dose-pak without relief Pain is worse with weight bearing Also has bilateral hip pain with pain into the groin, no numbness or tingling Tried Meloxicam  without relief On her feet all day at work  Past Medical History:  Diagnosis Date   Allergy    Angio-edema    Anxiety    Depression    Eczema    HTN (hypertension)    Sleep apnea    uses CPAP nightly    Current Outpatient Medications on File Prior to Visit  Medication Sig Dispense Refill   amLODipine  (NORVASC ) 2.5 MG tablet Take 1 tablet (2.5 mg total) by mouth daily. 90 tablet 1   aspirin-acetaminophen -caffeine (EXCEDRIN MIGRAINE) 250-250-65 MG tablet Take 1-2 tablets by mouth every 6 (six) hours as needed for headache or migraine.      buPROPion  (WELLBUTRIN  XL) 150 MG 24 hr tablet Take 1 tablet (150 mg total) by mouth daily. 90 tablet 1   cephALEXin  (KEFLEX ) 500 MG capsule Take 1 capsule (500 mg total) by mouth 2 (two) times daily. 14 capsule 0   gabapentin  (NEURONTIN ) 100 MG capsule Take 1 capsule (100 mg total) by mouth 3 (three) times daily. 30 capsule 2   gabapentin  (NEURONTIN ) 300 MG capsule Take 1 capsule (300 mg total) by mouth 2 (two) times daily. 60 capsule 1   methylPREDNISolone  (MEDROL ) 4 MG tablet Standard 6 day dose pack. 21 tablet 0   Multiple Vitamins-Minerals (MULTIVITAMIN WITH MINERALS) tablet Take 1 tablet by mouth at bedtime.      phenazopyridine  (PYRIDIUM ) 200 MG tablet Take 1 tablet (200 mg total) by mouth 3 (three) times daily as needed for pain. 10  tablet 0   pyridOXINE (VITAMIN B-6) 100 MG tablet Take 1 tablet (100 mg total) by mouth daily. 60 tablet 1   triamcinolone  (KENALOG ) 0.025 % ointment Apply 1 application topically as needed.      valsartan -hydrochlorothiazide  (DIOVAN -HCT) 160-25 MG tablet Take 1 tablet by mouth daily. 90 tablet 1   venlafaxine  XR (EFFEXOR -XR) 150 MG 24 hr capsule TAKE ONE CAPSULE BY MOUTH DAILY WITH BREAKFAST 90 capsule 1   No current facility-administered medications on file prior to visit.    Past Surgical History:  Procedure Laterality Date   BREAST LUMPECTOMY WITH RADIOACTIVE SEED LOCALIZATION Left 11/27/2018   Procedure: RADIOACTIVE SEED GUIDED LEFT BREAST LUMPECTOMY;  Surgeon: Vanderbilt Ned, MD;  Location: [facility];  Service: General;  Laterality: Left;   CESAREAN SECTION     x 2   DILATION AND CURETTAGE OF UTERUS     THYROID  CYST EXCISION     TUBAL LIGATION  2006    Allergies  Allergen Reactions   Ace Inhibitors Cough    LMP 04/20/2016 (LMP Unknown) Comment: BTL     05/19/2021   10:01 AM 06/16/2021    9:54 AM  [facility]  Frequency of aerobic exercise (# of days/week) 2 2  Average time in minutes 5 5  Frequency of strengthening activities (# of days/week) 7 7  No data to display              Objective:  Physical Exam:  Gen: NAD, comfortable in exam room  Right knee: No obvious bony abnormalities. No ecchymosis. Mild swelling over lateral knee compared to left. TTP over distal IT band.  No joint line tenderness. FROM with normal strength. Negative ant/post drawers. Negative valgus/varus testing. Negative lachman.  Negative mcmurrays. NV intact distally.  Right hip: No deformity. FROM with 5/5 strength except 3/5 strength hip abduction. No trochanter, other tenderness to palpation. NVI distally. Negative logroll Positive noble, mild tightness with ober. Negative faber, fadir, and piriformis stretches.     Assessment & Plan:  1. Right iliotibial band syndrome  Also w/ compensatory pain in hips, low back Discussed more conservative management (home stretches/PT) vs. Steroid injection Will start with home exercises, handout provided. Demonstrated hip side raise in office as well Ice over area of pain 3-4x per day  Continue Meloxicam , can consider Voltaren  gel over painful area as wel Can consider steroid injection and PT if no improvement, although there is financial barrier F/u in 1 month

## 2021-10-26 ENCOUNTER — Encounter: Payer: Self-pay | Admitting: Family Medicine

## 2021-10-26 NOTE — Addendum Note (Signed)
Addended by: Cyd Silence on: 10/26/2021 01:10 PM   Modules accepted: Orders

## 2021-10-27 ENCOUNTER — Other Ambulatory Visit: Payer: Self-pay | Admitting: Family

## 2021-11-15 ENCOUNTER — Ambulatory Visit (INDEPENDENT_AMBULATORY_CARE_PROVIDER_SITE_OTHER): Payer: Managed Care, Other (non HMO) | Admitting: Family Medicine

## 2021-11-15 VITALS — BP 124/76 | Ht <= 58 in | Wt 203.0 lb

## 2021-11-15 DIAGNOSIS — G5603 Carpal tunnel syndrome, bilateral upper limbs: Secondary | ICD-10-CM

## 2021-11-15 DIAGNOSIS — M7631 Iliotibial band syndrome, right leg: Secondary | ICD-10-CM

## 2021-11-15 NOTE — Patient Instructions (Addendum)
Start physical therapy for your right IT band/knee. Do home exercises on days you don't go to therapy. For the nerve conduction study: [facility] Wednesday 12/29/21 @ [id number], [place], [place] [address] Phone: [phone]

## 2021-11-15 NOTE — Progress Notes (Unsigned)
  Wanda Williams - 51 y.o. female MRN [id number]  Date of birth: 07-29-1970    CHIEF COMPLAINT:       SUBJECTIVE:   HPI:  IT Band  Notes knee pain/burning sensation on right side of knee, with pain in the right hip/glut when stretching to left. Notes that hip feels a little tight/like it needs to pop, when she leans. Also feels like the hip is locking up a little, with movements of extending the leg. Appreciates some wobbling with walking, 2/2 to pain. Is otherwise able to get around and move well enough. Not taking any medicine for the pain. No falls  Carpal Tunnel Taking gabapentin for hands, needing referral to different neurologist, last clinic was lacking staff. Bothering her all day, and notes that she is dropping things from hands, with a weak/numb feeling. Has been taking the molixacm, but does not appear to be working.    ROS:     See HPI  PERTINENT  PMH / PSH FH / / SH:  Past Medical, Surgical, Social, and Family History Reviewed & Updated in the EMR.  Pertinent findings include:  Carpal Tunnel, IT Band Syndrome  OBJECTIVE: BP 124/76   Ht '4\' 9"'$  (1.448 m)   Wt 203 lb (92.1 kg)   LMP 04/20/2016 (LMP Unknown) Comment: BTL  BMI 43.93 kg/m   Physical Exam:  Vital signs are reviewed.  GEN: Alert and oriented, NAD Pulm: Breathing unlabored PSY: normal mood, congruent affect  MSK: Right JME:QAST TTP over Right IT band, mild gluteal pain with abduction of right leg, full ROM at hip and knee, mild groin pain with abduction under resistance Wrist: No pain bilaterally with tapping on volar surface of wrist, no pain or thinning of hypothenar eminences. No pain with Phalen's test.   ASSESSMENT & PLAN:  1. IT Band Syndrome Patient continues to have pain in IT Band, gluteus muscles and groin area, despite home exercises. Patient would benefit from PT and will check with insurance. -PT referral  2. Carpal Tunnel Syndrome Patient continues to have weakness and pain in hands,  associated with carpal tunnel. Hand weakness causing patient to drop things, multiple times a day. Patient was needing second neurology referral for nerve testing, however office was called, patient has appointment scheduled.  -Nerve testing  Holley Bouche, MD PGY-2 [facility] Resident [place]

## 2021-11-16 ENCOUNTER — Encounter: Payer: Self-pay | Admitting: Family Medicine

## 2021-11-17 ENCOUNTER — Ambulatory Visit: Payer: Managed Care, Other (non HMO) | Admitting: Family

## 2021-11-17 VITALS — BP 124/80 | HR 90 | Temp 98.2°F | Resp 18 | Ht <= 58 in | Wt 209.4 lb

## 2021-11-17 DIAGNOSIS — I10 Essential (primary) hypertension: Secondary | ICD-10-CM | POA: Diagnosis not present

## 2021-11-17 DIAGNOSIS — R7303 Prediabetes: Secondary | ICD-10-CM

## 2021-11-17 DIAGNOSIS — F418 Other specified anxiety disorders: Secondary | ICD-10-CM

## 2021-11-17 DIAGNOSIS — Z808 Family history of malignant neoplasm of other organs or systems: Secondary | ICD-10-CM

## 2021-11-17 NOTE — Assessment & Plan Note (Signed)
Lab Results  Component Value Date   HGBA1C 6.3 10/18/2021   Discussed importance of diabetic diet/exercise and weight loss.

## 2021-11-17 NOTE — Progress Notes (Signed)
Subjective:   By signing my name below, I, Shehryar Baig, attest that this documentation has been prepared under the direction and in the presence of [name], NP 11/17/2021    Patient ID: Wanda Williams, female    DOB: Oct 28, 1970, 51 y.o.   MRN: [id number]  Chief Complaint  Patient presents with   Hypertension   Depression   Follow-up    Patient is in today for a follow up visit. She is present with her husband during this visit.   Blood pressure- Her blood pressure is doing well during this visit. She continues taking 2.5 mg amlodipine daily PO, 160-25 mg valsartan-hydrochlorothiazide daily PO and reports no new issues while taking them.   BP Readings from Last 3 Encounters:  11/17/21 124/80  11/15/21 124/76  10/25/21 (!) 145/80   Pulse Readings from Last 3 Encounters:  11/17/21 90  10/18/21 79  05/12/21 84   A1C- Her last a1c levels were borderline diabetic.   Lab Results  Component Value Date   HGBA1C 6.3 10/18/2021   Mood- She continues taking 150 mg venlafaxine XR daily PO, 150 mg Wellbutrin XL daily PO and reports her mood is stable while taking it.   Dermatologist- She does not follow up with a dermatologist regularly. She is interested setting up an appointment to see one. Her father has a history of skin cancer.   Immunizations- She is interested in a flu vaccine during this visit. She was informed of the upcomming Covid-19 booster vaccine.    Health Maintenance Due  Topic Date Due   COVID-19 Vaccine (3 - Pfizer risk series) 06/29/2019   INFLUENZA VACCINE  10/05/2021    Past Medical History:  Diagnosis Date   Allergy    Angio-edema    Anxiety    Depression    Eczema    HTN (hypertension)    Sleep apnea    uses CPAP nightly    Past Surgical History:  Procedure Laterality Date   BREAST LUMPECTOMY WITH RADIOACTIVE SEED LOCALIZATION Left 11/27/2018   Procedure: RADIOACTIVE SEED GUIDED LEFT BREAST LUMPECTOMY;  Surgeon: Erroll Luna,  MD;  Location: [facility];  Service: General;  Laterality: Left;   CESAREAN SECTION     x 2   DILATION AND CURETTAGE OF UTERUS     THYROID CYST EXCISION     TUBAL LIGATION  2006    Family History  Problem Relation Age of Onset   Heart disease Father        heart murmur   Parkinson's disease Father    Hypertension Mother    Coronary artery disease Paternal Uncle        2 uncles (both died before 40)   Coronary artery disease Paternal Aunt        MI, died before 15   Melanoma Brother 68       melanoma   Allergic rhinitis Sister    Colon cancer Neg Hx    Rectal cancer Neg Hx    Stomach cancer Neg Hx    Esophageal cancer Neg Hx     Social History   Socioeconomic History   Marital status: Married    Spouse name: Not on file   Number of children: Not on file   Years of education: Not on file   Highest education level: Not on file  Occupational History   Not on file  Tobacco Use   Smoking status: Never   Smokeless tobacco: Never  Vaping Use  Vaping Use: Never used  Substance and Sexual Activity   Alcohol use: Yes    Alcohol/week: 0.0 standard drinks of alcohol    Comment: occasional wine   Drug use: No   Sexual activity: Yes    Birth control/protection: Surgical, Post-menopausal    Comment: BTL  Other Topics Concern   Not on file  Social History Narrative   Works at [employer]   One daughter age 45   One daughter is age 44   Enjoys cross stitching, playing games on her cell phone.     2 dogs   Completed 12th grade.    Social Determinants of Health   Financial Resource Strain: Not on file  Food Insecurity: Not on file  Transportation Needs: Not on file  Physical Activity: Not on file  Stress: Not on file  Social Connections: Not on file  Intimate Partner Violence: Not on file    Outpatient Medications Prior to Visit  Medication Sig Dispense Refill   amLODipine (NORVASC) 2.5 MG tablet TAKE 1 TABLET BY MOUTH DAILY 90 tablet 1    aspirin-acetaminophen-caffeine (EXCEDRIN MIGRAINE) 250-250-65 MG tablet Take 1-2 tablets by mouth every 6 (six) hours as needed for headache or migraine.      buPROPion (WELLBUTRIN XL) 150 MG 24 hr tablet TAKE 1 TABLET BY MOUTH DAILY 90 tablet 1   gabapentin (NEURONTIN) 100 MG capsule Take 1 capsule (100 mg total) by mouth 3 (three) times daily. 30 capsule 2   gabapentin (NEURONTIN) 300 MG capsule Take 1 capsule (300 mg total) by mouth 2 (two) times daily. 60 capsule 1   Multiple Vitamins-Minerals (MULTIVITAMIN WITH MINERALS) tablet Take 1 tablet by mouth at bedtime.      phenazopyridine (PYRIDIUM) 200 MG tablet Take 1 tablet (200 mg total) by mouth 3 (three) times daily as needed for pain. 10 tablet 0   pyridOXINE (VITAMIN B-6) 100 MG tablet Take 1 tablet (100 mg total) by mouth daily. 60 tablet 1   triamcinolone (KENALOG) 0.025 % ointment Apply 1 application topically as needed.      valsartan-hydrochlorothiazide (DIOVAN-HCT) 160-25 MG tablet TAKE 1 TABLET BY MOUTH DAILY 90 tablet 1   venlafaxine XR (EFFEXOR-XR) 150 MG 24 hr capsule TAKE 1 CAPSULE BY MOUTH DAILY  WITH BREAKFAST 90 capsule 1   cephALEXin (KEFLEX) 500 MG capsule Take 1 capsule (500 mg total) by mouth 2 (two) times daily. (Patient not taking: Reported on 11/17/2021) 14 capsule 0   methylPREDNISolone (MEDROL) 4 MG tablet Standard 6 day dose pack. (Patient not taking: Reported on 11/17/2021) 21 tablet 0   No facility-administered medications prior to visit.    Allergies  Allergen Reactions   Ace Inhibitors Cough    Review of Systems  Psychiatric/Behavioral:  Positive for depression.        Objective:    Physical Exam Constitutional:      General: She is not in acute distress.    Appearance: Normal appearance. She is not ill-appearing.  HENT:     Head: Normocephalic and atraumatic.     Right Ear: External ear normal.     Left Ear: External ear normal.  Eyes:     Extraocular Movements: Extraocular movements intact.      Pupils: Pupils are equal, round, and reactive to light.  Cardiovascular:     Rate and Rhythm: Normal rate and regular rhythm.     Heart sounds: Normal heart sounds. No murmur heard.    No gallop.  Pulmonary:  Effort: Pulmonary effort is normal. No respiratory distress.     Breath sounds: Normal breath sounds. No wheezing or rales.  Skin:    General: Skin is warm and dry.  Neurological:     Mental Status: She is alert and oriented to person, place, and time.  Psychiatric:        Judgment: Judgment normal.     BP 124/80 (BP Location: Left Arm, Patient Position: Sitting, Cuff Size: Large)   Pulse 90   Temp 98.2 F (36.8 C) (Oral)   Resp 18   Ht '4\' 9"'$  (1.448 m)   Wt 209 lb 6.4 oz (95 kg)   LMP 04/20/2016 (LMP Unknown) Comment: BTL  SpO2 96%   BMI 45.31 kg/m  Wt Readings from Last 3 Encounters:  11/17/21 209 lb 6.4 oz (95 kg)  11/15/21 203 lb (92.1 kg)  10/18/21 207 lb (93.9 kg)       Assessment & Plan:   Problem List Items Addressed This Visit       Unprioritized   Family history of melanoma - Primary   Relevant Orders   Ambulatory referral to Dermatology   Essential hypertension    BP Readings from Last 3 Encounters:  11/17/21 124/80  11/15/21 124/76  10/25/21 (!) 145/80  At goal. Continue valsartan-HCT and amlodipine 2.'5mg'$ .       Depression with anxiety    Stable on wellbutrin and effexor. Continue same.       Borderline diabetes    Lab Results  Component Value Date   HGBA1C 6.3 10/18/2021  Discussed importance of diabetic diet/exercise and weight loss.         No orders of the defined types were placed in this encounter.   I, [name], NP, personally preformed the services described in this documentation.  All medical record entries made by the scribe were at my direction and in my presence.  I have reviewed the chart and discharge instructions (if applicable) and agree that the record reflects my personal performance and is accurate  and complete. 11/17/2021   I,Shehryar Baig,acting as a Education administrator for [name], NP.,have documented all relevant documentation on the behalf of [name], NP,as directed by  [name], NP while in the presence of [name], NP.   [name], NP

## 2021-11-17 NOTE — Assessment & Plan Note (Signed)
Stable on wellbutrin and effexor. Continue same.

## 2021-11-17 NOTE — Assessment & Plan Note (Signed)
BP Readings from Last 3 Encounters:  11/17/21 124/80  11/15/21 124/76  10/25/21 (!) 145/80   At goal. Continue valsartan-HCT and amlodipine 2.'5mg'$ .

## 2021-11-22 ENCOUNTER — Ambulatory Visit: Payer: Managed Care, Other (non HMO) | Admitting: Family Medicine

## 2021-12-29 ENCOUNTER — Ambulatory Visit: Payer: Managed Care, Other (non HMO) | Admitting: Physician Assistant

## 2022-02-22 ENCOUNTER — Encounter: Payer: Self-pay | Admitting: Family Medicine

## 2022-02-22 NOTE — Progress Notes (Signed)
Received NCV/EMG results from [facility].  Patient has moderate-severe carpal tunnel syndrome worse on the right.  Will go ahead with referral to hand surgeon to discuss releases.

## 2022-02-22 NOTE — Progress Notes (Signed)
Referral to [facility]  Dr. Leanora Cover [facility] [phone]  Appt: 03/09/22 @ 10 am, pt will call to reschedule*

## 2022-02-23 ENCOUNTER — Other Ambulatory Visit: Payer: Self-pay | Admitting: Family

## 2022-03-11 ENCOUNTER — Telehealth: Payer: Self-pay | Admitting: Cardiovascular Disease

## 2022-03-11 ENCOUNTER — Other Ambulatory Visit: Payer: Self-pay | Admitting: Orthopedic Surgery

## 2022-03-11 NOTE — Telephone Encounter (Signed)
   Pre-operative Risk Assessment    Patient Name: Wanda Williams  DOB: November 08, 1970 MRN: [id number]      Request for Surgical Clearance    Procedure:   Right Carpal Tunnel Release   Date of Surgery:  Clearance 05/09/22                                 Surgeon:  Dr. Leanora Cover  Surgeon's Group or Practice Name:  [facility]  Phone number:  [phone]  Fax number:  [phone]    Type of Clearance Requested:   - Medical    Type of Anesthesia:   choice    Additional requests/questions:   Pt is scheduled for 04/06/22 with Dr. Claiborne Billings   Signed, Chloe R McNeil   03/11/2022, 10:03 AM

## 2022-03-15 ENCOUNTER — Ambulatory Visit: Payer: Managed Care, Other (non HMO) | Admitting: Cardiovascular Disease

## 2022-04-06 ENCOUNTER — Ambulatory Visit: Payer: Managed Care, Other (non HMO) | Attending: Cardiovascular Disease | Admitting: Cardiovascular Disease

## 2022-04-06 ENCOUNTER — Encounter: Payer: Self-pay | Admitting: Cardiovascular Disease

## 2022-04-06 DIAGNOSIS — Z13220 Encounter for screening for lipoid disorders: Secondary | ICD-10-CM

## 2022-04-06 DIAGNOSIS — I10 Essential (primary) hypertension: Secondary | ICD-10-CM

## 2022-04-06 DIAGNOSIS — Z01810 Encounter for preprocedural cardiovascular examination: Secondary | ICD-10-CM

## 2022-04-06 DIAGNOSIS — F418 Other specified anxiety disorders: Secondary | ICD-10-CM

## 2022-04-06 DIAGNOSIS — Z131 Encounter for screening for diabetes mellitus: Secondary | ICD-10-CM

## 2022-04-06 DIAGNOSIS — G4733 Obstructive sleep apnea (adult) (pediatric): Secondary | ICD-10-CM | POA: Diagnosis not present

## 2022-04-06 DIAGNOSIS — G5601 Carpal tunnel syndrome, right upper limb: Secondary | ICD-10-CM

## 2022-04-06 NOTE — Progress Notes (Signed)
Cardiology Office Note    Date:  04/10/2022   ID:  Wanda Williams, DOB October 29, 1970, MRN [id number]  PCP:  [name], NP  Cardiologist:  Shelva Majestic, MD   3 year f/u Cardiology [name] evaluation and pre-operative clearance prior to undergoing carpal tunnel surgery  History of Present Illness:  Wanda Williams is a 52 y.o. female who I saw on May 07, 2018 for initial cardiology and sleep evaluation.  I have not seen her since, she now presents for preoperative clearance and follow-up cardiology/sleep evaluation.  When I initially saw Ms. Wanda Williams she was working at [employer] at [employer] has been seen in the office  by Kerin Ransom in September 2019.  She has a history of hypertension as well as depression and had been evaluated in the ER for transient slurred speech.  She ultimately underwent an echo Doppler study which showed an EF of 55 to 67%, grade 2 diastolic dysfunction mild MR.  A routine treadmill test in October 2019 showed no significant ST-T wave abnormalities.  He wore an event monitor from the end of November until the end of December which showed predominant sinus rhythm.  Slowest heart rate was sinus bradycardia at 54 bpm while sleeping and the maximal heart rate was sinus tachycardia at 138.  She had very rare isolated PVCs and 1 instance where there were 3 PVCs and 1 minute.  There were no episodes of A. fib, SVT, VT or pauses.  She underwent a sleep study  due to a high suspicion for sleep apnea was done as a home study number 06/2007.  This revealed moderate obstructive sleep apnea with an AHI of 25.2 and she had severe oxygen desaturation to a nadir of 73%.  She snored for 17.1% of her sleep.    Insurance denied an in lab CPAP titration and she ultimately was started on AutoPap therapy with set up on February 14, 2018.  A download was obtained in the office today shows 100% compliance.  She was averaging 7 hours and 26 minutes of CPAP use per night.  She was  initially set at a minimum pressure of 6 up to a maximum of 20.  Her 95 percentile average pressure was 12.8 with a maximum average pressure of 14.7.  He has noticed significantly more energy in the morning she also has noticed a significant reduction in her previous nocturia and now gets up 1 time per night.  Typically goes to bed between 1030 and 11 PM and wakes up between 6 and 7:30 AM.  She works typically from noon until 9:30 PM.  She presents for her initial evaluation.  Epworth sleepiness scale was calculated in the office today this endorsed at 13 visit with still residual daytime sleepiness.  Epworth Sleepiness Scale: Situation   Chance of Dozing/Sleeping (0 = never , 1 = slight chance , 2 = moderate chance , 3 = high chance )   sitting and reading 2   watching TV 1   sitting inactive in a public place 3   being a passenger in a motor vehicle for an hour or more 2   lying down in the afternoon 3   sitting and talking to someone 1   sitting quietly after lunch (no alcohol) 1   while stopped for a few minutes in traffic as the driver 0   Total Score  13   During that evaluation, she was meeting compliance standards.  With  her morbid obesity we discussed the importance of exercise and weight loss.  Her blood pressure was slightly elevated and she was on valsartan HCT 160/25 mg daily.  Since I last saw her, she has been followed by [name], NP for primary care.  She denies any chest pain or shortness of breath.  Over the past year, she had a live away from her home caring for her older daughters grandchild.  She recently had developed a cat scratch on her left hand resulting in left axilla adenopathy and she was treated with antibiotics.  She is in need to undergo right carpal tunnel release surgery which is tentatively scheduled by Dr. Fredna Dow from May 09, 2022.  She is now referred for preoperative clearance.  Presently, she is on a medical regimen low-dose amlodipine 2.5 mg and  valsartan HCT 160/25 mg daily for hypertension.  She is on Wellbutrin in addition to Effexor for depression.  She takes gabapentin 300 mg twice a day for neuropathy.  She takes Excedrin Migraine pills for headache or migraine episodes.  She has continued to use CPAP.  I obtained a download from January 1 through April 05, 2022.  She is continuing to meet compliance standards with average use at 7 hours and 13 minutes per night.  Her CPAP unit is set at a range of 9 to 18 cm of water.  AHI is excellent at 0.9 with 95th percentile pressure at 13.6 with maximum average pressure of 15.4.  [employer] is her DME company.  She presents for evaluation.  Past Medical History:  Diagnosis Date   Allergy    Angio-edema    Anxiety    Depression    Eczema    HTN (hypertension)    Sleep apnea    uses CPAP nightly    Past Surgical History:  Procedure Laterality Date   BREAST LUMPECTOMY WITH RADIOACTIVE SEED LOCALIZATION Left 11/27/2018   Procedure: RADIOACTIVE SEED GUIDED LEFT BREAST LUMPECTOMY;  Surgeon: Erroll Luna, MD;  Location: [place];  Service: General;  Laterality: Left;   CESAREAN SECTION     x 2   DILATION AND CURETTAGE OF UTERUS     THYROID CYST EXCISION     TUBAL LIGATION  2006    Current Medications: Outpatient Medications Prior to Visit  Medication Sig Dispense Refill   amLODipine (NORVASC) 2.5 MG tablet TAKE 1 TABLET BY MOUTH DAILY 90 tablet 1   aspirin-acetaminophen-caffeine (EXCEDRIN MIGRAINE) 250-250-65 MG tablet Take 1-2 tablets by mouth every 6 (six) hours as needed for headache or migraine.      buPROPion (WELLBUTRIN XL) 150 MG 24 hr tablet TAKE 1 TABLET BY MOUTH DAILY 90 tablet 1   gabapentin (NEURONTIN) 300 MG capsule Take 1 capsule (300 mg total) by mouth 2 (two) times daily. 60 capsule 1   Multiple Vitamins-Minerals (MULTIVITAMIN WITH MINERALS) tablet Take 1 tablet by mouth at bedtime.      triamcinolone (KENALOG) 0.025 % ointment Apply 1 application  topically as needed.      valsartan-hydrochlorothiazide (DIOVAN-HCT) 160-25 MG tablet TAKE 1 TABLET BY MOUTH DAILY 90 tablet 1   venlafaxine XR (EFFEXOR-XR) 150 MG 24 hr capsule TAKE 1 CAPSULE BY MOUTH DAILY  WITH BREAKFAST 90 capsule 1   gabapentin (NEURONTIN) 100 MG capsule Take 1 capsule (100 mg total) by mouth 3 (three) times daily. 30 capsule 2   phenazopyridine (PYRIDIUM) 200 MG tablet Take 1 tablet (200 mg total) by mouth 3 (three) times daily as needed for  pain. 10 tablet 0   pyridOXINE (VITAMIN B-6) 100 MG tablet Take 1 tablet (100 mg total) by mouth daily. 60 tablet 1   No facility-administered medications prior to visit.     Allergies:   Ace inhibitors   Social History   Socioeconomic History   Marital status: Married    Spouse name: Not on file   Number of children: Not on file   Years of education: Not on file   Highest education level: Not on file  Occupational History   Not on file  Tobacco Use   Smoking status: Never   Smokeless tobacco: Never  Vaping Use   Vaping Use: Never used  Substance and Sexual Activity   Alcohol use: Yes    Alcohol/week: 0.0 standard drinks of alcohol    Comment: occasional wine   Drug use: No   Sexual activity: Yes    Birth control/protection: Surgical, Post-menopausal    Comment: BTL  Other Topics Concern   Not on file  Social History Narrative   Works at [employer]   One daughter age 68   One daughter is age 71   Enjoys cross stitching, playing games on her cell phone.     2 dogs   Completed 12th grade.    Social Determinants of Health   Financial Resource Strain: Not on file  Food Insecurity: Not on file  Transportation Needs: Not on file  Physical Activity: Not on file  Stress: Not on file  Social Connections: Not on file     Family History:  The patient's family history includes Allergic rhinitis in her sister; Coronary artery disease in her paternal aunt and paternal uncle; Heart disease in her father;  Hypertension in her mother; Melanoma (age of onset: 42) in her brother; Parkinson's disease in her father.   ROS General: Negative; No fevers, chills, or night sweats;  HEENT: Negative; No changes in vision or hearing, sinus congestion, difficulty swallowing Pulmonary: Negative; No cough, wheezing, shortness of breath, hemoptysis Cardiovascular: Negative; No chest pain, presyncope, syncope, palpitations GI: Negative; No nausea, vomiting, diarrhea, or abdominal pain GU: Negative; No dysuria, hematuria, or difficulty voiding Musculoskeletal: In need for right carpal tunnel release Hematologic/Oncology: Negative; no easy bruising, bleeding Endocrine: Negative; no heat/cold intolerance; no diabetes Neuro: Negative; no changes in balance, headaches Skin: Negative; No rashes or skin lesions Psychiatric: Negative; No behavioral problems, depression Sleep: obstructive sleep apnea,  on CPAP.  , daytime sleepiness, hypersomnolence,  nobruxism, restless legs, hypnogognic hallucinations, no cataplexy Other comprehensive 14 point system review is negative.   PHYSICAL EXAM:   VS:  BP 128/82   Pulse 77   Ht '4\' 10"'$  (1.473 m)   Wt 199 lb 3.2 oz (90.4 kg)   LMP 04/20/2016 (LMP Unknown) Comment: BTL  SpO2 97%   BMI 41.63 kg/m     Repeat blood pressure by me 120/78  Wt Readings from Last 3 Encounters:  04/06/22 199 lb 3.2 oz (90.4 kg)  11/17/21 209 lb 6.4 oz (95 kg)  11/15/21 203 lb (92.1 kg)    General: Alert, oriented, no distress.  Skin: normal turgor, no rashes, warm and dry HEENT: Normocephalic, atraumatic. Pupils equal round and reactive to light; sclera anicteric; extraocular muscles intact;  Nose without nasal septal hypertrophy Mouth/Parynx benign; Mallinpatti scale 3 Neck: No JVD, no carotid bruits; normal carotid upstroke Lungs: clear to ausculatation and percussion; no wheezing or rales Chest wall: without tenderness to palpitation Heart: PMI not displaced, RRR, s1 s2 normal, 1/6  systolic murmur, no diastolic murmur, no rubs, gallops, thrills, or heaves Abdomen: soft, nontender; no hepatosplenomehaly, BS+; abdominal aorta nontender and not dilated by palpation. Back: no CVA tenderness Pulses 2+ Musculoskeletal: full range of motion, normal strength, no joint deformities Extremities: no clubbing cyanosis or edema, Homan's sign negative  Neurologic: grossly nonfocal; Cranial nerves grossly wnl Psychologic: Normal mood and affect   Studies/Labs Reviewed:   April 06, 2022 ECG (independently read by me): NSR at 77, no ectopy, no STT changes  May 07, 2018 ECG (independently read by me): Normal sinus rhythm at 66 bpm.  No ectopy.  Normal intervals.  Recent Labs:    Latest Ref Rng & Units 04/06/2022    9:56 AM 10/18/2021    2:06 PM 05/12/2021   10:17 AM  BMP  Glucose 70 - 99 mg/dL 97  119  92   BUN 6 - 24 mg/dL '16  16  20   '$ Creatinine 0.57 - 1.00 mg/dL 0.53  0.60  0.53   BUN/Creat Ratio 9 - 23 30     Sodium 134 - 144 mmol/L 142  140  142   Potassium 3.5 - 5.2 mmol/L 3.9  3.8  4.4   Chloride 96 - 106 mmol/L 102  100  103   CO2 20 - 29 mmol/L 25  31  33   Calcium 8.7 - 10.2 mg/dL 9.2  9.4  9.1         Latest Ref Rng & Units 04/06/2022    9:56 AM 10/18/2021    2:06 PM 05/12/2021   10:17 AM  Hepatic Function  Total Protein 6.0 - 8.5 g/dL 6.6  7.1  6.7   Albumin 3.8 - 4.9 g/dL 4.2  4.2  4.1   AST 0 - 40 IU/L '22  18  19   '$ ALT 0 - 32 IU/L '23  21  21   '$ Alk Phosphatase 44 - 121 IU/L 99  87  97   Total Bilirubin 0.0 - 1.2 mg/dL 0.2  0.3  0.2        Latest Ref Rng & Units 04/06/2022    9:56 AM 01/19/2021    1:12 AM 11/23/2018   11:00 AM  CBC  WBC 3.4 - 10.8 x10E3/uL 6.2  11.2  7.7   Hemoglobin 11.1 - 15.9 g/dL 12.7  13.8  12.4   Hematocrit 34.0 - 46.6 % 39.3  40.8  37.1   Platelets 150 - 450 x10E3/uL 430  489  397    Lab Results  Component Value Date   MCV 93 04/06/2022   MCV 90.9 01/19/2021   MCV 92.1 11/23/2018   Lab Results  Component Value Date    TSH 3.490 04/06/2022   Lab Results  Component Value Date   HGBA1C 6.1 (H) 04/06/2022     BNP No results found for: "BNP"  ProBNP No results found for: "PROBNP"   Lipid Panel     Component Value Date/Time   CHOL 164 04/06/2022 0956   TRIG 145 04/06/2022 0956   HDL 41 04/06/2022 0956   CHOLHDL 4.0 04/06/2022 0956   CHOLHDL 5 05/12/2021 1017   VLDL 57.0 (H) 05/12/2021 1017   LDLCALC 97 04/06/2022 0956   LDLDIRECT 107.0 05/12/2021 1017     RADIOLOGY: No results found.   Additional studies/ records that were reviewed today include:  The patient's prior office visit evaluations, sleep study, obtained a download in the office, cardiographic and treadmill testing.  ASSESSMENT:    1. Pre-operative cardiovascular  examination   2. Essential hypertension   3. OSA (obstructive sleep apnea)   4. Morbid obesity (HCC)   5. Carpal tunnel syndrome of right wrist   6. Depression with anxiety   7. Lipid screening   8. Screening for diabetes mellitus (DM)      PLAN:  Ms. Gift is a 52 year old female who has a history of hypertension,  depression morbid morbid obesity, and obstructive sleep apnea on CPAP therapy.  On a home sleep study, she was found to have at least moderate overall sleep apnea but I suspect this was much more severe during REM sleep by virtue of her severe oxygen desaturation to 73%.  She initiated CPAP therapy on February 14, 2018 with a ResMed AirSense 10 CPAP auto unit with [employer] as her DME company.  At my previous evaluation with her in March 2020 she was meeting compliance standards.  She has continued to use CPAP over the past almost 4 years.  Her most recent download from January 1 through April 05, 2022 shows excellent use averaging 7 hours and 13 minutes per night.  AHI is excellent at 0.9 and her 95th percentile pressure is 13.6 with maximum average pressure of 15.4.  She is in need to undergo right carpal tunnel release surgery which is tentatively  scheduled on May 09, 2022 by Dr. Leanora Cover.  She is asymptomatic with reference to chest pain or shortness of breath.  She had developed recent cataract infection which has cleared.  Her blood pressure today is stable on a regimen of amlodipine 2.5 mg and valsartan HCT 160/12.5 mg.  She believes her depression is controlled with bupropion in addition to venlafaxine XR.  I am recommending she undergo laboratory in the fasting state including a comprehensive metabolic panel, CBC, hemoglobin A1c, TSH, fasting lipid studies I will also check an LP(a) which I discussed with her today as an independent risk factor above and beyond LDL cholesterol and is genetically driven.  I am also recommending she undergo a 2D echo Doppler study now almost 5 years since her prior study in 2019 which showed EF 50 to 76%, grade 2 diastolic dysfunction and mild MR.  I will contact her regarding these results.   Her BMI today is 41.63.  Weight loss and increased exercise is recommended. Unless there is some major abnormality demonstrated on the above studies, she is given clearance to undergo her planned carpal tunnel release surgery.   Medication Adjustments/Labs and Tests Ordered: Current medicines are reviewed at length with the patient today.  Concerns regarding medicines are outlined above.  Medication changes, Labs and Tests ordered today are listed in the Patient Instructions below. Patient Instructions  Medication Instructions:  Your physician recommends that you continue on your current medications as directed. Please refer to the Current Medication list given to you today.  *If you need a refill on your cardiac medications before your next appointment, please call your pharmacy*   Lab Work: CMET, CBC, Lipid, TSH, HmgA1C, Lp(a) today  If you have labs (blood work) drawn today and your tests are completely normal, you will receive your results only by: [place] (if you have MyChart) OR A paper copy in the  mail If you have any lab test that is abnormal or we need to change your treatment, we will call you to review the results.   Testing/Procedures: Your physician has requested that you have an echocardiogram. Echocardiography is a painless test that uses sound waves to create images  of your heart. It provides your doctor with information about the size and shape of your heart and how well your heart's chambers and valves are working. This procedure takes approximately one hour. There are no restrictions for this procedure. Please do NOT wear cologne, perfume, aftershave, or lotions (deodorant is allowed). Please arrive 15 minutes prior to your appointment time.  Follow-Up: At [facility], you and your health needs are our priority.  As part of our continuing mission to provide you with exceptional heart care, we have created designated Provider Care Teams.  These Care Teams include your primary Cardiologist (physician) and Advanced Practice Providers (APPs -  Physician Assistants and Nurse Practitioners) who all work together to provide you with the care you need, when you need it.  We recommend signing up for the patient portal called "MyChart".  Sign up information is provided on this After Visit Summary.  MyChart is used to connect with patients for Virtual Visits (Telemedicine).  Patients are able to view lab/test results, encounter notes, upcoming appointments, etc.  Non-urgent messages can be sent to your provider as well.   To learn more about what you can do with MyChart, go to [email or web address].    Your next appointment:   AS NEEDED with Dr. Claiborne Billings      Signed, Shelva Majestic, MD  04/10/2022 5:00 PM    [facility] [address] [place], [place], [place]  [address] Phone: [phone]

## 2022-04-06 NOTE — Patient Instructions (Signed)
Medication Instructions:  Your physician recommends that you continue on your current medications as directed. Please refer to the Current Medication list given to you today.  *If you need a refill on your cardiac medications before your next appointment, please call your pharmacy*   Lab Work: CMET, CBC, Lipid, TSH, HmgA1C, Lp(a) today  If you have labs (blood work) drawn today and your tests are completely normal, you will receive your results only by: [place] (if you have MyChart) OR A paper copy in the mail If you have any lab test that is abnormal or we need to change your treatment, we will call you to review the results.   Testing/Procedures: Your physician has requested that you have an echocardiogram. Echocardiography is a painless test that uses sound waves to create images of your heart. It provides your doctor with information about the size and shape of your heart and how well your heart's chambers and valves are working. This procedure takes approximately one hour. There are no restrictions for this procedure. Please do NOT wear cologne, perfume, aftershave, or lotions (deodorant is allowed). Please arrive 15 minutes prior to your appointment time.  Follow-Up: At [facility], you and your health needs are our priority.  As part of our continuing mission to provide you with exceptional heart care, we have created designated Provider Care Teams.  These Care Teams include your primary Cardiologist (physician) and Advanced Practice Providers (APPs -  Physician Assistants and Nurse Practitioners) who all work together to provide you with the care you need, when you need it.  We recommend signing up for the patient portal called "MyChart".  Sign up information is provided on this After Visit Summary.  MyChart is used to connect with patients for Virtual Visits (Telemedicine).  Patients are able to view lab/test results, encounter notes, upcoming appointments, etc.   Non-urgent messages can be sent to your provider as well.   To learn more about what you can do with MyChart, go to [email or web address].    Your next appointment:   AS NEEDED with Dr. Claiborne Billings

## 2022-04-07 LAB — CBC
Hematocrit: 39.3 % (ref 34.0–46.6)
Hemoglobin: 12.7 g/dL (ref 11.1–15.9)
MCH: 30 pg (ref 26.6–33.0)
MCHC: 32.3 g/dL (ref 31.5–35.7)
MCV: 93 fL (ref 79–97)
Platelets: 430 10*3/uL (ref 150–450)
RBC: 4.24 x10E6/uL (ref 3.77–5.28)
RDW: 13.2 % (ref 11.7–15.4)
WBC: 6.2 10*3/uL (ref 3.4–10.8)

## 2022-04-07 LAB — COMPREHENSIVE METABOLIC PANEL
ALT: 23 IU/L (ref 0–32)
AST: 22 IU/L (ref 0–40)
Albumin/Globulin Ratio: 1.8 (ref 1.2–2.2)
Albumin: 4.2 g/dL (ref 3.8–4.9)
Alkaline Phosphatase: 99 IU/L (ref 44–121)
BUN/Creatinine Ratio: 30 — ABNORMAL HIGH (ref 9–23)
BUN: 16 mg/dL (ref 6–24)
Bilirubin Total: 0.2 mg/dL (ref 0.0–1.2)
CO2: 25 mmol/L (ref 20–29)
Calcium: 9.2 mg/dL (ref 8.7–10.2)
Chloride: 102 mmol/L (ref 96–106)
Creatinine, Ser: 0.53 mg/dL — ABNORMAL LOW (ref 0.57–1.00)
Globulin, Total: 2.4 g/dL (ref 1.5–4.5)
Glucose: 97 mg/dL (ref 70–99)
Potassium: 3.9 mmol/L (ref 3.5–5.2)
Sodium: 142 mmol/L (ref 134–144)
Total Protein: 6.6 g/dL (ref 6.0–8.5)
eGFR: 110 mL/min/{1.73_m2} (ref >59)

## 2022-04-07 LAB — LIPID PANEL
Chol/HDL Ratio: 4 ratio (ref 0.0–4.4)
Cholesterol, Total: 164 mg/dL (ref 100–199)
HDL: 41 mg/dL (ref >39)
LDL Chol Calc (NIH): 97 mg/dL (ref 0–99)
Triglycerides: 145 mg/dL (ref 0–149)
VLDL Cholesterol Cal: 26 mg/dL (ref 5–40)

## 2022-04-07 LAB — TSH: TSH: 3.49 u[IU]/mL (ref 0.450–4.500)

## 2022-04-07 LAB — LIPOPROTEIN A (LPA): Lipoprotein (a): 19.5 nmol/L (ref <75.0)

## 2022-04-07 LAB — HEMOGLOBIN A1C
Est. average glucose Bld gHb Est-mCnc: 128 mg/dL
Hgb A1c MFr Bld: 6.1 % — ABNORMAL HIGH (ref 4.8–5.6)

## 2022-04-10 ENCOUNTER — Encounter: Payer: Self-pay | Admitting: Cardiovascular Disease

## 2022-04-27 ENCOUNTER — Telehealth: Payer: Self-pay | Admitting: Cardiovascular Disease

## 2022-04-27 ENCOUNTER — Ambulatory Visit (HOSPITAL_BASED_OUTPATIENT_CLINIC_OR_DEPARTMENT_OTHER): Payer: Managed Care, Other (non HMO)

## 2022-04-27 DIAGNOSIS — Z01810 Encounter for preprocedural cardiovascular examination: Secondary | ICD-10-CM

## 2022-04-27 DIAGNOSIS — I10 Essential (primary) hypertension: Secondary | ICD-10-CM | POA: Diagnosis not present

## 2022-04-27 LAB — ECHOCARDIOGRAM COMPLETE
Area-P 1/2: 3.42 cm2
S' Lateral: 3.46 cm

## 2022-04-27 NOTE — Telephone Encounter (Signed)
Sent Dr. Evette Georges last ov note to Dr. Tawni Levy office  stating clearance was pending echo that was today.

## 2022-04-27 NOTE — Telephone Encounter (Signed)
Office calling to follow up on clearance. Please advise.

## 2022-04-29 NOTE — Telephone Encounter (Signed)
I will forward to pre op pool to check if Dr. Claiborne Billings has cleared the pt since he has had his echo.

## 2022-04-29 NOTE — Telephone Encounter (Signed)
[facility] back for an update on Echo results needed before surgery.

## 2022-04-29 NOTE — Telephone Encounter (Signed)
   Name: Wanda Williams  DOB: August 31, 1970  MRN: [id number]   Primary Cardiologist: Sanda Klein, MD  Chart reviewed as part of pre-operative protocol coverage. Wanda Williams was last seen on 04/06/2022 by Dr. Claiborne Billings.  Per Dr. Claiborne Billings "I am also recommending she undergo a 2D echo Doppler study now almost 5 years since her prior study in 2019 which showed EF 50 to 123456, grade 2 diastolic dysfunction and mild MR. I will contact her regarding these results. Her BMI today is 41.63. Weight loss and increased exercise is recommended. Unless there is some major abnormality demonstrated on the above studies, she is given clearance to undergo her planned carpal tunnel release surgery." Updated echo performed on 04/27/2022 was essentially unremarkable with no significant change from 2019.   Therefore, based on ACC/AHA guidelines, the patient would be at acceptable risk for the planned procedure without further cardiovascular testing.   I will route this recommendation to the requesting party via Epic fax function and remove from pre-op pool. Please call with questions.  Mayra Reel, NP 04/29/2022, 12:13 PM

## 2022-05-02 ENCOUNTER — Encounter (HOSPITAL_BASED_OUTPATIENT_CLINIC_OR_DEPARTMENT_OTHER): Payer: Self-pay | Admitting: Orthopedic Surgery

## 2022-05-02 ENCOUNTER — Other Ambulatory Visit: Payer: Self-pay

## 2022-05-05 NOTE — Progress Notes (Signed)

## 2022-05-09 ENCOUNTER — Ambulatory Visit (HOSPITAL_BASED_OUTPATIENT_CLINIC_OR_DEPARTMENT_OTHER)
Admission: RE | Admit: 2022-05-09 | Discharge: 2022-05-09 | Disposition: A | Discharge status: Home / Self Care | Payer: Managed Care, Other (non HMO) | Attending: Orthopedic Surgery | Admitting: Orthopedic Surgery

## 2022-05-09 ENCOUNTER — Encounter (HOSPITAL_BASED_OUTPATIENT_CLINIC_OR_DEPARTMENT_OTHER)
Admission: RE | Disposition: A | Discharge status: Home / Self Care | Payer: Self-pay | Source: Home / Self Care | Attending: Orthopedic Surgery

## 2022-05-09 ENCOUNTER — Encounter (HOSPITAL_BASED_OUTPATIENT_CLINIC_OR_DEPARTMENT_OTHER): Payer: Self-pay | Admitting: Orthopedic Surgery

## 2022-05-09 ENCOUNTER — Other Ambulatory Visit: Payer: Self-pay

## 2022-05-09 ENCOUNTER — Ambulatory Visit (HOSPITAL_BASED_OUTPATIENT_CLINIC_OR_DEPARTMENT_OTHER): Payer: Managed Care, Other (non HMO) | Admitting: Certified Registered"

## 2022-05-09 DIAGNOSIS — G4733 Obstructive sleep apnea (adult) (pediatric): Secondary | ICD-10-CM | POA: Diagnosis not present

## 2022-05-09 DIAGNOSIS — Z79899 Other long term (current) drug therapy: Secondary | ICD-10-CM | POA: Insufficient documentation

## 2022-05-09 DIAGNOSIS — Z9989 Dependence on other enabling machines and devices: Secondary | ICD-10-CM

## 2022-05-09 DIAGNOSIS — I10 Essential (primary) hypertension: Secondary | ICD-10-CM | POA: Diagnosis not present

## 2022-05-09 DIAGNOSIS — G4730 Sleep apnea, unspecified: Secondary | ICD-10-CM | POA: Insufficient documentation

## 2022-05-09 DIAGNOSIS — Z01818 Encounter for other preprocedural examination: Secondary | ICD-10-CM

## 2022-05-09 DIAGNOSIS — G5601 Carpal tunnel syndrome, right upper limb: Secondary | ICD-10-CM | POA: Diagnosis present

## 2022-05-09 HISTORY — PX: CARPAL TUNNEL RELEASE: SHX101

## 2022-05-09 SURGERY — CARPAL TUNNEL RELEASE
Anesthesia: General | Site: Wrist | Laterality: Right

## 2022-05-09 MED ORDER — BUPIVACAINE HCL (PF) 0.25 % IJ SOLN
INTRAMUSCULAR | Status: DC | PRN
  Administered 2022-05-09: 9 mL

## 2022-05-09 MED ORDER — ACETAMINOPHEN 500 MG PO TABS
1000.0000 mg | ORAL_TABLET | Freq: Once | ORAL | Status: AC
  Administered 2022-05-09: 1000 mg via ORAL

## 2022-05-09 MED ORDER — FENTANYL CITRATE (PF) 100 MCG/2ML IJ SOLN
INTRAMUSCULAR | Status: DC | PRN
  Administered 2022-05-09 (×2): 25 ug via INTRAVENOUS

## 2022-05-09 MED ORDER — FENTANYL CITRATE (PF) 100 MCG/2ML IJ SOLN
INTRAMUSCULAR | Status: AC
  Filled 2022-05-09: qty 2

## 2022-05-09 MED ORDER — BUPIVACAINE HCL (PF) 0.25 % IJ SOLN
INTRAMUSCULAR | Status: AC
  Filled 2022-05-09: qty 30

## 2022-05-09 MED ORDER — CEFAZOLIN SODIUM-DEXTROSE 2-4 GM/100ML-% IV SOLN
2.0000 g | INTRAVENOUS | Status: AC
  Administered 2022-05-09: 2 g via INTRAVENOUS

## 2022-05-09 MED ORDER — ONDANSETRON HCL 4 MG/2ML IJ SOLN
INTRAMUSCULAR | Status: AC
  Filled 2022-05-09: qty 2

## 2022-05-09 MED ORDER — LIDOCAINE 2% (20 MG/ML) 5 ML SYRINGE
INTRAMUSCULAR | Status: AC
  Filled 2022-05-09: qty 5

## 2022-05-09 MED ORDER — KETOROLAC TROMETHAMINE 30 MG/ML IJ SOLN
30.0000 mg | Freq: Once | INTRAMUSCULAR | Status: AC
  Administered 2022-05-09: 30 mg via INTRAVENOUS

## 2022-05-09 MED ORDER — OXYCODONE HCL 5 MG PO TABS
ORAL_TABLET | ORAL | Status: AC
  Filled 2022-05-09: qty 1

## 2022-05-09 MED ORDER — LACTATED RINGERS IV SOLN: INTRAVENOUS | Status: DC

## 2022-05-09 MED ORDER — CEFAZOLIN SODIUM-DEXTROSE 2-4 GM/100ML-% IV SOLN
INTRAVENOUS | Status: AC
  Filled 2022-05-09: qty 100

## 2022-05-09 MED ORDER — ONDANSETRON HCL 4 MG/2ML IJ SOLN
INTRAMUSCULAR | Status: DC | PRN
  Administered 2022-05-09: 4 mg via INTRAVENOUS

## 2022-05-09 MED ORDER — OXYCODONE HCL 5 MG/5ML PO SOLN: 5.0000 mg | Freq: Once | ORAL | Status: AC | PRN

## 2022-05-09 MED ORDER — 0.9 % SODIUM CHLORIDE (POUR BTL) OPTIME
TOPICAL | Status: DC | PRN
  Administered 2022-05-09: 200 mL

## 2022-05-09 MED ORDER — PROPOFOL 500 MG/50ML IV EMUL
INTRAVENOUS | Status: DC | PRN
  Administered 2022-05-09: 150 ug/kg/min via INTRAVENOUS
  Administered 2022-05-09: 100 mg via INTRAVENOUS

## 2022-05-09 MED ORDER — FENTANYL CITRATE (PF) 100 MCG/2ML IJ SOLN: 25.0000 ug | INTRAMUSCULAR | Status: DC | PRN

## 2022-05-09 MED ORDER — AMISULPRIDE (ANTIEMETIC) 5 MG/2ML IV SOLN: 10.0000 mg | Freq: Once | INTRAVENOUS | Status: DC | PRN

## 2022-05-09 MED ORDER — KETOROLAC TROMETHAMINE 30 MG/ML IJ SOLN
INTRAMUSCULAR | Status: AC
  Filled 2022-05-09: qty 1

## 2022-05-09 MED ORDER — HYDROCODONE-ACETAMINOPHEN 5-325 MG PO TABS: ORAL_TABLET | ORAL | 0 refills | Status: DC

## 2022-05-09 MED ORDER — SUCCINYLCHOLINE CHLORIDE 200 MG/10ML IV SOSY
PREFILLED_SYRINGE | INTRAVENOUS | Status: DC | PRN
  Administered 2022-05-09: 120 mg via INTRAVENOUS

## 2022-05-09 MED ORDER — MIDAZOLAM HCL 2 MG/2ML IJ SOLN
INTRAMUSCULAR | Status: AC
  Filled 2022-05-09: qty 2

## 2022-05-09 MED ORDER — DEXAMETHASONE SODIUM PHOSPHATE 10 MG/ML IJ SOLN
INTRAMUSCULAR | Status: DC | PRN
  Administered 2022-05-09: 10 mg via INTRAVENOUS

## 2022-05-09 MED ORDER — SUCCINYLCHOLINE CHLORIDE 200 MG/10ML IV SOSY
PREFILLED_SYRINGE | INTRAVENOUS | Status: AC
  Filled 2022-05-09: qty 10

## 2022-05-09 MED ORDER — PROPOFOL 500 MG/50ML IV EMUL
INTRAVENOUS | Status: AC
  Filled 2022-05-09: qty 50

## 2022-05-09 MED ORDER — PROPOFOL 10 MG/ML IV BOLUS
INTRAVENOUS | Status: AC
  Filled 2022-05-09: qty 20

## 2022-05-09 MED ORDER — OXYCODONE HCL 5 MG PO TABS
5.0000 mg | ORAL_TABLET | Freq: Once | ORAL | Status: AC | PRN
  Administered 2022-05-09: 5 mg via ORAL

## 2022-05-09 MED ORDER — PROPOFOL 10 MG/ML IV BOLUS
INTRAVENOUS | Status: DC | PRN
  Administered 2022-05-09: 200 mg via INTRAVENOUS

## 2022-05-09 MED ORDER — MIDAZOLAM HCL 5 MG/5ML IJ SOLN
INTRAMUSCULAR | Status: DC | PRN
  Administered 2022-05-09: 2 mg via INTRAVENOUS

## 2022-05-09 MED ORDER — LIDOCAINE HCL (CARDIAC) PF 100 MG/5ML IV SOSY
PREFILLED_SYRINGE | INTRAVENOUS | Status: DC | PRN
  Administered 2022-05-09: 40 mg via INTRAVENOUS

## 2022-05-09 MED ORDER — ACETAMINOPHEN 500 MG PO TABS
ORAL_TABLET | ORAL | Status: AC
  Filled 2022-05-09: qty 2

## 2022-05-09 SURGICAL SUPPLY — 40 items
APL PRP STRL LF DISP 70% ISPRP (MISCELLANEOUS) ×1
BLADE SURG 15 STRL LF DISP TIS (BLADE) ×2 IMPLANT
BLADE SURG 15 STRL SS (BLADE) ×2
BNDG CMPR 5X3 KNIT ELC UNQ LF (GAUZE/BANDAGES/DRESSINGS) ×1
BNDG CMPR 9X4 STRL LF SNTH (GAUZE/BANDAGES/DRESSINGS) ×1
BNDG ELASTIC 3INX 5YD STR LF (GAUZE/BANDAGES/DRESSINGS) ×1 IMPLANT
BNDG ESMARK 4X9 LF (GAUZE/BANDAGES/DRESSINGS) IMPLANT
BNDG GAUZE DERMACEA FLUFF 4 (GAUZE/BANDAGES/DRESSINGS) ×1 IMPLANT
BNDG GZE DERMACEA 4 6PLY (GAUZE/BANDAGES/DRESSINGS) ×1
CHLORAPREP W/TINT 26 (MISCELLANEOUS) ×1 IMPLANT
CORD BIPOLAR FORCEPS 12FT (ELECTRODE) ×1 IMPLANT
COVER BACK TABLE 60X90IN (DRAPES) ×1 IMPLANT
COVER MAYO STAND STRL (DRAPES) ×1 IMPLANT
CUFF TOURN SGL QUICK 18X4 (TOURNIQUET CUFF) ×1 IMPLANT
DRAPE EXTREMITY T 121X128X90 (DISPOSABLE) ×1 IMPLANT
DRAPE SURG 17X23 STRL (DRAPES) ×1 IMPLANT
GAUZE PAD ABD 8X10 STRL (GAUZE/BANDAGES/DRESSINGS) ×1 IMPLANT
GAUZE SPONGE 4X4 12PLY STRL (GAUZE/BANDAGES/DRESSINGS) ×1 IMPLANT
GAUZE XEROFORM 1X8 LF (GAUZE/BANDAGES/DRESSINGS) ×1 IMPLANT
GLOVE BIO SURGEON STRL SZ7.5 (GLOVE) ×1 IMPLANT
GLOVE BIOGEL PI IND STRL 7.0 (GLOVE) IMPLANT
GLOVE BIOGEL PI IND STRL 7.5 (GLOVE) IMPLANT
GLOVE BIOGEL PI IND STRL 8 (GLOVE) ×1 IMPLANT
GLOVE SURG SYN 7.5  E (GLOVE) ×1
GLOVE SURG SYN 7.5 E (GLOVE) ×1 IMPLANT
GLOVE SURG SYN 7.5 PF PI (GLOVE) IMPLANT
GOWN STRL REUS W/ TWL LRG LVL3 (GOWN DISPOSABLE) ×1 IMPLANT
GOWN STRL REUS W/TWL LRG LVL3 (GOWN DISPOSABLE) ×1
GOWN STRL REUS W/TWL XL LVL3 (GOWN DISPOSABLE) ×1 IMPLANT
NDL HYPO 25X1 1.5 SAFETY (NEEDLE) ×1 IMPLANT
NEEDLE HYPO 25X1 1.5 SAFETY (NEEDLE) ×1 IMPLANT
NS IRRIG 1000ML POUR BTL (IV SOLUTION) ×1 IMPLANT
PACK BASIN DAY SURGERY FS (CUSTOM PROCEDURE TRAY) ×1 IMPLANT
PADDING CAST ABS COTTON 4X4 ST (CAST SUPPLIES) ×1 IMPLANT
STOCKINETTE 4X48 STRL (DRAPES) ×1 IMPLANT
SUT ETHILON 4 0 PS 2 18 (SUTURE) ×1 IMPLANT
SYR BULB EAR ULCER 3OZ GRN STR (SYRINGE) ×1 IMPLANT
SYR CONTROL 10ML LL (SYRINGE) ×1 IMPLANT
TOWEL GREEN STERILE FF (TOWEL DISPOSABLE) ×2 IMPLANT
UNDERPAD 30X36 HEAVY ABSORB (UNDERPADS AND DIAPERS) ×1 IMPLANT

## 2022-05-09 NOTE — Discharge Instructions (Addendum)

## 2022-05-09 NOTE — H&P (Signed)
Wanda Williams is an 52 y.o. female.   Chief Complaint: carpal tunnel syndrome HPI: 52 y.o. yo female with numbness and tingling right hand.  Nocturnal symptoms. Positive nerve conduction studies. She wishes to have right carpal tunnel release.   Allergies:  Allergies  Allergen Reactions   Ace Inhibitors Cough    Past Medical History:  Diagnosis Date   Allergy    Angio-edema    Anxiety    Depression    Eczema    HTN (hypertension)    Sleep apnea    uses CPAP nightly    Past Surgical History:  Procedure Laterality Date   BREAST LUMPECTOMY WITH RADIOACTIVE SEED LOCALIZATION Left 11/27/2018   Procedure: RADIOACTIVE SEED GUIDED LEFT BREAST LUMPECTOMY;  Surgeon: Erroll Luna, MD;  Location: [facility];  Service: General;  Laterality: Left;   CESAREAN SECTION     x 2   DILATION AND CURETTAGE OF UTERUS     THYROID CYST EXCISION     TUBAL LIGATION  2006    Family History: Family History  Problem Relation Age of Onset   Heart disease Father        heart murmur   Parkinson's disease Father    Hypertension Mother    Coronary artery disease Paternal Uncle        2 uncles (both died before 37)   Coronary artery disease Paternal Aunt        MI, died before 39   Melanoma Brother 88       melanoma   Allergic rhinitis Sister    Colon cancer Neg Hx    Rectal cancer Neg Hx    Stomach cancer Neg Hx    Esophageal cancer Neg Hx     Social History:   reports that she has never smoked. She has never used smokeless tobacco. She reports current alcohol use. She reports that she does not use drugs.  Medications: Medications Prior to Admission  Medication Sig Dispense Refill   amLODipine (NORVASC) 2.5 MG tablet TAKE 1 TABLET BY MOUTH DAILY 90 tablet 1   aspirin-acetaminophen-caffeine (EXCEDRIN MIGRAINE) 250-250-65 MG tablet Take 1-2 tablets by mouth every 6 (six) hours as needed for headache or migraine.      buPROPion (WELLBUTRIN XL) 150 MG 24 hr tablet TAKE 1  TABLET BY MOUTH DAILY 90 tablet 1   gabapentin (NEURONTIN) 300 MG capsule Take 1 capsule (300 mg total) by mouth 2 (two) times daily. 60 capsule 1   Multiple Vitamins-Minerals (MULTIVITAMIN WITH MINERALS) tablet Take 1 tablet by mouth at bedtime.      valsartan-hydrochlorothiazide (DIOVAN-HCT) 160-25 MG tablet TAKE 1 TABLET BY MOUTH DAILY 90 tablet 1   venlafaxine XR (EFFEXOR-XR) 150 MG 24 hr capsule TAKE 1 CAPSULE BY MOUTH DAILY  WITH BREAKFAST 90 capsule 1   triamcinolone (KENALOG) 0.025 % ointment Apply 1 application topically as needed.       No results found for this or any previous visit (from the past 48 hour(s)).  No results found.    Blood pressure (!) 144/92, pulse 71, temperature (!) 97.5 F (36.4 C), temperature source Tympanic, resp. rate 18, height '4\' 10"'$  (1.473 m), weight 90.7 kg, last menstrual period 04/20/2016, SpO2 98 %.  General appearance: alert, cooperative, and appears stated age Head: Normocephalic, without obvious abnormality, atraumatic Neck: supple, symmetrical, trachea midline Extremities: Intact sensation and capillary refill all digits.  +epl/fpl/io.  No wounds.  Pulses: 2+ and symmetric Skin: Skin color, texture, turgor normal. No rashes  or lesions Neurologic: Grossly normal Incision/Wound: none  Assessment/Plan Right carpal tunnel syndrome.  Non operative and operative treatment options have been discussed with the patient and patient wishes to proceed with operative treatment. Risks, benefits, and alternatives of surgery have been discussed and the patient agrees with the plan of care.   Leanora Cover 05/09/2022, 1:31 PM

## 2022-05-09 NOTE — Anesthesia Procedure Notes (Addendum)
Procedure Name: Intubation Date/Time: 05/09/2022 1:41 PM  Performed by: Suzette Battiest, MDPre-anesthesia Checklist: Patient identified, Emergency Drugs available, Suction available and Patient being monitored Patient Re-evaluated:Patient Re-evaluated prior to induction Oxygen Delivery Method: Circle system utilized Preoxygenation: Pre-oxygenation with 100% oxygen Induction Type: IV induction Ventilation: Mask ventilation without difficulty Laryngoscope Size: Glidescope, 3 and 4 Grade View: Grade I Tube type: Oral Tube size: 7.0 mm Number of attempts: 1 Airway Equipment and Method: Stylet and Oral airway Placement Confirmation: ETT inserted through vocal cords under direct vision, positive ETCO2 and breath sounds checked- equal and bilateral Secured at: 22 cm Tube secured with: Tape Dental Injury: Teeth and Oropharynx as per pre-operative assessment  Difficulty Due To: Difficulty was anticipated and Difficult Airway- due to large tongue

## 2022-05-09 NOTE — Transfer of Care (Signed)
Immediate Anesthesia Transfer of Care Note  Patient: Wanda Williams  Procedure(s) Performed: RIGHT CARPAL TUNNEL RELEASE (Right: Wrist)  Patient Location: PACU  Anesthesia Type:General  Level of Consciousness: drowsy  Airway & Oxygen Therapy: Patient Spontanous Breathing and Patient connected to face mask oxygen  Post-op Assessment: Report given to RN and Post -op Vital signs reviewed and stable  Post vital signs: Reviewed and stable  Last Vitals:  Vitals Value Taken Time  BP 129/83 (97)   Temp    Pulse 74 05/09/22 1424  Resp 16   SpO2 99 % 05/09/22 1424  Vitals shown include unvalidated device data.  Last Pain:  Vitals:   05/09/22 1154  TempSrc: Tympanic  PainSc: 2       Patients Stated Pain Goal: 5 ([id number])  Complications:  Encounter Notable Events  Notable Event Outcome Phase Comment  Difficult to intubate - expected  Intraprocedure Filed from anesthesia note documentation.

## 2022-05-09 NOTE — Anesthesia Preprocedure Evaluation (Signed)
Anesthesia Evaluation  Patient identified by MRN, date of birth, ID band Patient awake    Reviewed: Allergy & Precautions, NPO status , Patient's Chart, lab work & pertinent test results  Airway Mallampati: III  TM Distance: >3 FB Neck ROM: Full    Dental  (+) Dental Advisory Given   Pulmonary sleep apnea and Continuous Positive Airway Pressure Ventilation    breath sounds clear to auscultation       Cardiovascular hypertension, Pt. on medications  Rhythm:Regular Rate:Normal     Neuro/Psych negative neurological ROS     GI/Hepatic negative GI ROS, Neg liver ROS,,,  Endo/Other  negative endocrine ROS    Renal/GU negative Renal ROS     Musculoskeletal   Abdominal   Peds  Hematology negative hematology ROS (+)   Anesthesia Other Findings   Reproductive/Obstetrics                             Anesthesia Physical Anesthesia Plan  ASA: 3  Anesthesia Plan: General   Post-op Pain Management: Tylenol PO (pre-op)* and Toradol IV (intra-op)*   Induction: Intravenous  PONV Risk Score and Plan: 3 and Propofol infusion, TIVA, Midazolam, Dexamethasone, Ondansetron and Treatment may vary due to age or medical condition  Airway Management Planned: LMA  Additional Equipment:   Intra-op Plan:   Post-operative Plan: Extubation in OR  Informed Consent: I have reviewed the patients History and Physical, chart, labs and discussed the procedure including the risks, benefits and alternatives for the proposed anesthesia with the patient or authorized representative who has indicated his/her understanding and acceptance.     Dental advisory given  Plan Discussed with: CRNA  Anesthesia Plan Comments:        Anesthesia Quick Evaluation

## 2022-05-09 NOTE — Op Note (Signed)
05/09/2022 [facility]                              OPERATIVE REPORT   PREOPERATIVE DIAGNOSIS:  Right carpal tunnel syndrome.  POSTOPERATIVE DIAGNOSIS:  Right carpal tunnel syndrome.  PROCEDURE:  Right carpal tunnel release.  SURGEON:  Leanora Cover, MD  ASSISTANT:  none.  ANESTHESIA: General  IV FLUIDS:  Per anesthesia flow sheet.  ESTIMATED BLOOD LOSS:  Minimal.  COMPLICATIONS:  None.  SPECIMENS:  None.  TOURNIQUET TIME:    Total Tourniquet Time Documented: Forearm (Right) - 13 minutes Total: Forearm (Right) - 13 minutes   DISPOSITION:  Stable to PACU.  LOCATION: [facility]  INDICATIONS:  52 y.o. yo female with numbness and tingling right hand.  Nocturnal symptoms. Positive nerve conduction studies. She wishes to have right carpal tunnel release.  She wishes to have a carpal tunnel release for management of her symptoms.  Risks, benefits and alternatives of surgery were discussed including the risk of blood loss; infection; damage to nerves, vessels, tendons, ligaments, bone; failure of surgery; need for additional surgery; complications with wound healing; continued pain; recurrence of carpal tunnel syndrome; and damage to motor branch. She voiced understanding of these risks and elected to proceed.   OPERATIVE COURSE:  After being identified preoperatively by myself, the patient and I agreed upon the procedure and site of procedure.  The surgical site was marked.  Surgical consent had been signed.  She was given IV Ancef as preoperative antibiotic prophylaxis.  She was transferred to the operating room and placed on the operating room table in supine position with the Right upper extremity on an armboard.  General anesthesia was induced by the anesthesiologist.  Right upper extremity was prepped and draped in normal sterile orthopaedic fashion.  A surgical pause was performed between the surgeons, anesthesia, and operating room staff, and all were  in agreement as to the patient, procedure, and site of procedure.  Tourniquet at the proximal aspect of the extremity was inflated to 250 mmHg after exsanguination of the arm with an Esmarch bandage  Incision was made over the transverse carpal ligament and carried into the subcutaneous tissues by spreading technique.  Bipolar electrocautery was used to obtain hemostasis.  The palmar fascia was sharply incised.  The transverse carpal ligament was identified.  The fascia distal to the ligament was opened.  Retractor was placed and the flexor tendons were identified.  The flexor tendon to the ring finger was identified and retracted radially.  The transverse carpal ligament was then incised from distal to proximal under direct visualization.  Scissors were used to split the distal aspect of the volar antebrachial fascia.  A finger was placed into the wound to ensure complete decompression, which was the case.  The nerve was examined.  It was adherent to the radial leaflet.  The motor branch was identified and was intact.  The wound was copiously irrigated with sterile saline.  It was then closed with 4-0 nylon in a horizontal mattress fashion.  It was injected with 0.25% plain Marcaine to aid in postoperative analgesia.  It was dressed with sterile Xeroform, 4x4s, an ABD, and wrapped with Kerlix and an Ace bandage.  Tourniquet was deflated at 13 minutes.  Fingertips were pink with brisk capillary refill after deflation of the tourniquet.  Operative drapes were broken down.  The patient was awoken from anesthesia safely.  She was  transferred back to stretcher and taken to the PACU in stable condition.  I will see her back in the office in 1 week for postoperative followup.  I will give her a prescription for Tramadol 50 mg 1-2 tabs PO q6 hours prn pain, dispense # 20.    Leanora Cover, MD Electronically signed, 05/09/22

## 2022-05-10 ENCOUNTER — Encounter (HOSPITAL_BASED_OUTPATIENT_CLINIC_OR_DEPARTMENT_OTHER): Payer: Self-pay | Admitting: Orthopedic Surgery

## 2022-05-10 NOTE — Anesthesia Postprocedure Evaluation (Signed)
Anesthesia Post Note  Patient: Larraine Langworthy  Procedure(s) Performed: RIGHT CARPAL TUNNEL RELEASE (Right: Wrist)     Patient location during evaluation: PACU Anesthesia Type: General Level of consciousness: awake and alert Pain management: pain level controlled Vital Signs Assessment: post-procedure vital signs reviewed and stable Respiratory status: spontaneous breathing, nonlabored ventilation, respiratory function stable and patient connected to nasal cannula oxygen Cardiovascular status: blood pressure returned to baseline and stable Postop Assessment: no apparent nausea or vomiting Anesthetic complications: yes   Encounter Notable Events  Notable Event Outcome Phase Comment  Difficult to intubate - expected  Intraprocedure Filed from anesthesia note documentation.    Last Vitals:  Vitals:   05/09/22 1500 05/09/22 1521  BP: (!) 141/90 (!) 134/92  Pulse: 65 66  Resp: 14 20  Temp:  (!) 36.3 C  SpO2: 98% 99%    Last Pain:  Vitals:   05/09/22 1521  TempSrc: Temporal  PainSc: 2                  Tiajuana Amass

## 2022-05-10 NOTE — Progress Notes (Signed)
Left message stating courtesy call and if any questions or concerns please call the doctors office.  

## 2022-05-17 ENCOUNTER — Encounter: Payer: Managed Care, Other (non HMO) | Admitting: Family

## 2022-05-18 ENCOUNTER — Other Ambulatory Visit (HOSPITAL_COMMUNITY)
Admission: RE | Admit: 2022-05-18 | Discharge: 2022-05-18 | Disposition: A | Discharge status: Home / Self Care | Payer: Managed Care, Other (non HMO) | Source: Ambulatory Visit | Attending: Family | Admitting: Family

## 2022-05-18 ENCOUNTER — Ambulatory Visit (INDEPENDENT_AMBULATORY_CARE_PROVIDER_SITE_OTHER): Payer: Managed Care, Other (non HMO) | Admitting: Family

## 2022-05-18 VITALS — BP 139/72 | HR 82 | Temp 98.1°F | Resp 16 | Wt 200.0 lb

## 2022-05-18 DIAGNOSIS — N760 Acute vaginitis: Secondary | ICD-10-CM

## 2022-05-18 DIAGNOSIS — R8290 Unspecified abnormal findings in urine: Secondary | ICD-10-CM | POA: Diagnosis not present

## 2022-05-18 LAB — POC URINALSYSI DIPSTICK (AUTOMATED)
Blood, UA: NEGATIVE
Glucose, UA: NEGATIVE
Ketones, UA: NEGATIVE
Nitrite, UA: POSITIVE
Protein, UA: POSITIVE — AB
Spec Grav, UA: 1.02 (ref 1.010–1.025)
Urobilinogen, UA: 0.2 E.U./dL
pH, UA: 6 (ref 5.0–8.0)

## 2022-05-18 MED ORDER — FLUCONAZOLE 150 MG PO TABS: ORAL_TABLET | ORAL | 0 refills | Status: DC

## 2022-05-18 NOTE — Progress Notes (Signed)
Subjective:   By signing my name below, I, Jamey Reas, attest that this documentation has been prepared under the direction and in the presence of [name], NP. 05/18/2022   Patient ID: Wanda Williams, female    DOB: 1970-11-26, 52 y.o.   MRN: [id number]  Chief Complaint  Patient presents with   Urine odor    Patient complains of "strong urine odor"   Vaginal Itching    Complains of vaginal itching     HPI Patient is in today for an office visit. She is accompanied by her husband during this visit.   Vaginal itching: She complains of vaginal itching, especially on the top and inside of her vagina. She reports there is an odor associated. She denies any burning.  Past Medical History:  Diagnosis Date   Allergy    Angio-edema    Anxiety    Depression    Eczema    HTN (hypertension)    Sleep apnea    uses CPAP nightly    Past Surgical History:  Procedure Laterality Date   BREAST LUMPECTOMY WITH RADIOACTIVE SEED LOCALIZATION Left 11/27/2018   Procedure: RADIOACTIVE SEED GUIDED LEFT BREAST LUMPECTOMY;  Surgeon: Erroll Luna, MD;  Location: [place];  Service: General;  Laterality: Left;   CARPAL TUNNEL RELEASE Right 05/09/2022   Procedure: RIGHT CARPAL TUNNEL RELEASE;  Surgeon: Leanora Cover, MD;  Location: [place];  Service: Orthopedics;  Laterality: Right;  22  MIN   CESAREAN SECTION     x 2   DILATION AND CURETTAGE OF UTERUS     THYROID CYST EXCISION     TUBAL LIGATION  2006    Family History  Problem Relation Age of Onset   Heart disease Father        heart murmur   Parkinson's disease Father    Hypertension Mother    Coronary artery disease Paternal Uncle        2 uncles (both died before 23)   Coronary artery disease Paternal Aunt        MI, died before 69   Melanoma Brother 60       melanoma   Allergic rhinitis Sister    Colon cancer Neg Hx    Rectal cancer Neg Hx    Stomach cancer Neg Hx    Esophageal  cancer Neg Hx     Social History   Socioeconomic History   Marital status: Married    Spouse name: Not on file   Number of children: Not on file   Years of education: Not on file   Highest education level: Not on file  Occupational History   Not on file  Tobacco Use   Smoking status: Never   Smokeless tobacco: Never  Vaping Use   Vaping Use: Never used  Substance and Sexual Activity   Alcohol use: Yes    Alcohol/week: 0.0 standard drinks of alcohol    Comment: occasional wine   Drug use: No   Sexual activity: Yes    Birth control/protection: Surgical, Post-menopausal    Comment: BTL  Other Topics Concern   Not on file  Social History Narrative   Works at [employer]   One daughter age 18   One daughter is age 23   Enjoys cross stitching, playing games on her cell phone.     2 dogs   Completed 12th grade.    Social Determinants of Health   Financial Resource Strain: Not on  file  Food Insecurity: Not on file  Transportation Needs: Not on file  Physical Activity: Not on file  Stress: Not on file  Social Connections: Not on file  Intimate Partner Violence: Not on file    Outpatient Medications Prior to Visit  Medication Sig Dispense Refill   amLODipine (NORVASC) 2.5 MG tablet TAKE 1 TABLET BY MOUTH DAILY 90 tablet 1   buPROPion (WELLBUTRIN XL) 150 MG 24 hr tablet TAKE 1 TABLET BY MOUTH DAILY 90 tablet 1   gabapentin (NEURONTIN) 300 MG capsule Take 1 capsule (300 mg total) by mouth 2 (two) times daily. 60 capsule 1   HYDROcodone-acetaminophen (NORCO/VICODIN) 5-325 MG tablet 1-2 tabs PO q6 hours prn pain 15 tablet 0   Multiple Vitamins-Minerals (MULTIVITAMIN WITH MINERALS) tablet Take 1 tablet by mouth at bedtime.      triamcinolone (KENALOG) 0.025 % ointment Apply 1 application topically as needed.      valsartan-hydrochlorothiazide (DIOVAN-HCT) 160-25 MG tablet TAKE 1 TABLET BY MOUTH DAILY 90 tablet 1   venlafaxine XR (EFFEXOR-XR) 150 MG 24 hr capsule TAKE 1  CAPSULE BY MOUTH DAILY  WITH BREAKFAST 90 capsule 1   No facility-administered medications prior to visit.    Allergies  Allergen Reactions   Ace Inhibitors Cough    Review of Systems  Genitourinary:        (+) vaginal itching (+) vaginal odor  (-) burning upon urination       Objective:    Physical Exam Constitutional:      General: She is not in acute distress.    Appearance: Normal appearance.  HENT:     Head: Normocephalic and atraumatic.     Right Ear: External ear normal.     Left Ear: External ear normal.  Eyes:     Extraocular Movements: Extraocular movements intact.     Pupils: Pupils are equal, round, and reactive to light.  Cardiovascular:     Rate and Rhythm: Normal rate and regular rhythm.     Heart sounds: Normal heart sounds. No murmur heard.    No gallop.  Pulmonary:     Effort: Pulmonary effort is normal. No respiratory distress.     Breath sounds: Normal breath sounds. No wheezing or rales.  Genitourinary:    Comments: (+) mild redness on vaginal exterior  Skin:    General: Skin is warm.  Neurological:     Mental Status: She is alert and oriented to person, place, and time.  Psychiatric:        Judgment: Judgment normal.     BP 139/72 (BP Location: Left Arm, Patient Position: Sitting, Cuff Size: Large)   Pulse 82   Temp 98.1 F (36.7 C) (Oral)   Resp 16   Wt 200 lb (90.7 kg)   LMP 04/20/2016 (LMP Unknown) Comment: BTL  SpO2 100%   BMI 41.80 kg/m  Wt Readings from Last 3 Encounters:  05/18/22 200 lb (90.7 kg)  05/09/22 199 lb 15.3 oz (90.7 kg)  04/06/22 199 lb 3.2 oz (90.4 kg)       Assessment & Plan:  Abnormal urine odor -     POCT Urinalysis Dipstick (Automated) -     Urine Culture  Vulvovaginitis Assessment & Plan: New.  Will rx with empiric diflucan. Await formal results from [facility] swab.   Orders: -     Cervicovaginal ancillary only  Other orders -     Fluconazole; Take 1 tablet by mouth now. May repeat in 3 days if  symptoms  are not resolved.  Dispense: 2 tablet; Refill: 0    I, [name], NP, personally preformed the services described in this documentation.  All medical record entries made by the scribe were at my direction and in my presence.  I have reviewed the chart and discharge instructions (if applicable) and agree that the record reflects my personal performance and is accurate and complete. 05/18/2022  [name], NP  Harvest Dark as a scribe for [name], NP.,have documented all relevant documentation on the behalf of [name], NP,as directed by  [name], NP while in the presence of [name], NP.

## 2022-05-18 NOTE — Assessment & Plan Note (Signed)
Will send urine for culture. Plan to treat if + culture.

## 2022-05-18 NOTE — Assessment & Plan Note (Signed)
New.  Will rx with empiric diflucan. Await formal results from [facility] swab.

## 2022-05-19 LAB — CERVICOVAGINAL ANCILLARY ONLY
Bacterial Vaginitis (gardnerella): NEGATIVE
Candida Glabrata: NEGATIVE
Candida Vaginitis: POSITIVE — AB
Comment: NEGATIVE
Comment: NEGATIVE
Comment: NEGATIVE

## 2022-05-20 ENCOUNTER — Telehealth: Payer: Self-pay | Admitting: Family

## 2022-05-20 LAB — URINE CULTURE
MICRO NUMBER:: 14687150
SPECIMEN QUALITY:: ADEQUATE

## 2022-05-20 MED ORDER — NITROFURANTOIN MONOHYD MACRO 100 MG PO CAPS: 100.0000 mg | ORAL_CAPSULE | Freq: Two times a day (BID) | ORAL | 0 refills | Status: DC

## 2022-05-20 NOTE — Telephone Encounter (Signed)
Advised pt that her culture is positive for UTI and to begin macrobid. Also + yeast infection which has been treated with diflucan already. She will let us know if symptoms do not improve.

## 2022-08-03 ENCOUNTER — Other Ambulatory Visit: Payer: Self-pay | Admitting: Family

## 2022-08-03 DIAGNOSIS — Z1231 Encounter for screening mammogram for malignant neoplasm of breast: Secondary | ICD-10-CM

## 2022-08-19 ENCOUNTER — Telehealth (HOSPITAL_BASED_OUTPATIENT_CLINIC_OR_DEPARTMENT_OTHER): Payer: Self-pay

## 2022-09-06 ENCOUNTER — Other Ambulatory Visit (HOSPITAL_BASED_OUTPATIENT_CLINIC_OR_DEPARTMENT_OTHER): Payer: Self-pay

## 2022-09-06 ENCOUNTER — Ambulatory Visit (INDEPENDENT_AMBULATORY_CARE_PROVIDER_SITE_OTHER): Payer: Managed Care, Other (non HMO) | Admitting: Family

## 2022-09-06 ENCOUNTER — Encounter: Payer: Self-pay | Admitting: Family

## 2022-09-06 VITALS — BP 149/88 | HR 85 | Temp 98.0°F | Resp 16 | Ht <= 58 in | Wt 203.0 lb

## 2022-09-06 DIAGNOSIS — H6693 Otitis media, unspecified, bilateral: Secondary | ICD-10-CM | POA: Diagnosis not present

## 2022-09-06 DIAGNOSIS — I10 Essential (primary) hypertension: Secondary | ICD-10-CM | POA: Diagnosis not present

## 2022-09-06 MED ORDER — BUPROPION HCL ER (XL) 150 MG PO TB24: 150.0000 mg | ORAL_TABLET | Freq: Every day | ORAL | 5 refills | Status: DC

## 2022-09-06 MED ORDER — AMOXICILLIN 500 MG PO CAPS
500.0000 mg | ORAL_CAPSULE | Freq: Three times a day (TID) | ORAL | 0 refills | Status: AC
  Filled 2022-09-06: qty 30, 10d supply, fill #0

## 2022-09-09 DIAGNOSIS — H6693 Otitis media, unspecified, bilateral: Secondary | ICD-10-CM | POA: Insufficient documentation

## 2022-09-09 NOTE — Patient Instructions (Signed)
VISIT SUMMARY:  During your visit, we discussed your recent symptoms of left-sided throat pain and pressure radiating to your left ear, which started yesterday. You also mentioned a constant ringing in your ear and decreased hearing. We also talked about your concerns regarding the affordability of your medications, and your reduced need for Gabapentin following your successful hand surgery.  YOUR PLAN:  -EAR INFECTION: Your symptoms and examination suggest you may have an ear infection. This is a common condition where the middle ear becomes inflamed and filled with fluid, causing pain and decreased hearing.  -MEDICATION AFFORDABILITY: You mentioned having difficulty affording your medications, which has led to inconsistent use. It's important to take your medications as prescribed for them to be effective.  -POST-SURGICAL PAIN MANAGEMENT: You reported a reduced need for Gabapentin following your successful hand surgery. Gabapentin is a medication used to relieve nerve pain.  INSTRUCTIONS:  I have prescribed Amoxicillin for your ear infection. Please take it as directed. We also discussed using GoodRx coupons to help with the cost of your medications. Please continue to take your Gabapentin at your current self-adjusted dose of 1 per day.

## 2022-09-09 NOTE — Assessment & Plan Note (Signed)
New. Will rx with amoxicillin.  

## 2022-09-09 NOTE — Assessment & Plan Note (Signed)
BP Readings from Last 3 Encounters:  09/06/22 (!) 149/88  05/18/22 139/72  05/09/22 (!) 134/92   BP is above goal. She has not been taking her amlodipine regularly due to cost.  I will refer her to our pharmacist to look into patient assistance.

## 2022-09-09 NOTE — Progress Notes (Signed)
Subjective:     Patient ID: Wanda Williams, female    DOB: [birth year], 52 y.o.   MRN: [id number]  Chief Complaint  Patient presents with   Ear Pain    X1 day, Pressure in L ear, trouble hearing out of L ear Gland swelling?     HPI  Discussed the use of AI scribe software for clinical note transcription with the patient, who gave verbal consent to proceed.  History of Present Illness   The patient, with a history of obstructive sleep apnea managed with CPAP, presents with a one day history of left sided throat pain and pressure radiating to the left ear. She describes the ear pressure as causing a 'constant ringing' and decreased hearing. She denies otalgia. She has minimal nasal drainage and denies fever. She has not been exposed to anyone with COVID-19. She also reports difficulty affording her medications and has been rationing them.   She underwent a right carpal tunnel release in March which was successful.          Health Maintenance Due  Topic Date Due   COVID-19 Vaccine (3 - 2023-24 season) 11/05/2021   MAMMOGRAM  04/28/2022    Past Medical History:  Diagnosis Date   Allergy    Angio-edema    Anxiety    Depression    Eczema    HTN (hypertension)    Sleep apnea    uses CPAP nightly    Past Surgical History:  Procedure Laterality Date   BREAST LUMPECTOMY WITH RADIOACTIVE SEED LOCALIZATION Left 11/27/2018   Procedure: RADIOACTIVE SEED GUIDED LEFT BREAST LUMPECTOMY;  Surgeon: Harriette Bouillon, MD;  Location: [facility];  Service: General;  Laterality: Left;   CARPAL TUNNEL RELEASE Right 05/09/2022   Procedure: RIGHT CARPAL TUNNEL RELEASE;  Surgeon: Betha Loa, MD;  Location: [facility];  Service: Orthopedics;  Laterality: Right;  30  MIN   CESAREAN SECTION     x 2   DILATION AND CURETTAGE OF UTERUS     THYROID CYST EXCISION     TUBAL LIGATION  2006    Family History  Problem Relation Age of Onset   Heart disease Father         heart murmur   Parkinson's disease Father    Hypertension Mother    Coronary artery disease Paternal Uncle        2 uncles (both died before 48)   Coronary artery disease Paternal Aunt        MI, died before 66   Melanoma Brother 34       melanoma   Allergic rhinitis Sister    Colon cancer Neg Hx    Rectal cancer Neg Hx    Stomach cancer Neg Hx    Esophageal cancer Neg Hx     Social History   Socioeconomic History   Marital status: Married    Spouse name: Not on file   Number of children: Not on file   Years of education: Not on file   Highest education level: Not on file  Occupational History   Not on file  Tobacco Use   Smoking status: Never   Smokeless tobacco: Never  Vaping Use   Vaping Use: Never used  Substance and Sexual Activity   Alcohol use: Yes    Alcohol/week: 0.0 standard drinks of alcohol    Comment: occasional wine   Drug use: No   Sexual activity: Yes    Birth control/protection: Surgical, Post-menopausal  Comment: BTL  Other Topics Concern   Not on file  Social History Narrative   Works at [employer]   One daughter age 78   One daughter is age 50   Enjoys cross stitching, playing games on her cell phone.     2 dogs   Completed 12th grade.    Social Determinants of Health   Financial Resource Strain: Not on file  Food Insecurity: Not on file  Transportation Needs: Not on file  Physical Activity: Not on file  Stress: Not on file  Social Connections: Not on file  Intimate Partner Violence: Not on file    Outpatient Medications Prior to Visit  Medication Sig Dispense Refill   amLODipine (NORVASC) 2.5 MG tablet TAKE 1 TABLET BY MOUTH DAILY 90 tablet 1   gabapentin (NEURONTIN) 300 MG capsule Take 1 capsule (300 mg total) by mouth 2 (two) times daily. 60 capsule 1   valsartan-hydrochlorothiazide (DIOVAN-HCT) 160-25 MG tablet TAKE 1 TABLET BY MOUTH DAILY 90 tablet 1   venlafaxine XR (EFFEXOR-XR) 150 MG 24 hr capsule TAKE 1 CAPSULE BY  MOUTH DAILY  WITH BREAKFAST 90 capsule 1   buPROPion (WELLBUTRIN XL) 150 MG 24 hr tablet TAKE 1 TABLET BY MOUTH DAILY 90 tablet 1   fluconazole (DIFLUCAN) 150 MG tablet Take 1 tablet by mouth now. May repeat in 3 days if symptoms are not resolved. (Patient not taking: Reported on 09/06/2022) 2 tablet 0   HYDROcodone-acetaminophen (NORCO/VICODIN) 5-325 MG tablet 1-2 tabs PO q6 hours prn pain (Patient not taking: Reported on 09/06/2022) 15 tablet 0   Multiple Vitamins-Minerals (MULTIVITAMIN WITH MINERALS) tablet Take 1 tablet by mouth at bedtime.  (Patient not taking: Reported on 09/06/2022)     nitrofurantoin, macrocrystal-monohydrate, (MACROBID) 100 MG capsule Take 1 capsule (100 mg total) by mouth 2 (two) times daily. (Patient not taking: Reported on 09/06/2022) 10 capsule 0   triamcinolone (KENALOG) 0.025 % ointment Apply 1 application topically as needed.  (Patient not taking: Reported on 09/06/2022)     No facility-administered medications prior to visit.    Allergies  Allergen Reactions   Ace Inhibitors Cough    ROS     See HPI     Physical Exam Constitutional:      General: She is not in acute distress.    Appearance: Normal appearance. She is well-developed.  HENT:     Head: Normocephalic and atraumatic.     Right Ear: External ear normal. Tympanic membrane is erythematous. Tympanic membrane is not bulging.     Left Ear: External ear normal. Tympanic membrane is not erythematous or bulging.     Mouth/Throat:     Lips: No lesions.     Mouth: Mucous membranes are moist.     Pharynx: No pharyngeal swelling, oropharyngeal exudate or posterior oropharyngeal erythema.  Eyes:     General: No scleral icterus. Neck:     Thyroid: No thyromegaly.  Cardiovascular:     Rate and Rhythm: Normal rate and regular rhythm.     Heart sounds: Normal heart sounds. No murmur heard. Pulmonary:     Effort: Pulmonary effort is normal. No respiratory distress.     Breath sounds: Normal breath sounds.  No wheezing.  Musculoskeletal:     Cervical back: Neck supple.  Skin:    General: Skin is warm and dry.  Neurological:     Mental Status: She is alert and oriented to person, place, and time.  Psychiatric:  Mood and Affect: Mood normal.        Behavior: Behavior normal.        Thought Content: Thought content normal.        Judgment: Judgment normal.      BP (!) 149/88   Pulse 85   Temp 98 F (36.7 C) (Oral)   Resp 16   Ht 4\' 10"  (1.473 m)   Wt 203 lb (92.1 kg)   LMP 04/20/2016 (LMP Unknown) Comment: BTL  SpO2 96%   BMI 42.43 kg/m  Wt Readings from Last 3 Encounters:  09/06/22 203 lb (92.1 kg)  05/18/22 200 lb (90.7 kg)  05/09/22 199 lb 15.3 oz (90.7 kg)       Assessment & Plan:   Problem List Items Addressed This Visit       Unprioritized   Essential hypertension    BP Readings from Last 3 Encounters:  09/06/22 (!) 149/88  05/18/22 139/72  05/09/22 (!) 134/92  BP is above goal. She has not been taking her amlodipine regularly due to cost.  I will refer her to our pharmacist to look into patient assistance.       Relevant Orders   AMB Referral to Pharmacy Medication Management   Bilateral otitis media - Primary    New. Will rx with amoxicillin.       Relevant Medications   amoxicillin (AMOXIL) 500 MG capsule    I have discontinued Priyah J. Deckman's multivitamin with minerals, triamcinolone, HYDROcodone-acetaminophen, fluconazole, and nitrofurantoin (macrocrystal-monohydrate). I have also changed her buPROPion. Additionally, I am having her start on amoxicillin. Lastly, I am having her maintain her gabapentin, venlafaxine XR, valsartan-hydrochlorothiazide, and amLODipine.  Meds ordered this encounter  Medications   amoxicillin (AMOXIL) 500 MG capsule    Sig: Take 1 capsule (500 mg total) by mouth 3 (three) times daily for 10 days.    Dispense:  30 capsule    Refill:  0    Order Specific Question:   Supervising Provider    Answer:   Danise Edge A [[id number]]   buPROPion (WELLBUTRIN XL) 150 MG 24 hr tablet    Sig: Take 1 tablet (150 mg total) by mouth daily.    Dispense:  30 tablet    Refill:  5    Please send a replace/new response with 90-Day Supply if appropriate to maximize member benefit. Requesting 1 year supply.    Order Specific Question:   Supervising Provider    Answer:   Danise Edge A [[id number]]

## 2022-09-12 ENCOUNTER — Telehealth: Payer: Self-pay

## 2022-09-12 NOTE — Progress Notes (Signed)
   Care Guide Note  09/12/2022 Name: Wanda Williams MRN: [id number] DOB: 03-30-1970  Referred by: Sandford Craze, NP Reason for referral : Care Coordination (Outreach to schedule referral with Pharm d )   Wanda Williams is a 52 y.o. year old female who is a primary care patient of Sandford Craze, NP. Wanda Williams was referred to the pharmacist for assistance related to HTN.    An unsuccessful telephone outreach was attempted today to contact the patient who was referred to the pharmacy team for assistance with medication assistance. Additional attempts will be made to contact the patient.   Penne Lash, RMA Care Guide [facility]  [place], [place] [address] Direct Dial: [phone] [email or web address]

## 2022-09-13 ENCOUNTER — Ambulatory Visit (HOSPITAL_BASED_OUTPATIENT_CLINIC_OR_DEPARTMENT_OTHER)
Admission: RE | Admit: 2022-09-13 | Discharge: 2022-09-13 | Disposition: A | Discharge status: Home / Self Care | Payer: Managed Care, Other (non HMO) | Source: Ambulatory Visit | Attending: Family | Admitting: Family

## 2022-09-13 ENCOUNTER — Encounter (HOSPITAL_BASED_OUTPATIENT_CLINIC_OR_DEPARTMENT_OTHER): Payer: Self-pay

## 2022-09-13 DIAGNOSIS — Z1231 Encounter for screening mammogram for malignant neoplasm of breast: Secondary | ICD-10-CM | POA: Diagnosis not present

## 2022-09-15 ENCOUNTER — Other Ambulatory Visit: Payer: Self-pay | Admitting: Family

## 2022-09-15 DIAGNOSIS — R928 Other abnormal and inconclusive findings on diagnostic imaging of breast: Secondary | ICD-10-CM

## 2022-09-15 NOTE — Progress Notes (Signed)
   Care Guide Note  09/15/2022 Name: Wanda Williams MRN: [id number] DOB: [birth year]  Referred by: Sandford Craze, NP Reason for referral : Care Coordination (Outreach to schedule referral with Pharm d )   Wanda Williams is a 52 y.o. year old female who is a primary care patient of Sandford Craze, NP. Wanda Williams was referred to the pharmacist for assistance related to HTN.    A second unsuccessful telephone outreach was attempted today to contact the patient who was referred to the pharmacy team for assistance with medication management. Additional attempts will be made to contact the patient.  Penne Lash, RMA Care Guide [facility]  [place], [place] [address] Direct Dial: [phone] [email or web address]

## 2022-09-20 NOTE — Progress Notes (Signed)
   Care Guide Note  09/20/2022 Name: Ewelina Naves MRN: [id number] DOB: 1971/01/12  Referred by: Sandford Craze, NP Reason for referral : Care Coordination (Outreach to schedule referral with Pharm d )   Latina Taima Rada is a 52 y.o. year old female who is a primary care patient of Sandford Craze, NP. Carine Ramonia Mcclaran was referred to the pharmacist for assistance related to HTN.    Successful contact was made with the patient to discuss pharmacy services including being ready for the pharmacist to call at least 5 minutes before the scheduled appointment time, to have medication bottles and any blood sugar or blood pressure readings ready for review. The patient agreed to meet with the pharmacist via with the pharmacist via telephone visit on (date/time).  09/28/2022  Penne Lash, RMA Care Guide [facility]  [place], [place] [address] Direct Dial: [phone] [email or web address]

## 2022-09-28 ENCOUNTER — Ambulatory Visit (INDEPENDENT_AMBULATORY_CARE_PROVIDER_SITE_OTHER): Payer: Managed Care, Other (non HMO) | Admitting: Pharmacist

## 2022-09-28 DIAGNOSIS — I10 Essential (primary) hypertension: Secondary | ICD-10-CM

## 2022-09-28 MED ORDER — AMLODIPINE BESYLATE 2.5 MG PO TABS: 2.5000 mg | ORAL_TABLET | Freq: Every day | ORAL | 0 refills | Status: DC

## 2022-09-28 NOTE — Progress Notes (Signed)
09/28/2022 Name: Wanda Williams MRN: [id number] DOB: [birth year]  Chief Complaint  Patient presents with   Medication Management   Hypertension    Wanda Williams is a 52 y.o. year old female who presented for a telephone visit.   They were referred to the pharmacist by their PCP for assistance in managing hypertension and medication access.    Subjective:  Medication Access/Adherence  Patient reports affordability concerns with their medications: Yes  Patient reports access/transportation concerns to their pharmacy: No  Patient reports adherence concerns with their medications:  Yes     Patient was referred to Clinical Pharmacist Practitioner to discuss hypertension therapy and medications costs. Per patient her copays for current medication are too expensive.  Called [facility] - only recent Rx filled there was bupropion XL 150mg  - cost was $11 for 30 day supply.   Called [facility] for other med costs - Patient will 63 DS of the following - amlodipine 2.5mg  was $4.62; vanlafaxine was $33 and valsartan+hydrochlorothiazide 160.25 was $33  It appears she has 2 insurance plans - [employer] with [facility] which has [facility] as Art gallery manager HMO with [place] [employer] (Silver 4 CMS Standard formulary) which has Publishing rights manager as Mudlogger.   Using mail order for both plans is usually the less expensive option but since each plan has different PBM and preferred mail order pharmacy, she would not be able to have meds processed with both plans. This might be an option if she uses a Insurance claims handler.     Hypertension:  Current medications: amlodipine 2.5mg  daily and valsartan hydrochlorothiazide 160/25mg  daily   BP Readings from Last 3 Encounters:  09/06/22 (!) 149/88  05/18/22 139/72  05/09/22 (!) 134/92      Objective:  Lab Results  Component Value Date   HGBA1C 6.1 (H) 04/06/2022    Lab Results  Component Value Date   CREATININE  0.53 (L) 04/06/2022   BUN 16 04/06/2022   NA 142 04/06/2022   K 3.9 04/06/2022   CL 102 04/06/2022   CO2 25 04/06/2022    Lab Results  Component Value Date   CHOL 164 04/06/2022   HDL 41 04/06/2022   LDLCALC 97 04/06/2022   LDLDIRECT 107.0 05/12/2021   TRIG 145 04/06/2022   CHOLHDL 4.0 04/06/2022    Medications Reviewed Today     Reviewed by Henrene Pastor, RPH-CPP (Pharmacist) on 09/28/22 at 1051  Med List Status: <None>   Medication Order Taking? Sig Documenting Provider Last Dose Status Informant  amLODipine (NORVASC) 2.5 MG tablet [id number] No TAKE 1 TABLET BY MOUTH DAILY Sandford Craze, NP Taking Active   buPROPion (WELLBUTRIN XL) 150 MG 24 hr tablet [id number]  Take 1 tablet (150 mg total) by mouth daily. Sandford Craze, NP  Active   gabapentin (NEURONTIN) 300 MG capsule [id number] No Take 1 capsule (300 mg total) by mouth 2 (two) times daily. Madelyn Brunner, DO Taking Active   valsartan-hydrochlorothiazide (DIOVAN-HCT) 160-25 MG tablet [id number] No TAKE 1 TABLET BY MOUTH DAILY Sandford Craze, NP Taking Active   venlafaxine XR (EFFEXOR-XR) 150 MG 24 hr capsule [id number] No TAKE 1 CAPSULE BY MOUTH DAILY  WITH BREAKFAST Sandford Craze, NP Taking Active               Assessment/Plan:   Medication Adherence / Hypertension: Currently not at blood pressure goal of < 140/90 due to medication cost / low adherence - Reviewed long term cardiovascular and renal outcomes of  uncontrolled blood pressure - Reviewed both her insurance plans and medication costs. Patient's [place] [employer] plan is the lowest cost for medications but per her usual [facility] pharmacy they are not contracted with this plan.  - Sent Rx for amlodipine to [facility] and they are able to use [employer] and cost of amlodipine was $0.39. (according to her insurance plan other med cost - valsartan hydrochlorothiazide $6.67 for 30 days; bupropion XL 150mg  $4.14 for 30 days and  venlafaxine 150mg  $5.10 for 30 days  - total estimated monthly drug cost of $16.30) - Left patient a message about drug cost on her VM. She can have transferred from [facility] if she chooses or other pharmacy that is in network with her plan. (Can call number on back of card to see which pharmacies are in network)  - Recommend to restart amlodipine and valsartan hydrochlorothiazide for blood pressure - check blood pressure at home 2 to 3 times per weekly - call office if blood pressure contiues to be > 140/90   Follow Up Plan: 3 to 4 weeks  Henrene Pastor, PharmD Clinical Pharmacist [facility]

## 2022-09-30 ENCOUNTER — Other Ambulatory Visit (HOSPITAL_BASED_OUTPATIENT_CLINIC_OR_DEPARTMENT_OTHER): Payer: Self-pay

## 2022-09-30 MED ORDER — AMLODIPINE BESYLATE 2.5 MG PO TABS
2.5000 mg | ORAL_TABLET | Freq: Every day | ORAL | 0 refills | Status: DC
  Filled 2022-09-30: qty 30, 30d supply, fill #0
  Filled 2022-12-28: qty 30, 30d supply, fill #1
  Filled 2023-02-03 – 2023-02-16 (×2): qty 30, 30d supply, fill #2

## 2022-10-04 ENCOUNTER — Ambulatory Visit
Admission: RE | Admit: 2022-10-04 | Discharge: 2022-10-04 | Disposition: A | Discharge status: Home / Self Care | Payer: Managed Care, Other (non HMO) | Source: Ambulatory Visit | Attending: Family | Admitting: Family

## 2022-10-04 ENCOUNTER — Other Ambulatory Visit: Payer: Self-pay | Admitting: Family

## 2022-10-04 DIAGNOSIS — R928 Other abnormal and inconclusive findings on diagnostic imaging of breast: Secondary | ICD-10-CM

## 2022-10-04 DIAGNOSIS — N6489 Other specified disorders of breast: Secondary | ICD-10-CM

## 2022-10-19 ENCOUNTER — Other Ambulatory Visit: Payer: Self-pay

## 2022-10-19 MED ORDER — BUPROPION HCL ER (XL) 150 MG PO TB24: 150.0000 mg | ORAL_TABLET | Freq: Every day | ORAL | 0 refills | Status: DC

## 2022-10-24 ENCOUNTER — Telehealth: Payer: Self-pay | Admitting: Pharmacist

## 2022-10-24 ENCOUNTER — Telehealth: Payer: Managed Care, Other (non HMO)

## 2022-10-24 NOTE — Telephone Encounter (Signed)
Clinical Pharmacist Practitioner follow up outreach unsuccessful.  Called to check in with patient regarding medication costs.  LM on VM with CB# [phone] or [phone]

## 2022-11-13 IMAGING — MG MM DIGITAL SCREENING BILAT W/ TOMO AND CAD
8 series · 8 of 24 positions shown · non-contrast
Comparison: Previous exam(s).

CLINICAL DATA: Screening.

EXAM:
DIGITAL SCREENING BILATERAL MAMMOGRAM WITH TOMOSYNTHESIS AND CAD
TECHNIQUE: Bilateral screening digital craniocaudal and mediolateral oblique
mammograms were obtained. Bilateral screening digital breast
tomosynthesis was performed. The images were evaluated with
computer-aided detection.

[L MLO synth-2D]
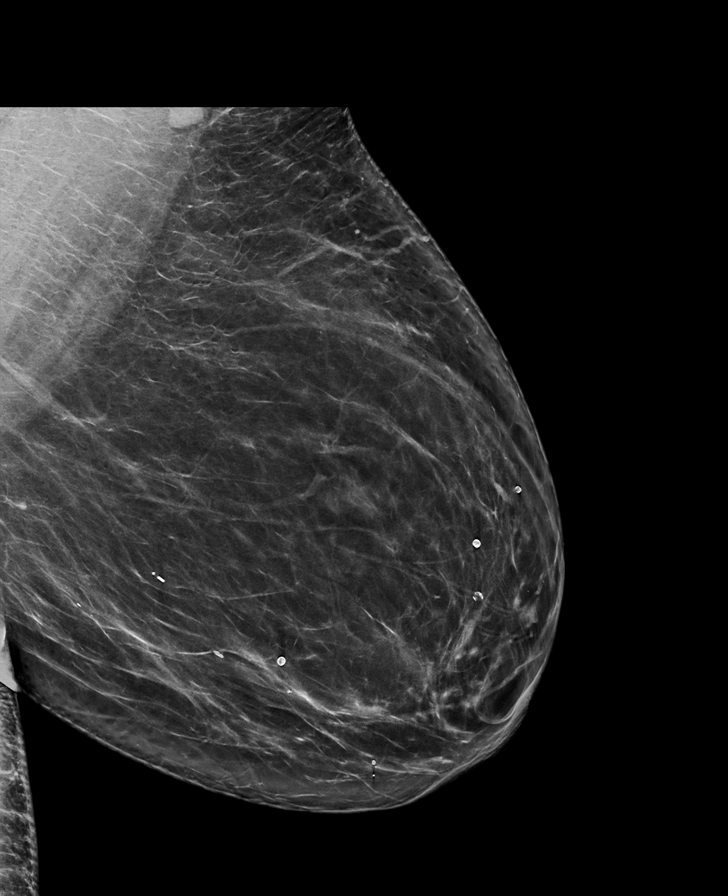

[L CC synth-2D]
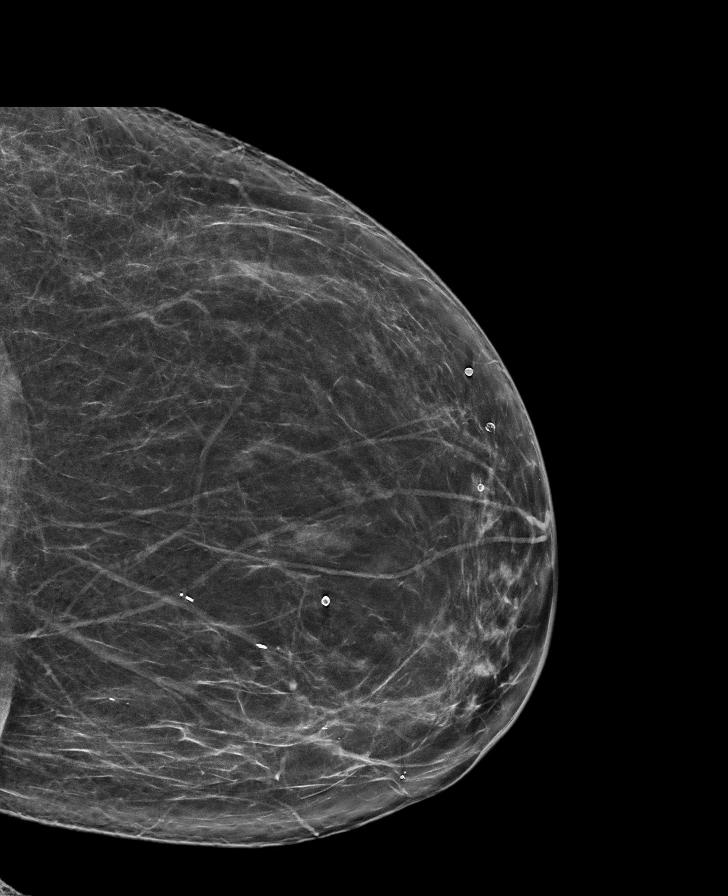

[R CC synth-2D]
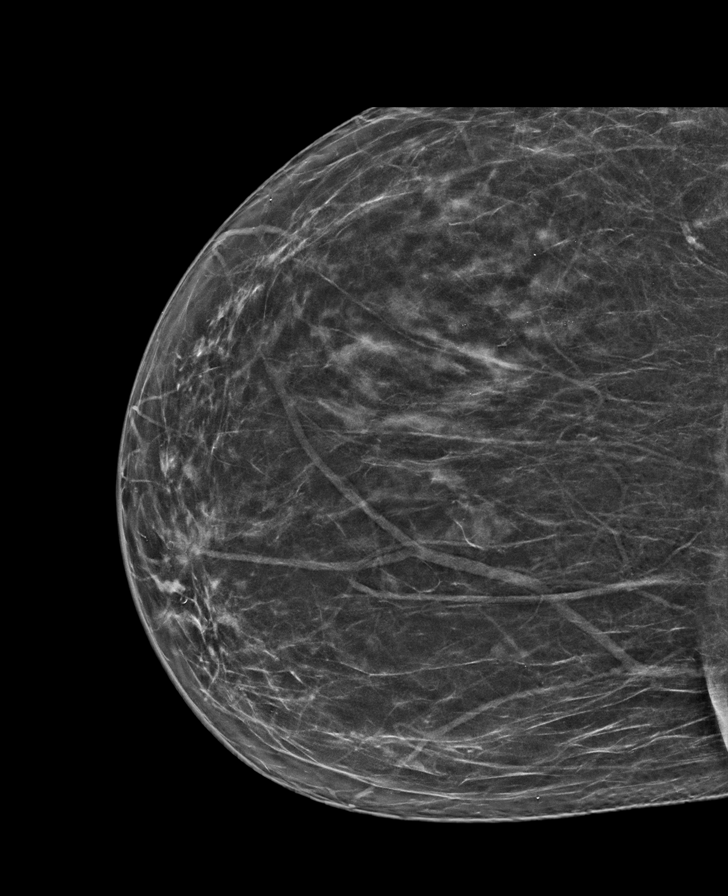

[R MLO synth-2D]
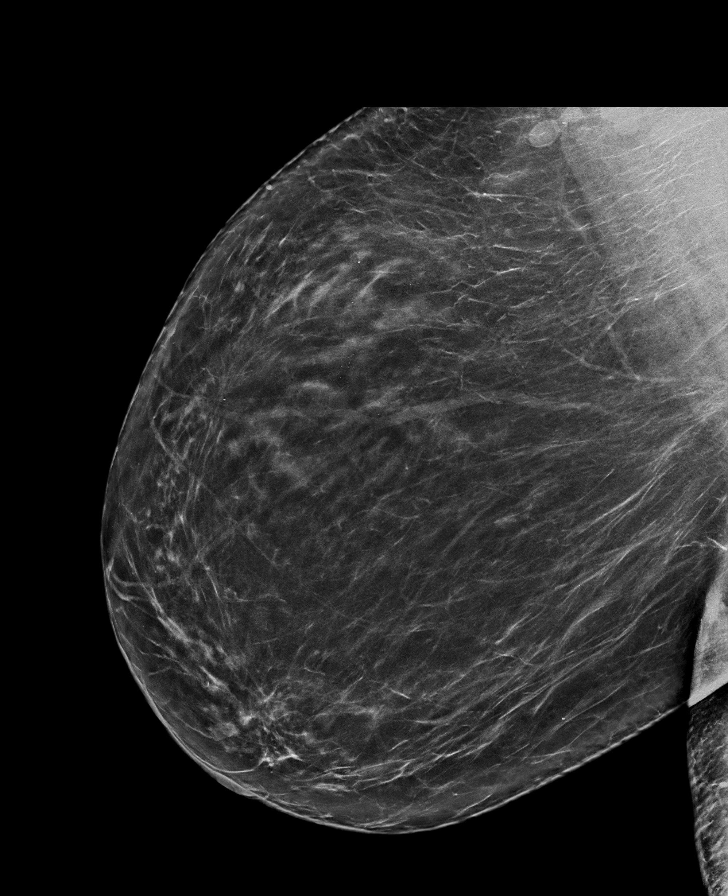

[L MLO tomo · tomo slice 45/88.0]
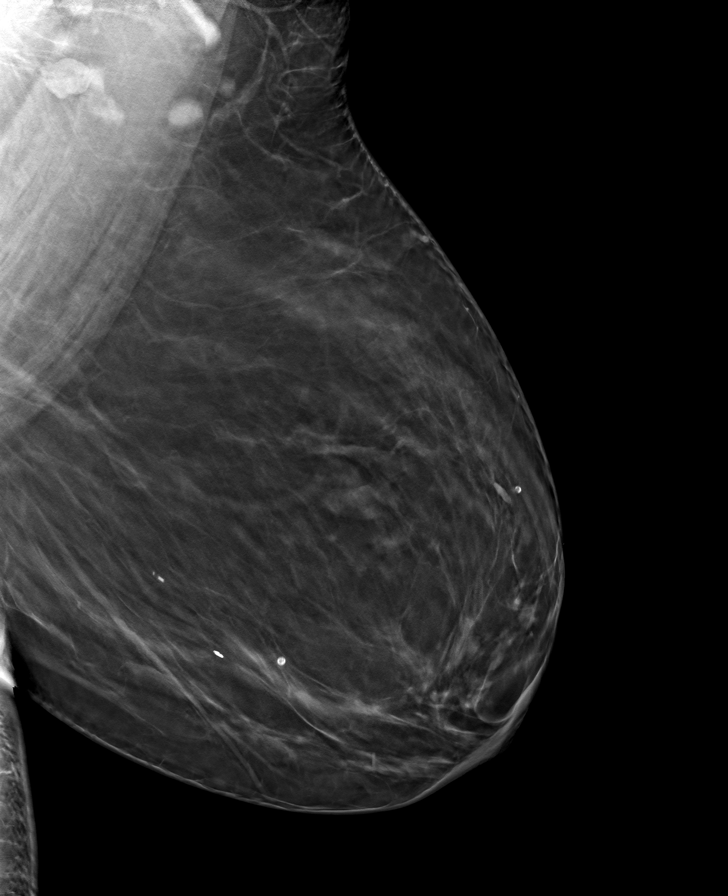

[R CC tomo · tomo slice 37/72.0]
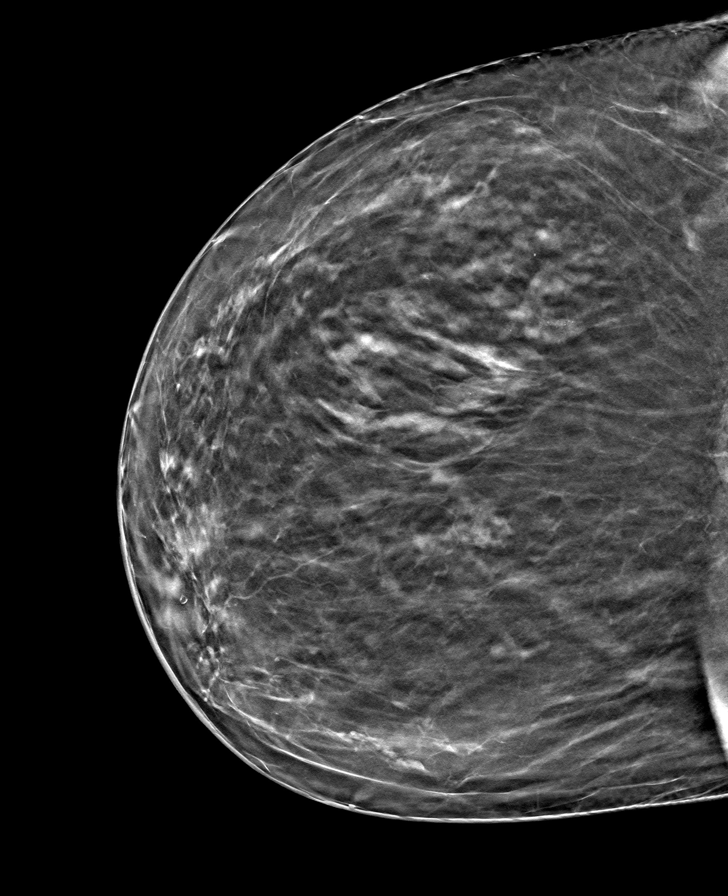

[L CC tomo · tomo slice 39/76.0]
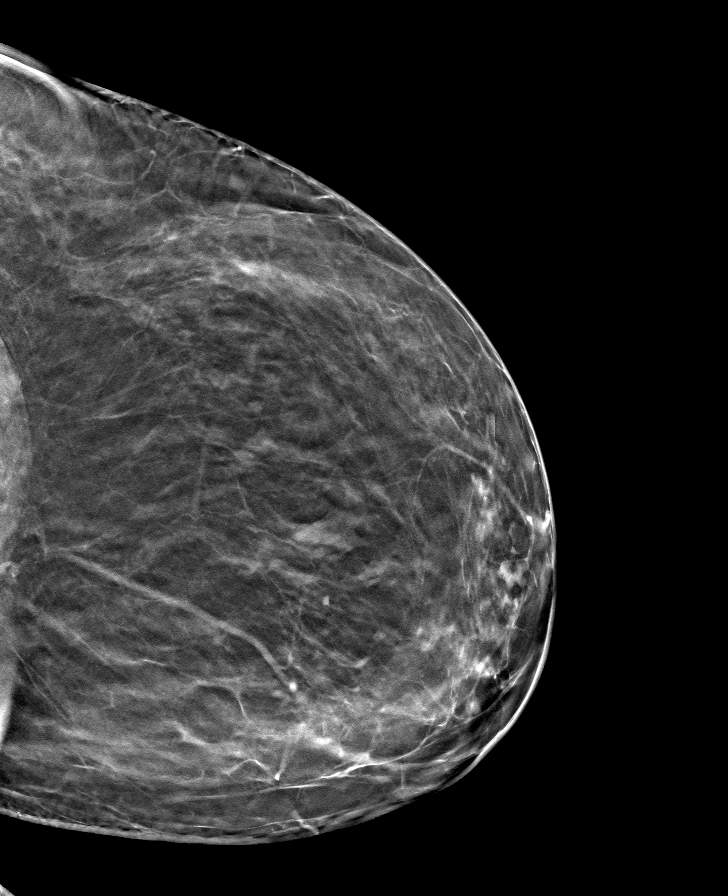

[R MLO tomo · tomo slice 45/89.0]
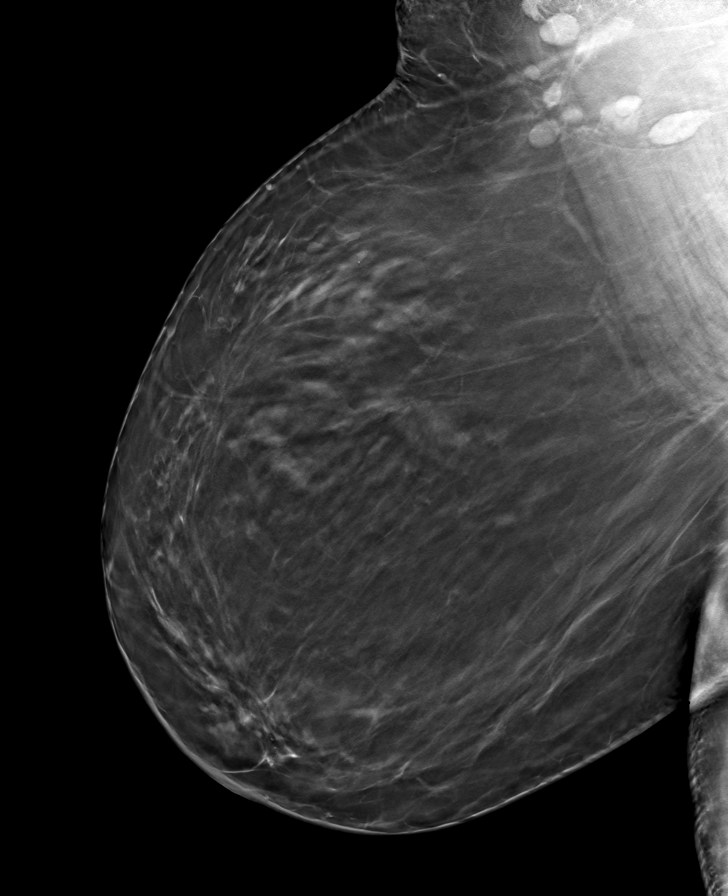

[8 of 24 positions shown; findings below may reference images not displayed]

ACR Breast Density Category b: There are scattered areas of
fibroglandular density.
FINDINGS: There are no findings suspicious for malignancy.
IMPRESSION: No mammographic evidence of malignancy. A result letter of this
screening mammogram will be mailed directly to the patient.

RECOMMENDATION:
Screening mammogram in one year. (Code:51-O-LD2)

BI-RADS CATEGORY  1: Negative.

## 2022-11-23 ENCOUNTER — Telehealth: Payer: Self-pay | Admitting: Pharmacist

## 2022-11-23 NOTE — Telephone Encounter (Signed)
Patient has called to ask about cost of medications at [facility] pharmacy. Provided the following information on her VM.  [facility] is able to bill meds to her [place] [employer] Plan [facility] is not)  Cost of amlodipine $0.39, valsartan hydrochlorothiazide $6.67 for 30 days; bupropion XL 150mg  $4.14 for 30 days and venlafaxine 150mg  $5.10 for 30 days - total estimated monthly drug cost of $16.30.  LM for patient to call me if she would like to have prescriptions sent to [facility] pharmacy.

## 2022-11-25 ENCOUNTER — Other Ambulatory Visit (HOSPITAL_BASED_OUTPATIENT_CLINIC_OR_DEPARTMENT_OTHER): Payer: Self-pay

## 2022-11-25 MED ORDER — VALSARTAN-HYDROCHLOROTHIAZIDE 160-25 MG PO TABS
1.0000 | ORAL_TABLET | Freq: Every day | ORAL | 1 refills | Status: DC
  Filled 2022-11-25: qty 30, 30d supply, fill #0
  Filled 2022-12-28: qty 30, 30d supply, fill #1

## 2022-11-25 MED ORDER — VENLAFAXINE HCL ER 150 MG PO CP24
150.0000 mg | ORAL_CAPSULE | Freq: Every morning | ORAL | 1 refills | Status: DC
  Filled 2022-11-25: qty 30, 30d supply, fill #0
  Filled 2022-12-28: qty 30, 30d supply, fill #1

## 2022-11-25 NOTE — Telephone Encounter (Signed)
Patient called back and asked if venlafaxine and valsartan hydrochlorothiazide could be sent to [facility] pharmacy since cost is lower.

## 2022-11-25 NOTE — Addendum Note (Signed)
Addended by: Henrene Pastor B on: 11/25/2022 04:41 PM   Modules accepted: Orders

## 2022-11-30 ENCOUNTER — Other Ambulatory Visit (HOSPITAL_BASED_OUTPATIENT_CLINIC_OR_DEPARTMENT_OTHER): Payer: Self-pay

## 2022-12-28 ENCOUNTER — Other Ambulatory Visit (HOSPITAL_BASED_OUTPATIENT_CLINIC_OR_DEPARTMENT_OTHER): Payer: Self-pay

## 2023-01-19 ENCOUNTER — Other Ambulatory Visit: Payer: Self-pay | Admitting: Family

## 2023-02-03 ENCOUNTER — Other Ambulatory Visit: Payer: Self-pay | Admitting: Family

## 2023-02-03 ENCOUNTER — Other Ambulatory Visit: Payer: Self-pay

## 2023-02-04 ENCOUNTER — Other Ambulatory Visit: Payer: Self-pay

## 2023-02-04 ENCOUNTER — Encounter: Payer: Self-pay | Admitting: *Deleted

## 2023-02-04 MED ORDER — VENLAFAXINE HCL ER 150 MG PO CP24
150.0000 mg | ORAL_CAPSULE | Freq: Every morning | ORAL | 0 refills | Status: DC
  Filled 2023-02-04 – 2023-02-16 (×2): qty 30, 30d supply, fill #0

## 2023-02-04 MED ORDER — VALSARTAN-HYDROCHLOROTHIAZIDE 160-25 MG PO TABS
1.0000 | ORAL_TABLET | Freq: Every day | ORAL | 0 refills | Status: DC
  Filled 2023-02-04 – 2023-02-16 (×2): qty 30, 30d supply, fill #0

## 2023-02-05 ENCOUNTER — Other Ambulatory Visit (HOSPITAL_BASED_OUTPATIENT_CLINIC_OR_DEPARTMENT_OTHER): Payer: Self-pay

## 2023-02-13 ENCOUNTER — Other Ambulatory Visit (HOSPITAL_BASED_OUTPATIENT_CLINIC_OR_DEPARTMENT_OTHER): Payer: Self-pay

## 2023-02-16 ENCOUNTER — Other Ambulatory Visit (HOSPITAL_BASED_OUTPATIENT_CLINIC_OR_DEPARTMENT_OTHER): Payer: Self-pay

## 2023-02-20 ENCOUNTER — Encounter: Payer: Self-pay | Admitting: *Deleted

## 2023-03-27 ENCOUNTER — Other Ambulatory Visit: Payer: Self-pay | Admitting: Family

## 2023-03-27 ENCOUNTER — Other Ambulatory Visit: Payer: Self-pay | Admitting: Internal Medicine

## 2023-03-27 ENCOUNTER — Encounter (HOSPITAL_BASED_OUTPATIENT_CLINIC_OR_DEPARTMENT_OTHER): Payer: Self-pay

## 2023-03-27 ENCOUNTER — Other Ambulatory Visit (HOSPITAL_BASED_OUTPATIENT_CLINIC_OR_DEPARTMENT_OTHER): Payer: Self-pay

## 2023-03-27 MED ORDER — AMLODIPINE BESYLATE 2.5 MG PO TABS
2.5000 mg | ORAL_TABLET | Freq: Every day | ORAL | 0 refills | Status: DC
  Filled 2023-03-27: qty 30, 30d supply, fill #0

## 2023-03-28 ENCOUNTER — Ambulatory Visit: Payer: Managed Care, Other (non HMO) | Admitting: Family

## 2023-04-03 ENCOUNTER — Other Ambulatory Visit: Payer: Self-pay | Admitting: Family

## 2023-04-04 ENCOUNTER — Ambulatory Visit: Payer: Managed Care, Other (non HMO) | Admitting: Family

## 2023-04-04 ENCOUNTER — Telehealth: Payer: Self-pay | Admitting: *Deleted

## 2023-04-04 VITALS — BP 167/90 | HR 77 | Temp 98.7°F | Resp 16 | Ht <= 58 in | Wt 205.0 lb

## 2023-04-04 DIAGNOSIS — F418 Other specified anxiety disorders: Secondary | ICD-10-CM | POA: Diagnosis not present

## 2023-04-04 DIAGNOSIS — I10 Essential (primary) hypertension: Secondary | ICD-10-CM

## 2023-04-04 DIAGNOSIS — R0789 Other chest pain: Secondary | ICD-10-CM | POA: Diagnosis not present

## 2023-04-04 DIAGNOSIS — R8290 Unspecified abnormal findings in urine: Secondary | ICD-10-CM

## 2023-04-04 LAB — POC URINALSYSI DIPSTICK (AUTOMATED)
Bilirubin, UA: NEGATIVE
Glucose, UA: NEGATIVE
Ketones, UA: NEGATIVE
Nitrite, UA: NEGATIVE
Protein, UA: NEGATIVE
Spec Grav, UA: 1.025 (ref 1.010–1.025)
Urobilinogen, UA: 0.2 U/dL
pH, UA: 6 (ref 5.0–8.0)

## 2023-04-04 MED ORDER — VENLAFAXINE HCL ER 150 MG PO CP24: 150.0000 mg | ORAL_CAPSULE | Freq: Every day | ORAL | 1 refills | Status: DC

## 2023-04-04 MED ORDER — AMLODIPINE BESYLATE 2.5 MG PO TABS: 2.5000 mg | ORAL_TABLET | Freq: Every day | ORAL | 1 refills | Status: DC

## 2023-04-04 MED ORDER — VALSARTAN-HYDROCHLOROTHIAZIDE 160-25 MG PO TABS: 1.0000 | ORAL_TABLET | Freq: Every day | ORAL | 1 refills | Status: DC

## 2023-04-04 MED ORDER — VALSARTAN-HYDROCHLOROTHIAZIDE 160-25 MG PO TABS
1.0000 | ORAL_TABLET | Freq: Every day | ORAL | 0 refills | Status: DC
  Filled 2023-04-04: qty 5, 5d supply, fill #0

## 2023-04-04 MED ORDER — VENLAFAXINE HCL ER 150 MG PO CP24
150.0000 mg | ORAL_CAPSULE | Freq: Every day | ORAL | 0 refills | Status: DC
  Filled 2023-04-04: qty 5, 5d supply, fill #0

## 2023-04-04 MED ORDER — AMLODIPINE BESYLATE 2.5 MG PO TABS: 2.5000 mg | ORAL_TABLET | Freq: Every day | ORAL | 0 refills | Status: DC

## 2023-04-04 NOTE — Patient Instructions (Signed)
VISIT SUMMARY:  Today, we discussed your recent symptoms and medication management. You mentioned experiencing chest heaviness and worsening mood after being out of your medications for three weeks. We reviewed your EKG, which showed normal results, and discussed your hypertension and depression management.  YOUR PLAN:  -CHEST PAIN: Chest pain can be a sign of heart problems.  We recommended that you go down to the ER tonight for further evaluation, however you declined. Your EKG was normal, but we are referring you to a cardiologist for further evaluation. Please call 911 if your symptoms worsen.  -HYPERTENSION: Hypertension, or high blood pressure, can lead to serious health issues if not managed. We are restarting your antihypertensive medication and scheduling a nurse visit to recheck your blood pressure on Friday, April 07, 2023.  -DEPRESSION: Depression is a mood disorder that affects your feelings and daily activities. Your mood has worsened since stopping Effexor, so we are restarting this medication to help manage your symptoms.  INSTRUCTIONS:  Please follow up with the cardiologist as referred and attend the nurse visit for your blood pressure recheck on Friday, April 07, 2023. Restart your antihypertensive medication and Effexor as prescribed. Call 911 if your chest pain worsens.

## 2023-04-04 NOTE — Telephone Encounter (Signed)
Left message on machine to see if patient would like to come in a little sooner than 620pm

## 2023-04-04 NOTE — Progress Notes (Signed)
Subjective:     Patient ID: Wanda Williams, female    DOB: [birth year], 53 y.o.   MRN: [id number]  Chief Complaint  Patient presents with   Malodorous urine    Patient complains of urine odor   Depression    Here for follow up   Hypertension    Here for follow up    HPI  Discussed the use of AI scribe software for clinical note transcription with the patient, who gave verbal consent to proceed.  History of Present Illness   The patient is a 53 year old female with hypertension and depression who presents for follow-up after being out of medications for three weeks.  She has experienced worsening mood since discontinuing Effexor three weeks ago. She continues to take Wellbutrin. Her mood has been notably affected by the absence of Effexor, contributing to increased depressive symptoms.  She reports experiencing chest heaviness throughout the day today. No associated shortness of breath is noted.   She also notes malodorous urine.  She has been out of her medications for three weeks, which includes her antihypertensive medication.          Health Maintenance Due  Topic Date Due   COVID-19 Vaccine (3 - 2024-25 season) 11/06/2022    Past Medical History:  Diagnosis Date   Allergy    Angio-edema    Anxiety    Depression    Eczema    HTN (hypertension)    Sleep apnea    uses CPAP nightly    Past Surgical History:  Procedure Laterality Date   BREAST LUMPECTOMY WITH RADIOACTIVE SEED LOCALIZATION Left 11/27/2018   Procedure: RADIOACTIVE SEED GUIDED LEFT BREAST LUMPECTOMY;  Surgeon: Harriette Bouillon, MD;  Location: [facility];  Service: General;  Laterality: Left;   CARPAL TUNNEL RELEASE Right 05/09/2022   Procedure: RIGHT CARPAL TUNNEL RELEASE;  Surgeon: Betha Loa, MD;  Location: [facility];  Service: Orthopedics;  Laterality: Right;  30  MIN   CESAREAN SECTION     x 2   DILATION AND CURETTAGE OF UTERUS     THYROID CYST EXCISION      TUBAL LIGATION  2006    Family History  Problem Relation Age of Onset   Heart disease Father        heart murmur   Parkinson's disease Father    Hypertension Mother    Coronary artery disease Paternal Uncle        2 uncles (both died before 32)   Coronary artery disease Paternal Aunt        MI, died before 57   Melanoma Brother 23       melanoma   Allergic rhinitis Sister    Colon cancer Neg Hx    Rectal cancer Neg Hx    Stomach cancer Neg Hx    Esophageal cancer Neg Hx     Social History   Socioeconomic History   Marital status: Married    Spouse name: Not on file   Number of children: Not on file   Years of education: Not on file   Highest education level: Not on file  Occupational History   Not on file  Tobacco Use   Smoking status: Never   Smokeless tobacco: Never  Vaping Use   Vaping status: Never Used  Substance and Sexual Activity   Alcohol use: Yes    Alcohol/week: 0.0 standard drinks of alcohol    Comment: occasional wine   Drug use: No  Sexual activity: Yes    Birth control/protection: Surgical, Post-menopausal    Comment: BTL  Other Topics Concern   Not on file  Social History Narrative   Works at [employer]   One daughter age 46   One daughter is age 37   Enjoys cross stitching, playing games on her cell phone.     2 dogs   Completed 12th grade.    Social Drivers of Corporate investment banker Strain: Not on file  Food Insecurity: Not on file  Transportation Needs: Not on file  Physical Activity: Not on file  Stress: Not on file  Social Connections: Not on file  Intimate Partner Violence: Not on file    Outpatient Medications Prior to Visit  Medication Sig Dispense Refill   buPROPion (WELLBUTRIN XL) 150 MG 24 hr tablet TAKE 1 TABLET BY MOUTH DAILY 90 tablet 0   amLODipine (NORVASC) 2.5 MG tablet Take 1 tablet (2.5 mg total) by mouth daily. 30 tablet 0   gabapentin (NEURONTIN) 300 MG capsule Take 1 capsule (300 mg total) by mouth  2 (two) times daily. 60 capsule 1   valsartan-hydrochlorothiazide (DIOVAN-HCT) 160-25 MG tablet Take 1 tablet by mouth daily. 30 tablet 0   venlafaxine XR (EFFEXOR-XR) 150 MG 24 hr capsule TAKE 1 CAPSULE BY MOUTH DAILY  WITH BREAKFAST 90 capsule 0   No facility-administered medications prior to visit.    Allergies  Allergen Reactions   Ace Inhibitors Cough    ROS    See HPI Objective:    Physical Exam Constitutional:      General: She is not in acute distress.    Appearance: Normal appearance. She is well-developed.  HENT:     Head: Normocephalic and atraumatic.     Right Ear: External ear normal.     Left Ear: External ear normal.  Eyes:     General: No scleral icterus. Neck:     Thyroid: No thyromegaly.  Cardiovascular:     Rate and Rhythm: Normal rate and regular rhythm.     Heart sounds: Normal heart sounds. No murmur heard. Pulmonary:     Effort: Pulmonary effort is normal. No respiratory distress.     Breath sounds: Normal breath sounds. No wheezing.  Musculoskeletal:     Cervical back: Neck supple.  Skin:    General: Skin is warm and dry.  Neurological:     Mental Status: She is alert and oriented to person, place, and time.  Psychiatric:        Mood and Affect: Mood normal.        Behavior: Behavior normal.        Thought Content: Thought content normal.        Judgment: Judgment normal.      BP (!) 167/90 (BP Location: Right Arm, Patient Position: Sitting, Cuff Size: Large)   Pulse 77   Temp 98.7 F (37.1 C) (Oral)   Resp 16   Ht 4\' 10"  (1.473 m)   Wt 205 lb (93 kg)   LMP 04/20/2016 (LMP Unknown) Comment: BTL  SpO2 100%   BMI 42.85 kg/m  Wt Readings from Last 3 Encounters:  04/04/23 205 lb (93 kg)  09/06/22 203 lb (92.1 kg)  05/18/22 200 lb (90.7 kg)       Assessment & Plan:   Problem List Items Addressed This Visit       Unprioritized   Essential hypertension - Primary    Patient has been off medication for three weeks. -  Restart  antihypertensive medication. -Schedule nurse visit for blood pressure recheck on Friday, April 07, 2023.      Relevant Medications   valsartan-hydrochlorothiazide (DIOVAN-HCT) 160-25 MG tablet   amLODipine (NORVASC) 2.5 MG tablet   Other Relevant Orders   Comp Met (CMET)   HgB A1c   Lipid panel   Depression with anxiety    Worsening mood and increased anxiety after discontinuation of Effexor. Continues on Wellbutrin. -Restart Effexor.      Relevant Medications   venlafaxine XR (EFFEXOR-XR) 150 MG 24 hr capsule   Atypical chest pain    New onset chest heaviness without shortness of breath. EKG shows normal sinus rhythm without acute changes. Patient declined recommendation to proceed to the ER for evaluation but agreed to call 911 if symptoms worsen. -Refer to cardiology for outpatient evaluation.      Relevant Orders   EKG 12-Lead (Completed)   Ambulatory referral to Cardiology   Other Visit Diagnoses       Malodorous urine       Relevant Orders   POCT Urinalysis Dipstick (Automated) (Completed)   Urine Culture       I have discontinued Zailee J. Desrosiers's gabapentin. I am also having her maintain her buPROPion, venlafaxine XR, valsartan-hydrochlorothiazide, and amLODipine.  Meds ordered this encounter  Medications   DISCONTD: valsartan-hydrochlorothiazide (DIOVAN-HCT) 160-25 MG tablet    Sig: Take 1 tablet by mouth daily.    Dispense:  5 tablet    Refill:  0    Patient will pay cash    Supervising Provider:   Danise Edge A [[id number]]   DISCONTD: amLODipine (NORVASC) 2.5 MG tablet    Sig: Take 1 tablet (2.5 mg total) by mouth daily.    Dispense:  5 tablet    Refill:  0    Patient will pay cash    Supervising Provider:   Danise Edge A [[id number]]   DISCONTD: venlafaxine XR (EFFEXOR-XR) 150 MG 24 hr capsule    Sig: Take 1 capsule (150 mg total) by mouth daily with breakfast.    Dispense:  5 capsule    Refill:  0    Please send a replace/new response with 90-Day  Supply if appropriate to maximize member benefit. Requesting 1 year supply.    Supervising Provider:   Danise Edge A [[id number]]   venlafaxine XR (EFFEXOR-XR) 150 MG 24 hr capsule    Sig: Take 1 capsule (150 mg total) by mouth daily with breakfast.    Dispense:  90 capsule    Refill:  1    Supervising Provider:   Danise Edge A [[id number]]   valsartan-hydrochlorothiazide (DIOVAN-HCT) 160-25 MG tablet    Sig: Take 1 tablet by mouth daily.    Dispense:  90 tablet    Refill:  1    Supervising Provider:   Danise Edge A [[id number]]   amLODipine (NORVASC) 2.5 MG tablet    Sig: Take 1 tablet (2.5 mg total) by mouth daily.    Dispense:  90 tablet    Refill:  1    Supervising Provider:   Danise Edge A [[id number]]

## 2023-04-04 NOTE — Assessment & Plan Note (Signed)
  Worsening mood and increased anxiety after discontinuation of Effexor. Continues on Wellbutrin. -Restart Effexor.

## 2023-04-04 NOTE — Assessment & Plan Note (Signed)
  New onset chest heaviness without shortness of breath. EKG shows normal sinus rhythm without acute changes. Patient declined recommendation to proceed to the ER for evaluation but agreed to call 911 if symptoms worsen. -Refer to cardiology for outpatient evaluation.

## 2023-04-04 NOTE — Assessment & Plan Note (Signed)
  Patient has been off medication for three weeks. -Restart antihypertensive medication. -Schedule nurse visit for blood pressure recheck on Friday, April 07, 2023.

## 2023-04-05 ENCOUNTER — Other Ambulatory Visit: Payer: Self-pay

## 2023-04-05 ENCOUNTER — Other Ambulatory Visit (HOSPITAL_BASED_OUTPATIENT_CLINIC_OR_DEPARTMENT_OTHER): Payer: Self-pay

## 2023-04-05 MED ORDER — AMLODIPINE BESYLATE 2.5 MG PO TABS
2.5000 mg | ORAL_TABLET | Freq: Every day | ORAL | 0 refills | Status: DC
  Filled 2023-04-05: qty 7, 7d supply, fill #0

## 2023-04-05 MED ORDER — VALSARTAN-HYDROCHLOROTHIAZIDE 160-25 MG PO TABS
1.0000 | ORAL_TABLET | Freq: Every day | ORAL | 0 refills | Status: DC
  Filled 2023-04-05: qty 7, 7d supply, fill #0

## 2023-04-05 MED ORDER — VENLAFAXINE HCL ER 150 MG PO CP24
150.0000 mg | ORAL_CAPSULE | Freq: Every day | ORAL | 0 refills | Status: DC
  Filled 2023-04-05: qty 7, 7d supply, fill #0

## 2023-04-05 NOTE — Addendum Note (Signed)
Addended by: Wilford Corner on: 04/05/2023 11:30 AM   Modules accepted: Orders

## 2023-04-06 ENCOUNTER — Telehealth: Payer: Self-pay | Admitting: Family

## 2023-04-06 LAB — URINE CULTURE
MICRO NUMBER:: 16008054
SPECIMEN QUALITY:: ADEQUATE

## 2023-04-06 MED ORDER — NITROFURANTOIN MONOHYD MACRO 100 MG PO CAPS: 100.0000 mg | ORAL_CAPSULE | Freq: Two times a day (BID) | ORAL | 0 refills | Status: AC

## 2023-04-06 NOTE — Telephone Encounter (Signed)
See mychart.  Please call pt if she has not read the message yet.

## 2023-04-07 ENCOUNTER — Ambulatory Visit: Payer: Managed Care, Other (non HMO) | Admitting: Family

## 2023-04-07 ENCOUNTER — Encounter: Payer: Self-pay | Admitting: Family

## 2023-04-07 ENCOUNTER — Ambulatory Visit: Payer: Managed Care, Other (non HMO)

## 2023-04-07 ENCOUNTER — Other Ambulatory Visit: Payer: Self-pay | Admitting: Emergency Medicine

## 2023-04-07 ENCOUNTER — Other Ambulatory Visit (INDEPENDENT_AMBULATORY_CARE_PROVIDER_SITE_OTHER): Payer: Managed Care, Other (non HMO)

## 2023-04-07 ENCOUNTER — Ambulatory Visit
Admission: RE | Admit: 2023-04-07 | Discharge: 2023-04-07 | Disposition: A | Discharge status: Home / Self Care | Payer: Managed Care, Other (non HMO) | Source: Ambulatory Visit | Attending: Family | Admitting: Family

## 2023-04-07 ENCOUNTER — Other Ambulatory Visit (HOSPITAL_BASED_OUTPATIENT_CLINIC_OR_DEPARTMENT_OTHER): Payer: Self-pay

## 2023-04-07 VITALS — BP 150/96 | HR 68

## 2023-04-07 DIAGNOSIS — I10 Essential (primary) hypertension: Secondary | ICD-10-CM

## 2023-04-07 DIAGNOSIS — N6489 Other specified disorders of breast: Secondary | ICD-10-CM

## 2023-04-07 DIAGNOSIS — R0789 Other chest pain: Secondary | ICD-10-CM

## 2023-04-07 LAB — TROPONIN I (HIGH SENSITIVITY): High Sens Troponin I: 4 ng/L (ref 2–17)

## 2023-04-07 LAB — LIPID PANEL
Cholesterol: 161 mg/dL (ref 0–200)
HDL: 55.2 mg/dL (ref >39.00)
LDL Cholesterol: 94 mg/dL (ref 0–99)
NonHDL: 105.87
Total CHOL/HDL Ratio: 3
Triglycerides: 57 mg/dL (ref 0.0–149.0)
VLDL: 11.4 mg/dL (ref 0.0–40.0)

## 2023-04-07 LAB — COMPREHENSIVE METABOLIC PANEL
ALT: 38 U/L — ABNORMAL HIGH (ref 0–35)
AST: 25 U/L (ref 0–37)
Albumin: 4.3 g/dL (ref 3.5–5.2)
Alkaline Phosphatase: 91 U/L (ref 39–117)
BUN: 13 mg/dL (ref 6–23)
CO2: 30 meq/L (ref 19–32)
Calcium: 9.1 mg/dL (ref 8.4–10.5)
Chloride: 103 meq/L (ref 96–112)
Creatinine, Ser: 0.55 mg/dL (ref 0.40–1.20)
GFR: 103.47 mL/min (ref >60.00)
Glucose, Bld: 93 mg/dL (ref 70–99)
Potassium: 4.1 meq/L (ref 3.5–5.1)
Sodium: 140 meq/L (ref 135–145)
Total Bilirubin: 0.4 mg/dL (ref 0.2–1.2)
Total Protein: 6.8 g/dL (ref 6.0–8.3)

## 2023-04-07 LAB — HEMOGLOBIN A1C: Hgb A1c MFr Bld: 5.9 % (ref 4.6–6.5)

## 2023-04-07 MED ORDER — AMLODIPINE BESYLATE 5 MG PO TABS: 5.0000 mg | ORAL_TABLET | Freq: Every day | ORAL | 3 refills | Status: DC

## 2023-04-07 NOTE — Progress Notes (Signed)
trop

## 2023-04-07 NOTE — Addendum Note (Signed)
Addended by: Mervin Kung A on: 04/07/2023 11:02 AM   Modules accepted: Orders

## 2023-04-07 NOTE — Telephone Encounter (Signed)
Information given to patient's husband. He reports they picked up medication and she started taking

## 2023-04-07 NOTE — Progress Notes (Signed)
BP Readings from Last 3 Encounters:  04/04/23 (!) 167/90  09/06/22 (!) 149/88  05/18/22 139/72    Pt here for Blood pressure check per Sandford Craze NP  Pt currently takes: amlodipine (Norvasc) 2.5 mg Tablet and valsartan-hydrochlorothiazide (Diovan-HCT) 160-25 mg Tablet   Pt reports compliance with medication.  BP today @ = 144/92 (Left arm) and 150/96 (Right arm) HR = 76 (L) / 68 (R)  Pt advised per Sandford Craze we will increase the Amlodipine to 5 mg and follow up in 1 month.

## 2023-04-08 ENCOUNTER — Other Ambulatory Visit: Payer: Self-pay | Admitting: Family

## 2023-04-08 DIAGNOSIS — N6489 Other specified disorders of breast: Secondary | ICD-10-CM

## 2023-04-27 ENCOUNTER — Other Ambulatory Visit: Payer: Self-pay | Admitting: Family

## 2023-05-07 NOTE — Progress Notes (Unsigned)
 Subjective:     Patient ID: Wanda Williams, female    DOB: 1970/08/23, 53 y.o.   MRN: [id number]  No chief complaint on file.   HPI  Discussed the use of AI scribe software for clinical note transcription with the patient, who gave verbal consent to proceed.  History of Present Illness   Increased amlodipine 2.5- 5mg .  Restarted effexor    Health Maintenance Due  Topic Date Due   COVID-19 Vaccine (3 - 2024-25 season) 11/06/2022    Past Medical History:  Diagnosis Date   Allergy    Angio-edema    Anxiety    Depression    Eczema    HTN (hypertension)    Sleep apnea    uses CPAP nightly    Past Surgical History:  Procedure Laterality Date   BREAST LUMPECTOMY WITH RADIOACTIVE SEED LOCALIZATION Left 11/27/2018   Procedure: RADIOACTIVE SEED GUIDED LEFT BREAST LUMPECTOMY;  Surgeon: Harriette Bouillon, MD;  Location: [facility];  Service: General;  Laterality: Left;   CARPAL TUNNEL RELEASE Right 05/09/2022   Procedure: RIGHT CARPAL TUNNEL RELEASE;  Surgeon: Betha Loa, MD;  Location: [facility];  Service: Orthopedics;  Laterality: Right;  30  MIN   CESAREAN SECTION     x 2   DILATION AND CURETTAGE OF UTERUS     THYROID CYST EXCISION     TUBAL LIGATION  2006    Family History  Problem Relation Age of Onset   Heart disease Father        heart murmur   Parkinson's disease Father    Hypertension Mother    Coronary artery disease Paternal Uncle        2 uncles (both died before 44)   Coronary artery disease Paternal Aunt        MI, died before 72   Melanoma Brother 38       melanoma   Allergic rhinitis Sister    Colon cancer Neg Hx    Rectal cancer Neg Hx    Stomach cancer Neg Hx    Esophageal cancer Neg Hx     Social History   Socioeconomic History   Marital status: Married    Spouse name: Not on file   Number of children: Not on file   Years of education: Not on file   Highest education level: Not on file  Occupational  History   Not on file  Tobacco Use   Smoking status: Never   Smokeless tobacco: Never  Vaping Use   Vaping status: Never Used  Substance and Sexual Activity   Alcohol use: Yes    Alcohol/week: 0.0 standard drinks of alcohol    Comment: occasional wine   Drug use: No   Sexual activity: Yes    Birth control/protection: Surgical, Post-menopausal    Comment: BTL  Other Topics Concern   Not on file  Social History Narrative   Works at [employer]   One daughter age 79   One daughter is age 32   Enjoys cross stitching, playing games on her cell phone.     2 dogs   Completed 12th grade.    Social Drivers of Corporate investment banker Strain: Not on file  Food Insecurity: Not on file  Transportation Needs: Not on file  Physical Activity: Not on file  Stress: Not on file  Social Connections: Not on file  Intimate Partner Violence: Not on file    Outpatient Medications Prior to Visit  Medication Sig Dispense Refill   amLODipine (NORVASC) 5 MG tablet Take 1 tablet (5 mg total) by mouth daily. 90 tablet 3   buPROPion (WELLBUTRIN XL) 150 MG 24 hr tablet TAKE 1 TABLET BY MOUTH DAILY 90 tablet 0   valsartan-hydrochlorothiazide (DIOVAN-HCT) 160-25 MG tablet Take 1 tablet by mouth daily. 90 tablet 1   valsartan-hydrochlorothiazide (DIOVAN-HCT) 160-25 MG tablet Take 1 tablet by mouth daily. 7 tablet 0   venlafaxine XR (EFFEXOR-XR) 150 MG 24 hr capsule Take 1 capsule (150 mg total) by mouth daily with breakfast. 90 capsule 1   venlafaxine XR (EFFEXOR-XR) 150 MG 24 hr capsule Take 1 capsule (150 mg total) by mouth daily with breakfast. 7 capsule 0   No facility-administered medications prior to visit.    Allergies  Allergen Reactions   Ace Inhibitors Cough    ROS     Objective:    Physical Exam   LMP 04/20/2016 (LMP Unknown) Comment: BTL Wt Readings from Last 3 Encounters:  04/04/23 205 lb (93 kg)  09/06/22 203 lb (92.1 kg)  05/18/22 200 lb (90.7 kg)        Assessment & Plan:   Problem List Items Addressed This Visit   None   I am having Wanda Williams maintain her venlafaxine XR, valsartan-hydrochlorothiazide, valsartan-hydrochlorothiazide, venlafaxine XR, amLODipine, and buPROPion.  No orders of the defined types were placed in this encounter.

## 2023-05-08 ENCOUNTER — Ambulatory Visit (INDEPENDENT_AMBULATORY_CARE_PROVIDER_SITE_OTHER): Payer: Managed Care, Other (non HMO) | Admitting: Family

## 2023-05-08 VITALS — BP 135/79 | HR 86 | Temp 98.0°F | Resp 16 | Ht <= 58 in | Wt 203.0 lb

## 2023-05-08 DIAGNOSIS — F418 Other specified anxiety disorders: Secondary | ICD-10-CM

## 2023-05-08 DIAGNOSIS — I10 Essential (primary) hypertension: Secondary | ICD-10-CM

## 2023-05-08 DIAGNOSIS — N898 Other specified noninflammatory disorders of vagina: Secondary | ICD-10-CM | POA: Insufficient documentation

## 2023-05-08 DIAGNOSIS — N3000 Acute cystitis without hematuria: Secondary | ICD-10-CM | POA: Diagnosis not present

## 2023-05-08 MED ORDER — BUPROPION HCL ER (XL) 150 MG PO TB24: 150.0000 mg | ORAL_TABLET | Freq: Every day | ORAL | 0 refills | Status: DC

## 2023-05-08 NOTE — Assessment & Plan Note (Signed)
 Clinically resolved. Monitor for recurrent symptoms.

## 2023-05-08 NOTE — Patient Instructions (Signed)
 VISIT SUMMARY:  Wanda Williams, a 53 year old female, visited for a follow-up on her hypertension and mood disorder. Her blood pressure is well-controlled with her current medications, and her mood is stable with Effexor and Wellbutrin. She also discussed menopausal symptoms and concerns about a possible urinary tract infection.  YOUR PLAN:  -HYPERTENSION: Hypertension, or high blood pressure, is well-controlled with your current medications: Valsartan, Hydrochlorothiazide, and Amlodipine. Continue taking these medications as prescribed. We will check your blood pressure again in 3 months.  -DEPRESSION: Your mood disorder is stable with Effexor and Wellbutrin. Continue taking these medications as prescribed. We will check your mood again in 3 months.  -MENOPAUSAL SYMPTOMS: You are experiencing dryness related to menopause and have started an over-the-counter Meno supplement. If symptoms persist, we may consider using a vaginal estrogen cream. Continue with the Meno supplement for now.  -URINARY SYMPTOMS: You have noticed unclear urine but no other symptoms of a urinary tract infection (UTI). Stay hydrated, and if symptoms worsen, we will recheck your urine.  -GENERAL HEALTH MAINTENANCE: We will schedule a physical exam at your next visit in 3 months. Ensure you have refills available for all your current medications.  INSTRUCTIONS:  Please schedule a physical exam for your next visit in 3 months. Continue taking your medications as prescribed and stay hydrated. If your urinary symptoms worsen, contact us for a recheck.

## 2023-05-08 NOTE — Assessment & Plan Note (Signed)
 We discussed possibility of vaginal estrogen cream if symptoms persist.

## 2023-05-08 NOTE — Assessment & Plan Note (Signed)
 Stable/improved on amlodipine 5mg  and valsartan hydrochlorothiazide 160/25.

## 2023-05-08 NOTE — Assessment & Plan Note (Signed)
 Improved on effexor.  Continue along with wellbutrin.

## 2023-05-30 NOTE — Progress Notes (Unsigned)
 Cardiology Office Note:    Date:  05/31/2023   ID:  Wanda Williams, DOB 19-Apr-1970, MRN [id number]  PCP:  Sandford Craze, NP   [facility] Providers Cardiologist:  Thurmon Fair, MD Cardiology APP:  Marcelino Duster, PA     Referring MD: Sandford Craze, NP   Chief Complaint  Patient presents with   Follow-up    HTN, OSA    History of Present Illness:    Wanda Williams is a 53 y.o. female with a hx of OSA on CPAP, hypertension, depression, morbid obesity, HFpEF with grade 2 DD and mild MR on echo in 2019.  She was seen in the ER for transient slurred speech.  Echocardiogram preserved LVEF, grade 2 DD, mild MR.  POET with no ST changes.  Heart monitor in November 2019 showed sinus rhythm.  Sleep study with moderate obstructive sleep apnea, but insurance denied in hospital CPAP titration. She was started on auto PAP titration.  Maintained on 5 mg amlodipine and valsartan-hydrochlorothiazide 160-25 mg.   Last seen by Dr. Tresa Endo 03/2022 and doing well. She presents today for routine cardiology follow up.   She has been compliant with medications except when she was denied refills by PCP - has elevated BP associated with chest tightness. Since reinstating BP medications, has not had further chest pain. She works at [employer] and is on her feet constantly. Had a bout of CP with a recent stressful event at work.   She has not had chest pain with exertion, but has had stable DOE with climbing stairs multiple times per day. She is also caring for 64 yo granddaughter.    Past Medical History:  Diagnosis Date   Allergy    Angio-edema    Anxiety    Depression    Eczema    HTN (hypertension)    Sleep apnea    uses CPAP nightly    Past Surgical History:  Procedure Laterality Date   BREAST LUMPECTOMY WITH RADIOACTIVE SEED LOCALIZATION Left 11/27/2018   Procedure: RADIOACTIVE SEED GUIDED LEFT BREAST LUMPECTOMY;  Surgeon: Harriette Bouillon, MD;  Location: [facility];  Service: General;  Laterality: Left;   CARPAL TUNNEL RELEASE Right 05/09/2022   Procedure: RIGHT CARPAL TUNNEL RELEASE;  Surgeon: Betha Loa, MD;  Location: [facility];  Service: Orthopedics;  Laterality: Right;  30  MIN   CESAREAN SECTION     x 2   DILATION AND CURETTAGE OF UTERUS     THYROID CYST EXCISION     TUBAL LIGATION  2006    Current Medications: Current Meds  Medication Sig   amLODipine (NORVASC) 10 MG tablet Take 1 tablet (10 mg total) by mouth daily.   buPROPion (WELLBUTRIN XL) 150 MG 24 hr tablet Take 1 tablet (150 mg total) by mouth daily.   rosuvastatin (CRESTOR) 10 MG tablet Take 1 tablet Monday, Wednesdays and Fridays for 7 days, then start taking daily   valsartan-hydrochlorothiazide (DIOVAN-HCT) 160-25 MG tablet Take 1 tablet by mouth daily.   venlafaxine XR (EFFEXOR-XR) 150 MG 24 hr capsule Take 1 capsule (150 mg total) by mouth daily with breakfast.   [DISCONTINUED] amLODipine (NORVASC) 5 MG tablet Take 1 tablet (5 mg total) by mouth daily.     Allergies:   Ace inhibitors   Social History   Socioeconomic History   Marital status: Married    Spouse name: Not on file   Number of children: Not on file   Years of  education: Not on file   Highest education level: Not on file  Occupational History   Not on file  Tobacco Use   Smoking status: Never   Smokeless tobacco: Never  Vaping Use   Vaping status: Never Used  Substance and Sexual Activity   Alcohol use: Yes    Alcohol/week: 0.0 standard drinks of alcohol    Comment: occasional wine   Drug use: No   Sexual activity: Yes    Birth control/protection: Surgical, Post-menopausal    Comment: BTL  Other Topics Concern   Not on file  Social History Narrative   Works at [employer]   One daughter age 12   One daughter is age 36   Enjoys cross stitching, playing games on her cell phone.     2 dogs   Completed 12th grade.    Social Drivers of Research scientist (physical sciences) Strain: Not on file  Food Insecurity: Not on file  Transportation Needs: Not on file  Physical Activity: Not on file  Stress: Not on file  Social Connections: Not on file     Family History: The patient's family history includes Allergic rhinitis in her sister; Coronary artery disease in her paternal aunt and paternal uncle; Heart disease in her father; Hypertension in her mother; Melanoma (age of onset: 13) in her brother; Parkinson's disease in her father. There is no history of Colon cancer, Rectal cancer, Stomach cancer, or Esophageal cancer.  ROS:   Please see the history of present illness.     All other systems reviewed and are negative.  EKGs/Labs/Other Studies Reviewed:    The following studies were reviewed today:  EKG Interpretation Date/Time:  Wednesday May 31 2023 11:25:51 EDT Ventricular Rate:  80 PR Interval:  126 QRS Duration:  84 QT Interval:  406 QTC Calculation: 468 R Axis:   43  Text Interpretation: Normal sinus rhythm Nonspecific T wave abnormality Prolonged QT When compared with ECG of 19-Jan-2021 00:58, No significant change was found Confirmed by Micah Flesher ([id number]) on 05/31/2023 11:27:30 AM    Recent Labs: 04/07/2023: ALT 38; BUN 13; Creatinine, Ser 0.55; Potassium 4.1; Sodium 140  Recent Lipid Panel    Component Value Date/Time   CHOL 161 04/07/2023 1039   CHOL 164 04/06/2022 0956   TRIG 57.0 04/07/2023 1039   HDL 55.20 04/07/2023 1039   HDL 41 04/06/2022 0956   CHOLHDL 3 04/07/2023 1039   VLDL 11.4 04/07/2023 1039   LDLCALC 94 04/07/2023 1039   LDLCALC 97 04/06/2022 0956   LDLDIRECT 107.0 05/12/2021 1017     Risk Assessment/Calculations:          Physical Exam:    VS:  BP (!) 148/80   Pulse 91   Ht 4\' 10"  (1.473 m)   Wt 204 lb 9.6 oz (92.8 kg)   LMP 04/20/2016 (LMP Unknown) Comment: BTL  SpO2 97%   BMI 42.76 kg/m     Wt Readings from Last 3 Encounters:  05/31/23 204 lb 9.6 oz (92.8 kg)  05/08/23 203 lb (92.1 kg)   04/04/23 205 lb (93 kg)     GEN:  Well nourished, well developed in no acute distress HEENT: Normal NECK: No JVD; No carotid bruits LYMPHATICS: No lymphadenopathy CARDIAC: RRR, no murmurs, rubs, gallops RESPIRATORY:  Clear to auscultation without rales, wheezing or rhonchi  ABDOMEN: Soft, non-tender, non-distended MUSCULOSKELETAL:  No edema; No deformity  SKIN: Warm and dry NEUROLOGIC:  Alert and oriented x 3 PSYCHIATRIC:  Normal affect  ASSESSMENT:    1. Essential hypertension   2. Chronic heart failure with preserved ejection fraction (HCC)   3. OSA (obstructive sleep apnea)   4. Depression with anxiety   5. Hyperlipidemia with target LDL less than 70   6. Atypical chest pain    PLAN:    In order of problems listed above:  Hypertension -- hypertensive here - Continue 5 mg amlodipine, valsartan-HCTZ 160-25 mg daily -- increase amlodipine to 10 mg   Chronic diastolic heart failure -- Does not require standing Lasix - continue HCTZ as above -- not volume up -- recommend compression socks given her work, some dependent edema   OSA on CPAP - states Dr. Tresa Endo told her she could get a new CPAP mask after 5 years - will reach out to the coordinator - doing very well, good compliance - some issues with insomnia outside of CPAP   Anxiety/depression - on medications per PCP   Hyperlipidemia with LDL goal < 70 - given risk factors, will start 10 mg crestor - LDL was 94 on 03/2023   Chest pressure - in the setting of running out of HTN medications and stressful event at work, not exertional - suspect PC due to uncontrolled HTN - long conversation regarding weight loss, increasing fiber, and walking program - she will return in 4-5 months- if DOE not improved, will consider CT coronary - she understands to call me if she has worsening symptoms or chest pain with exertion   Follow up in 4-5 months. Will message Minna Antis for new mask.               Medication Adjustments/Labs and Tests Ordered: Current medicines are reviewed at length with the patient today.  Concerns regarding medicines are outlined above.  Orders Placed This Encounter  Procedures   EKG 12-Lead   Meds ordered this encounter  Medications   rosuvastatin (CRESTOR) 10 MG tablet    Sig: Take 1 tablet Monday, Wednesdays and Fridays for 7 days, then start taking daily    Dispense:  90 tablet    Refill:  3   amLODipine (NORVASC) 10 MG tablet    Sig: Take 1 tablet (10 mg total) by mouth daily.    Dispense:  90 tablet    Refill:  3    Patient Instructions  Medication Instructions:  Crestor 10 mg Mondays, Wednesdays,. Fridays for 7 days then start taking daily. Increase Amlodipine 10 mg daily   *If you need a refill on your cardiac medications before your next appointment, please call your pharmacy*   Lab Work:  NONE ordered at this time of appointment   Testing/Procedures: NONE ordered at this time of appointment   Follow-Up: At [facility], you and your health needs are our priority.  As part of our continuing mission to provide you with exceptional heart care, we have created designated Provider Care Teams.  These Care Teams include your primary Cardiologist (physician) and Advanced Practice Providers (APPs -  Physician Assistants and Nurse Practitioners) who all work together to provide you with the care you need, when you need it.  We recommend signing up for the patient portal called "MyChart".  Sign up information is provided on this After Visit Summary.  MyChart is used to connect with patients for Virtual Visits (Telemedicine).  Patients are able to view lab/test results, encounter notes, upcoming appointments, etc.  Non-urgent messages can be sent to your provider as well.   To learn more about what  you can do with MyChart, go to [email or web address].    Your next appointment:   4-5 month(s) Dr. Elliot Gurney Dealie Koelzer PA  Provider:    Dr. Royann Shivers or Micah Flesher PA     Other Instructions        Signed, Marcelino Duster, [place]  05/31/2023 12:22 PM    [facility]

## 2023-05-31 ENCOUNTER — Encounter: Payer: Self-pay | Admitting: Physician Assistant

## 2023-05-31 ENCOUNTER — Ambulatory Visit: Attending: Physician Assistant | Admitting: Physician Assistant

## 2023-05-31 VITALS — BP 148/80 | HR 91 | Ht <= 58 in | Wt 204.6 lb

## 2023-05-31 DIAGNOSIS — R0789 Other chest pain: Secondary | ICD-10-CM

## 2023-05-31 DIAGNOSIS — I10 Essential (primary) hypertension: Secondary | ICD-10-CM | POA: Diagnosis not present

## 2023-05-31 DIAGNOSIS — G4733 Obstructive sleep apnea (adult) (pediatric): Secondary | ICD-10-CM | POA: Diagnosis not present

## 2023-05-31 DIAGNOSIS — E785 Hyperlipidemia, unspecified: Secondary | ICD-10-CM

## 2023-05-31 DIAGNOSIS — F418 Other specified anxiety disorders: Secondary | ICD-10-CM

## 2023-05-31 DIAGNOSIS — I5032 Chronic diastolic (congestive) heart failure: Secondary | ICD-10-CM | POA: Diagnosis not present

## 2023-05-31 MED ORDER — AMLODIPINE BESYLATE 10 MG PO TABS: 10.0000 mg | ORAL_TABLET | Freq: Every day | ORAL | 3 refills | Status: DC

## 2023-05-31 MED ORDER — ROSUVASTATIN CALCIUM 10 MG PO TABS: ORAL_TABLET | ORAL | 3 refills | Status: DC

## 2023-05-31 NOTE — Patient Instructions (Addendum)
 Medication Instructions:  Crestor 10 mg Mondays, Wednesdays,. Fridays for 7 days then start taking daily. Increase Amlodipine 10 mg daily   *If you need a refill on your cardiac medications before your next appointment, please call your pharmacy*   Lab Work:  NONE ordered at this time of appointment   Testing/Procedures: NONE ordered at this time of appointment   Follow-Up: At [facility], you and your health needs are our priority.  As part of our continuing mission to provide you with exceptional heart care, we have created designated Provider Care Teams.  These Care Teams include your primary Cardiologist (physician) and Advanced Practice Providers (APPs -  Physician Assistants and Nurse Practitioners) who all work together to provide you with the care you need, when you need it.  We recommend signing up for the patient portal called "MyChart".  Sign up information is provided on this After Visit Summary.  MyChart is used to connect with patients for Virtual Visits (Telemedicine).  Patients are able to view lab/test results, encounter notes, upcoming appointments, etc.  Non-urgent messages can be sent to your provider as well.   To learn more about what you can do with MyChart, go to [email or web address].    Your next appointment:   4-5 month(s) Dr. Elliot Gurney Duke PA  Provider:   Dr. Royann Shivers or Micah Flesher PA     Other Instructions

## 2023-08-09 ENCOUNTER — Encounter: Admitting: Family

## 2023-08-24 ENCOUNTER — Other Ambulatory Visit: Payer: Self-pay | Admitting: Family

## 2023-08-24 DIAGNOSIS — F418 Other specified anxiety disorders: Secondary | ICD-10-CM

## 2023-10-02 ENCOUNTER — Ambulatory Visit: Attending: Cardiovascular Disease | Admitting: Cardiovascular Disease

## 2023-10-02 ENCOUNTER — Encounter: Payer: Self-pay | Admitting: Cardiovascular Disease

## 2023-10-02 VITALS — BP 122/80 | HR 75 | Ht <= 58 in | Wt 214.2 lb

## 2023-10-02 DIAGNOSIS — G4733 Obstructive sleep apnea (adult) (pediatric): Secondary | ICD-10-CM

## 2023-10-02 NOTE — Progress Notes (Signed)
  Cardiology Office Note   Date:  10/02/2023  ID:  Wanda Williams, DOB [birth year], MRN [id number] PCP: Daryl Setter, NP  [facility] Providers Cardiologist:  Jerel Balding, MD Cardiology APP:  Madie Jon Garre, PA     History of Present Illness Wanda Williams is a 53 y.o. female with a history of morbid obesity, prediabetes, OSA on CPAP, hypertension, HFpEF.  She is transitioning cardiology care from Dr. Burnard after his retirement.  She is feeling well right now.  She has not been troubled by palpitations, chest pain or shortness of breath.  Frustrated by difficulty with weight loss.  She works late hours at the [employer].  Often has to eat late in the evening.  Really no time to get exercise, but she does go up and down stairs multiple times a day at the house.  She and her husband are caring for 3 grandchildren.  Reports compliance with CPAP and denies daytime hypersomnolence.  The patient specifically denies any chest pain at rest or with exertion, dyspnea at rest or with exertion, orthopnea, paroxysmal nocturnal dyspnea, syncope, palpitations, focal neurological deficits, intermittent claudication, lower extremity edema, unexplained weight gain, cough, hemoptysis or wheezing.  She had a near normal echocardiogram in January 2024 (normal LVEF, indeterminate diastolic function parameters, E/e'=8, no LVH, normal left atrial size, normal estimated PAP).  Arrhythmia monitor in 2020 showed normal findings with rare isolated PVCs.  Had a normal treadmill stress test in 2019.  Has not had a coronary calcium  score yet.   Studies Reviewed     Personally reviewed the ECG tracing from 05/31/2023 which shows normal sinus rhythm with nonspecific T wave inversions most clearly seen in leads I, aVL, V4-V6 and borderline prolonged QTc at 468 ms  Risk Assessment/Calculations           Physical Exam VS:  BP 122/80 (BP Location: Left Arm, Patient Position: Sitting, Cuff Size:  Large)   Pulse 75   Ht 4' 10 (1.473 m)   Wt 214 lb 3.2 oz (97.2 kg)   LMP 04/20/2016 (LMP Unknown) Comment: BTL  SpO2 98%   BMI 44.77 kg/m        Wt Readings from Last 3 Encounters:  10/02/23 214 lb 3.2 oz (97.2 kg)  05/31/23 204 lb 9.6 oz (92.8 kg)  05/08/23 203 lb (92.1 kg)    GEN: Well nourished, well developed in no acute distress.  Morbidly obese NECK: No JVD; No carotid bruits CARDIAC: RRR, no murmurs, rubs, gallops RESPIRATORY:  Clear to auscultation without rales, wheezing or rhonchi  ABDOMEN: Soft, non-tender, non-distended EXTREMITIES:  No edema; No deformity   ASSESSMENT AND PLAN HTN: Well-controlled.  Continue same medications (valsartan -hydrochlorothiazide  and amlodipine ). HLP: Started rosuvastatin  about 5 or 6 weeks ago.  Will recheck after another couple of months.  Target LDL preferably less than 70. PreDM: Most recent hemoglobin A1c 5.9%.  This can probably be cured with weight loss.  Normal transaminases, but at risk for NASH.  Discussed dietary changes including the concept of the glycemic index and the general need to reduce the intake of sugary and starchy foods, increasing the intake of unsaturated fat and lean protein. OSA: Compliant with CPAP.  Recommend establishing sleep clinic follow-up with Dr. Shlomo.       Dispo: Continue same medications.  Follow-up in 1 year.  Sleep clinic with Dr. Shlomo.  Signed, Jerel Balding, MD

## 2023-10-02 NOTE — Patient Instructions (Signed)
 Medication Instructions:  No changes *If you need a refill on your cardiac medications before your next appointment, please call your pharmacy*  Lab Work: No orders If you have labs (blood work) drawn today and your tests are completely normal, you will receive your results only by: MyChart Message (if you have MyChart) OR A paper copy in the mail If you have any lab test that is abnormal or we need to change your treatment, we will call you to review the results.  Testing/Procedures: No changes  Follow-Up: At [facility], you and your health needs are our priority.  As part of our continuing mission to provide you with exceptional heart care, our providers are all part of one team.  This team includes your primary Cardiologist (physician) and Advanced Practice Providers or APPs (Physician Assistants and Nurse Practitioners) who all work together to provide you with the care you need, when you need it.  Your next appointment:   1 year(s)  Provider:   Jerel Balding, MD    We recommend signing up for the patient portal called MyChart.  Sign up information is provided on this After Visit Summary.  MyChart is used to connect with patients for Virtual Visits (Telemedicine).  Patients are able to view lab/test results, encounter notes, upcoming appointments, etc.  Non-urgent messages can be sent to your provider as well.   To learn more about what you can do with MyChart, go to [email or web address].

## 2023-11-10 ENCOUNTER — Other Ambulatory Visit: Payer: Self-pay | Admitting: Family

## 2023-11-10 DIAGNOSIS — F418 Other specified anxiety disorders: Secondary | ICD-10-CM

## 2023-12-25 ENCOUNTER — Encounter: Payer: Self-pay | Admitting: Cardiology

## 2023-12-25 ENCOUNTER — Ambulatory Visit: Payer: Self-pay | Attending: Cardiology | Admitting: Cardiology

## 2023-12-25 VITALS — BP 128/82 | HR 83 | Ht <= 58 in | Wt 212.2 lb

## 2023-12-25 DIAGNOSIS — G4733 Obstructive sleep apnea (adult) (pediatric): Secondary | ICD-10-CM

## 2023-12-25 DIAGNOSIS — I10 Essential (primary) hypertension: Secondary | ICD-10-CM

## 2023-12-25 NOTE — Progress Notes (Signed)
 Sleep Medicine  Note    Date:  12/25/2023   ID:  Wanda Williams, DOB [birth year], MRN [id number]  PCP:  Daryl Setter, NP  Cardiologist: Jerel Balding, MD   Chief Complaint  Patient presents with   New Patient (Initial Visit)    OSA    History of Present Illness:  Wanda Williams is a 53 y.o. female with a history of obstructive sleep apnea who has been on CPAP therapy but has not been followed by sleep medicine physician.  She underwent a home sleep study in 2019 which revealed moderate obstructive sleep apnea with an AHI of 25.2/h.  She also had evidence of nocturnal hypoxemia with O2 saturations less than 89% for 23 minutes.  She was started on auto CPAP from 4 to 15 cm H2O.She was followed by Dr. Burnard who has since retired and is now referred to establish sleep care with me.  She is doing well with her PAP device and thinks that she has gotten used to it.  She tolerates the full face mask and feels the pressure is adequate. She has been complaining of dry mouth.  She denies any significant nasal dryness or nasal congestion.  She does not think that he snores. An Epworth Sleepiness Scale score was calculated the office today and this endorsed at 14 c/w residual daytime sleepiness. She feels rested when she gets up in the am for the most part but sometimes feels tired and sluggish. She still get tired during the day and wants to nap but attributes it to her weight.  She goes to bed at 9:30-11:30PM and falls asleep within 10 minutes.  She wakes up and then gets on her phone 2-3 hours later and then falls back asleep and gets up around 6am.  Patient denies any episodes of bruxism, restless legs, No gagging hallucinations or cataplectic events.    Past Medical History:  Diagnosis Date   Allergy    Angio-edema    Anxiety    Depression    Eczema    HTN (hypertension)    Sleep apnea    uses CPAP nightly    Past Surgical History:  Procedure Laterality Date   BREAST  LUMPECTOMY WITH RADIOACTIVE SEED LOCALIZATION Left 11/27/2018   Procedure: RADIOACTIVE SEED GUIDED LEFT BREAST LUMPECTOMY;  Surgeon: Vanderbilt Ned, MD;  Location: [facility];  Service: General;  Laterality: Left;   CARPAL TUNNEL RELEASE Right 05/09/2022   Procedure: RIGHT CARPAL TUNNEL RELEASE;  Surgeon: Murrell Drivers, MD;  Location: [facility];  Service: Orthopedics;  Laterality: Right;  30  MIN   CESAREAN SECTION     x 2   DILATION AND CURETTAGE OF UTERUS     THYROID  CYST EXCISION     TUBAL LIGATION  2006    Current Medications: Current Meds  Medication Sig   amLODipine  (NORVASC ) 10 MG tablet Take 1 tablet (10 mg total) by mouth daily.   buPROPion  (WELLBUTRIN  XL) 150 MG 24 hr tablet Take 1 tablet (150 mg total) by mouth daily. Needs appt   rosuvastatin  (CRESTOR ) 10 MG tablet Take 1 tablet Monday, Wednesdays and Fridays for 7 days, then start taking daily   valsartan -hydrochlorothiazide  (DIOVAN -HCT) 160-25 MG tablet Take 1 tablet by mouth daily. Needs appt   venlafaxine  XR (EFFEXOR -XR) 150 MG 24 hr capsule Take 1 capsule (150 mg total) by mouth daily with breakfast. Needs appt    Allergies:   Ace inhibitors   Social History  Socioeconomic History   Marital status: Married    Spouse name: Not on file   Number of children: Not on file   Years of education: Not on file   Highest education level: Not on file  Occupational History   Not on file  Tobacco Use   Smoking status: Never   Smokeless tobacco: Never  Vaping Use   Vaping status: Never Used  Substance and Sexual Activity   Alcohol use: Yes    Alcohol/week: 0.0 standard drinks of alcohol    Comment: occasional wine   Drug use: No   Sexual activity: Yes    Birth control/protection: Surgical, Post-menopausal    Comment: BTL  Other Topics Concern   Not on file  Social History Narrative   Works at [employer]   One daughter age 32   One daughter is age 75   Enjoys cross stitching,  playing games on her cell phone.     2 dogs   Completed 12th grade.    Social Drivers of Corporate investment banker Strain: Not on file  Food Insecurity: Not on file  Transportation Needs: Not on file  Physical Activity: Not on file  Stress: Not on file  Social Connections: Not on file     Family History:  The patient's family history includes Allergic rhinitis in her sister; Coronary artery disease in her paternal aunt and paternal uncle; Heart disease in her father; Hypertension in her mother; Melanoma (age of onset: 37) in her brother; Parkinson's disease in her father.   ROS:   Please see the history of present illness.    ROS All other systems reviewed and are negative.      No data to display             PHYSICAL EXAM:   VS:  BP 128/82   Pulse 83   Ht 4' 9 (1.448 m)   Wt 212 lb 3.2 oz (96.3 kg)   LMP 04/20/2016 (LMP Unknown) Comment: BTL  SpO2 97%   BMI 45.92 kg/m    GEN: Well nourished, well developed, in no acute distress  HEENT: normal  Neck: no JVD, carotid bruits, or masses Cardiac: RRR; no murmurs, rubs, or gallops,no edema.  Intact distal pulses bilaterally.  Respiratory:  clear to auscultation bilaterally, normal work of breathing GI: soft, nontender, nondistended, + BS MS: no deformity or atrophy  Skin: warm and dry, no rash Neuro:  Alert and Oriented x 3, Strength and sensation are intact Psych: euthymic mood, full affect  Wt Readings from Last 3 Encounters:  12/25/23 212 lb 3.2 oz (96.3 kg)  10/02/23 214 lb 3.2 oz (97.2 kg)  05/31/23 204 lb 9.6 oz (92.8 kg)      Studies/Labs Reviewed:   Home sleep study in 2019, PAP compliance download  Recent Labs: 04/07/2023: ALT 38; BUN 13; Creatinine, Ser 0.55; Potassium 4.1; Sodium 140    Additional studies/ records that were reviewed today include:  none    ASSESSMENT:    1. OSA (obstructive sleep apnea)   2. Essential hypertension      PLAN:  In order of problems listed  above:  OSA - The patient is tolerating PAP therapy well without any problems. The PAP download performed by his DME was personally reviewed and interpreted by me today and showed an AHI of 1.2/hr on auto CPAP from 9-18 cm H2O with 100% compliance in using more than 4 hours nightly.  The patient has been using and benefiting  from PAP use and will continue to benefit from therapy.  -encouraged her to try to adjust her humidity to help with mouth dryness -practice good sleep hygiene with no screen time during the night and go to bed at a consistent time at night  Hypertension - BP controlled on exam today at 128/45mmHg - Continue amlodipine  10 mg daily, valsartan  HCT 160-25 mg daily with as needed refills   Time Spent: 20 minutes total time of encounter, including 15 minutes spent in face-to-face patient care on the date of this encounter. This time includes coordination of care and counseling regarding above mentioned problem list. Remainder of non-face-to-face time involved reviewing chart documents/testing relevant to the patient encounter and documentation in the medical record. I have independently reviewed documentation from referring provider  Medication Adjustments/Labs and Tests Ordered: Current medicines are reviewed at length with the patient today.  Concerns regarding medicines are outlined above.  Medication changes, Labs and Tests ordered today are listed in the Patient Instructions below.  There are no Patient Instructions on file for this visit.   Signed, Wilbert Bihari, MD  12/25/2023 9:06 AM    [facility] [address] [place], [place], [place]  [address] Phone: [phone]; Fax: [phone]

## 2023-12-25 NOTE — Patient Instructions (Signed)
 Medication Instructions:  The current medical regimen is effective;  continue present plan and medications.  *If you need a refill on your cardiac medications before your next appointment, please call your pharmacy*  Follow-Up: At [facility], you and your health needs are our priority.  As part of our continuing mission to provide you with exceptional heart care, our providers are all part of one team.  This team includes your primary Cardiologist (physician) and Advanced Practice Providers or APPs (Physician Assistants and Nurse Practitioners) who all work together to provide you with the care you need, when you need it.  Your next appointment:   1 year(s)  Provider:   Dr Shlomo     We recommend signing up for the patient portal called MyChart.  Sign up information is provided on this After Visit Summary.  MyChart is used to connect with patients for Virtual Visits (Telemedicine).  Patients are able to view lab/test results, encounter notes, upcoming appointments, etc.  Non-urgent messages can be sent to your provider as well.   To learn more about what you can do with MyChart, go to [email or web address].

## 2024-02-07 ENCOUNTER — Ambulatory Visit: Payer: Self-pay

## 2024-02-07 NOTE — Telephone Encounter (Signed)
 FYI Only or Action Required?: FYI only for provider: appointment scheduled on 02/08/2024 at 4pm with Zada Palin NP at [facility].  Patient was last seen in primary care on 05/08/2023 by Daryl Setter, NP.  Called Nurse Triage reporting Hip Pain.  Symptoms began 5 days ago.  Interventions attempted: OTC medications: tylenol , Prescription medications: meloxicam , Rest, hydration, or home remedies, and Ice/heat application.  Symptoms are: gradually worsening.  Triage Disposition: See PCP When Office is Open (Within 3 Days)  Patient/caregiver understands and will follow disposition?: Yes                Copied from CRM [id number]. Topic: Clinical - Red Word Triage >> Feb 07, 2024  1:28 PM Harlene ORN wrote: Red Word that prompted transfer to Nurse Triage: excruciating hip pain to the point of tears/ wants a cortisone shot Reason for Disposition  [1] MODERATE pain (e.g., interferes with normal activities, limping) AND [2] present > 3 days  Answer Assessment - Initial Assessment Questions Patient's husband called for patient--patient having left hip pain radiating down into the left knee for five days No injuries, fevers, changes in bladder  Patient has tried heat and ice, massager, meloxicam  Patient is working from [employer] until [employer] 02/08/2024 patient is working until about 4pm Husband states that patient is always on her feet at work. Husband called the patient on three-way Patient denies any injuries, redness, swelling, changes in bladder control  No appointments available at PCP office when patient is available and she needed an appointment tomorrow after 3 due to work. Patient wanted to be seen tomorrow if possible after 3pm Appointment made at [facility]  Patient is advised to call us  back if anything changes or with any further questions/concerns. Patient is advised that if anything worsens to go to the Emergency Room. Patient  verbalized understanding.  Protocols used: Hip Pain-A-AH

## 2024-02-07 NOTE — Telephone Encounter (Signed)
 Patient has an appointment with another provider. Just fyi

## 2024-02-08 ENCOUNTER — Ambulatory Visit: Admitting: Medical-Surgical

## 2024-02-09 ENCOUNTER — Other Ambulatory Visit: Payer: Self-pay | Admitting: Family

## 2024-02-09 DIAGNOSIS — F418 Other specified anxiety disorders: Secondary | ICD-10-CM

## 2024-02-09 DIAGNOSIS — I10 Essential (primary) hypertension: Secondary | ICD-10-CM

## 2024-02-09 NOTE — Telephone Encounter (Signed)
 Please contact pt to set up a follow up visit.  I sent a 30 day supply of her medication.

## 2024-02-12 NOTE — Telephone Encounter (Signed)
 Called patient but no answer, left voice mail for patient to call back.

## 2024-02-16 NOTE — Telephone Encounter (Signed)
 Patient will be added to schedued 12/22

## 2024-02-26 ENCOUNTER — Ambulatory Visit: Payer: Self-pay | Admitting: Family

## 2024-02-26 ENCOUNTER — Other Ambulatory Visit (HOSPITAL_BASED_OUTPATIENT_CLINIC_OR_DEPARTMENT_OTHER): Payer: Self-pay

## 2024-02-26 ENCOUNTER — Ambulatory Visit: Admitting: Family

## 2024-02-26 VITALS — BP 146/84 | HR 80 | Temp 97.8°F | Resp 16 | Ht <= 58 in | Wt 214.0 lb

## 2024-02-26 DIAGNOSIS — M5432 Sciatica, left side: Secondary | ICD-10-CM

## 2024-02-26 DIAGNOSIS — N950 Postmenopausal bleeding: Secondary | ICD-10-CM

## 2024-02-26 DIAGNOSIS — R3129 Other microscopic hematuria: Secondary | ICD-10-CM

## 2024-02-26 DIAGNOSIS — Z1231 Encounter for screening mammogram for malignant neoplasm of breast: Secondary | ICD-10-CM

## 2024-02-26 DIAGNOSIS — R739 Hyperglycemia, unspecified: Secondary | ICD-10-CM

## 2024-02-26 DIAGNOSIS — I10 Essential (primary) hypertension: Secondary | ICD-10-CM

## 2024-02-26 DIAGNOSIS — M542 Cervicalgia: Secondary | ICD-10-CM

## 2024-02-26 LAB — HEMOGLOBIN A1C: Hgb A1c MFr Bld: 6.3 % (ref 4.6–6.5)

## 2024-02-26 LAB — COMPREHENSIVE METABOLIC PANEL WITH GFR
ALT: 23 U/L (ref 3–35)
AST: 20 U/L (ref 5–37)
Albumin: 4.1 g/dL (ref 3.5–5.2)
Alkaline Phosphatase: 82 U/L (ref 39–117)
BUN: 15 mg/dL (ref 6–23)
CO2: 29 meq/L (ref 19–32)
Calcium: 8.7 mg/dL (ref 8.4–10.5)
Chloride: 104 meq/L (ref 96–112)
Creatinine, Ser: 0.51 mg/dL (ref 0.40–1.20)
GFR: 104.72 mL/min
Glucose, Bld: 142 mg/dL — ABNORMAL HIGH (ref 70–99)
Potassium: 3.8 meq/L (ref 3.5–5.1)
Sodium: 142 meq/L (ref 135–145)
Total Bilirubin: 0.2 mg/dL (ref 0.2–1.2)
Total Protein: 6.7 g/dL (ref 6.0–8.3)

## 2024-02-26 LAB — URINALYSIS, ROUTINE W REFLEX MICROSCOPIC
Bilirubin Urine: NEGATIVE
Hgb urine dipstick: NEGATIVE
Ketones, ur: NEGATIVE
Leukocytes,Ua: NEGATIVE
Nitrite: NEGATIVE
Specific Gravity, Urine: >=1.03 — AB (ref 1.000–1.030)
Urine Glucose: NEGATIVE
Urobilinogen, UA: 0.2 (ref 0.0–1.0)
pH: 6 (ref 5.0–8.0)

## 2024-02-26 MED ORDER — GABAPENTIN 100 MG PO CAPS
100.0000 mg | ORAL_CAPSULE | Freq: Three times a day (TID) | ORAL | 0 refills | Status: AC
  Filled 2024-02-26: qty 90, 30d supply, fill #0

## 2024-02-26 MED ORDER — METHYLPREDNISOLONE 4 MG PO TBPK
ORAL_TABLET | ORAL | 0 refills | Status: DC
  Filled 2024-02-26: qty 21, 6d supply, fill #0

## 2024-02-26 NOTE — Assessment & Plan Note (Signed)
" °  Intermittent postmenopausal bleeding occurring daily, not associated with intercourse. No current gynecological care. - Ordered urinalysis to check for microscopic blood. - Referred to gynecology for further evaluation, including possible ultrasound and endometrial biopsy. "

## 2024-02-26 NOTE — Patient Instructions (Signed)
" °  VISIT SUMMARY: Today, we discussed your persistent sciatic pain, left knee pain, and occasional postmenopausal bleeding. We reviewed your current medications and made some adjustments to help manage your symptoms. We also discussed the need for further evaluations and follow-ups to ensure your overall health and well-being.  YOUR PLAN: -SCIATICA AND LEFT KNEE PAIN: Sciatica is pain that radiates along the path of the sciatic nerve, which runs from your lower back through your hips and buttocks and down each leg. Your pain is likely due to nerve pain and possible knee arthritis. We have prescribed a Medrol  Dosepak to reduce inflammation and gabapentin  to manage nerve pain. You should take 100 mg of gabapentin  in the morning, 100 mg in the afternoon, and 300 mg at bedtime. We recommend taking a few days off work to rest. If your symptoms persist after six weeks of conservative treatment, we will consider an MRI.  -POSTMENOPAUSAL BLEEDING: Postmenopausal bleeding is any vaginal bleeding that occurs after you have stopped having menstrual periods. We have ordered a urinalysis to check for microscopic blood and referred you to gynecology for further evaluation, which may include an ultrasound and endometrial biopsy.  -ESSENTIAL HYPERTENSION: Essential hypertension is high blood pressure with no identifiable cause. Your blood pressure is currently elevated, likely due to pain. You are already taking amlodipine  and valsartan -hydrochlorothiazide , and we will not make any changes to your regimen at this time. We will recheck your blood pressure in one month to see if it improves after managing your pain.  -GENERAL HEALTH MAINTENANCE: We need to reschedule your mammogram, which was previously canceled. We have also ensured that your gynecology referral is in place for further evaluation. Regular screenings and evaluations are important for maintaining your overall health.  INSTRUCTIONS: Please take the  prescribed medications as directed and take a few days off work to rest. Schedule your mammogram and follow up with the gynecology referral for further evaluation. We will recheck your blood pressure in one month. If your sciatic pain and knee pain persist after six weeks, we will consider an MRI.                                        "

## 2024-02-26 NOTE — Assessment & Plan Note (Signed)
" °  Blood pressure elevated, likely due to pain. Current medications include amlodipine  and valsartan -hydrochlorothiazide . No changes to regimen due to pain-related elevation. - Will recheck blood pressure in one month to assess control after pain management. "

## 2024-02-26 NOTE — Progress Notes (Signed)
 "  Subjective:     Patient ID: Wanda Williams, female    DOB: 1970/08/22, 53 y.o.   MRN: [id number]  Chief Complaint  Patient presents with   Leg Pain    Patient complains of left leg pain, sciatic pain    Leg Pain     Discussed the use of AI scribe software for clinical note transcription with the patient, who gave verbal consent to proceed.  History of Present Illness Wanda Williams is a 53 year old female who presents with persistent sciatic pain radiating from the buttock to the knee.  She has been experiencing pain for four weeks, which she described as possibly related to a 'psoriatic nerve' issue. The pain radiates from the buttock down to the knee. She visited the ER where she received a shot, ibuprofen  800 mg, and a muscle spasm pill, but the pain persists. She describes a popping sensation in her knee when climbing stairs and a burning sensation after standing for eight hours at work. Despite the pain, she continues to work, although she was given three days off initially.  The pain has not improved with rest, and she is experiencing difficulty standing at work, leading to a nickname of 'penguin' due to her altered gait. She has been taking ibuprofen , which causes stomach discomfort, and is concerned about the impact of her pain on her ability to work safely, particularly around machinery.  She reports occasional vaginal bleeding when wiping, which she describes as a small amount of blood at the tip of her urethra. This occurs sporadically and is not enough to stain her underwear. She has not had a normal menstrual period since she was 53 years old. She does not have a gynecologist and has not had significant skin issues since healing from previous conditions.  She has a history of a positive strep test on December 4th, which has since resolved. She mentions dietary changes, including reducing greasy foods and meat, and adopting a semi-vegetarian diet.      Health  Maintenance Due  Topic Date Due   Pneumococcal Vaccine: 50+ Years (1 of 2 - PCV) Never done   Hepatitis B Vaccines 19-59 Average Risk (1 of 3 - 19+ 3-dose series) Never done   Mammogram  09/13/2023   COVID-19 Vaccine (3 - 2025-26 season) 11/06/2023    Past Medical History:  Diagnosis Date   Allergy    Angio-edema    Anxiety    Depression    Eczema    HTN (hypertension)    Sleep apnea    uses CPAP nightly    Past Surgical History:  Procedure Laterality Date   BREAST LUMPECTOMY WITH RADIOACTIVE SEED LOCALIZATION Left 11/27/2018   Procedure: RADIOACTIVE SEED GUIDED LEFT BREAST LUMPECTOMY;  Surgeon: Vanderbilt Ned, MD;  Location: [facility];  Service: General;  Laterality: Left;   CARPAL TUNNEL RELEASE Right 05/09/2022   Procedure: RIGHT CARPAL TUNNEL RELEASE;  Surgeon: Murrell Drivers, MD;  Location: [facility];  Service: Orthopedics;  Laterality: Right;  30  MIN   CESAREAN SECTION     x 2   DILATION AND CURETTAGE OF UTERUS     THYROID  CYST EXCISION     TUBAL LIGATION  2006    Family History  Problem Relation Age of Onset   Heart disease Father        heart murmur   Parkinson's disease Father    Hypertension Mother    Coronary artery disease Paternal Uncle  2 uncles (both died before 75)   Coronary artery disease Paternal Aunt        MI, died before 83   Melanoma Brother 79       melanoma   Allergic rhinitis Sister    Colon cancer Neg Hx    Rectal cancer Neg Hx    Stomach cancer Neg Hx    Esophageal cancer Neg Hx     Social History   Socioeconomic History   Marital status: Married    Spouse name: Not on file   Number of children: Not on file   Years of education: Not on file   Highest education level: 12th grade  Occupational History   Not on file  Tobacco Use   Smoking status: Never   Smokeless tobacco: Never  Vaping Use   Vaping status: Never Used  Substance and Sexual Activity   Alcohol use: Yes    Alcohol/week: 0.0  standard drinks of alcohol    Comment: occasional wine   Drug use: No   Sexual activity: Yes    Birth control/protection: Surgical, Post-menopausal    Comment: BTL  Other Topics Concern   Not on file  Social History Narrative   Works at [employer]   One daughter age 19   One daughter is age 64   Enjoys cross stitching, playing games on her cell phone.     2 dogs   Completed 12th grade.    Social Drivers of Health   Tobacco Use: Low Risk (12/25/2023)   Patient History    Smoking Tobacco Use: Never    Smokeless Tobacco Use: Never    Passive Exposure: Not on file  Financial Resource Strain: Medium Risk (02/25/2024)   Overall Financial Resource Strain (CARDIA)    Difficulty of Paying Living Expenses: Somewhat hard  Food Insecurity: Food Insecurity Present (02/25/2024)   Epic    Worried About Programme Researcher, Broadcasting/film/video in the Last Year: Sometimes true    Ran Out of Food in the Last Year: Never true  Transportation Needs: No Transportation Needs (02/25/2024)   Epic    Lack of Transportation (Medical): No    Lack of Transportation (Non-Medical): No  Physical Activity: Insufficiently Active (02/25/2024)   Exercise Vital Sign    Days of Exercise per Week: 1 day    Minutes of Exercise per Session: 10 min  Stress: Stress Concern Present (02/25/2024)   [employer] of Occupational Health - Occupational Stress Questionnaire    Feeling of Stress: To some extent  Social Connections: Socially Integrated (02/25/2024)   Social Connection and Isolation Panel    Frequency of Communication with Friends and Family: More than three times a week    Frequency of Social Gatherings with Friends and Family: Once a week    Attends Religious Services: More than 4 times per year    Active Member of Clubs or Organizations: Yes    Attends Banker Meetings: More than 4 times per year    Marital Status: Married  Catering Manager Violence: Not on file  Depression (PHQ2-9): Medium Risk  (02/26/2024)   Depression (PHQ2-9)    PHQ-2 Score: 6  Alcohol Screen: Low Risk (02/25/2024)   Alcohol Screen    Last Alcohol Screening Score (AUDIT): 1  Housing: Low Risk (02/25/2024)   Epic    Unable to Pay for Housing in the Last Year: No    Number of Times Moved in the Last Year: 0    Homeless in the Last  Year: No  Utilities: Not on file  Health Literacy: Not on file    Outpatient Medications Prior to Visit  Medication Sig Dispense Refill   amLODipine  (NORVASC ) 10 MG tablet Take 1 tablet (10 mg total) by mouth daily. 90 tablet 3   buPROPion  (WELLBUTRIN  XL) 150 MG 24 hr tablet TAKE 1 TABLET BY MOUTH DAILY  NEEDS APPOINTMENT 30 tablet 0   rosuvastatin  (CRESTOR ) 10 MG tablet Take 1 tablet Monday, Wednesdays and Fridays for 7 days, then start taking daily 90 tablet 3   valsartan -hydrochlorothiazide  (DIOVAN -HCT) 160-25 MG tablet TAKE 1 TABLET BY MOUTH DAILY 30 tablet 0   venlafaxine  XR (EFFEXOR -XR) 150 MG 24 hr capsule TAKE 1 CAPSULE BY MOUTH DAILY  WITH BREAKFAST 30 capsule 0   No facility-administered medications prior to visit.    Allergies[1]  ROS See HPI    Objective:    Physical Exam Constitutional:      General: She is not in acute distress.    Appearance: Normal appearance. She is well-developed.     Comments: Appears to be in pain  HENT:     Head: Normocephalic and atraumatic.     Right Ear: External ear normal.     Left Ear: External ear normal.  Eyes:     General: No scleral icterus. Neck:     Thyroid : No thyromegaly.  Cardiovascular:     Rate and Rhythm: Normal rate and regular rhythm.     Heart sounds: Normal heart sounds. No murmur heard. Pulmonary:     Effort: Pulmonary effort is normal. No respiratory distress.     Breath sounds: Normal breath sounds. No wheezing.  Musculoskeletal:     Cervical back: Neck supple.  Skin:    General: Skin is warm and dry.  Neurological:     Mental Status: She is alert and oriented to person, place, and time.   Psychiatric:        Mood and Affect: Mood normal.        Behavior: Behavior normal.        Thought Content: Thought content normal.        Judgment: Judgment normal.      BP (!) 146/84 (BP Location: Right Arm, Patient Position: Sitting, Cuff Size: Large)   Pulse 80   Temp 97.8 F (36.6 C) (Oral)   Resp 16   Ht 4' 9 (1.448 m)   Wt 214 lb (97.1 kg)   LMP 04/20/2016 Comment: BTL  SpO2 99%   BMI 46.31 kg/m  Wt Readings from Last 3 Encounters:  02/26/24 214 lb (97.1 kg)  12/25/23 212 lb 3.2 oz (96.3 kg)  10/02/23 214 lb 3.2 oz (97.2 kg)       Assessment & Plan:   Problem List Items Addressed This Visit       Unprioritized   Sciatica of left side - Primary    Chronic sciatica and left knee pain for four weeks, exacerbated by standing and stair climbing. Previous treatments provided limited relief. Pain likely due to nerve pain and possible knee arthritis. MRI pending insurance approval after six weeks of conservative treatment. - Prescribed Medrol  Dosepak for inflammation. - Prescribed gabapentin  100 mg in the morning, 100 mg in the afternoon, and 300 mg at bedtime for nerve pain. - Advised taking a few days off work to rest. - Discussed potential for MRI after six weeks of conservative treatment if symptoms persist.      Relevant Medications   methylPREDNISolone  (MEDROL  DOSEPAK) 4 MG TBPK  tablet   gabapentin  (NEURONTIN ) 100 MG capsule   Post-menopausal bleeding    Intermittent postmenopausal bleeding occurring daily, not associated with intercourse. No current gynecological care. - Ordered urinalysis to check for microscopic blood. - Referred to gynecology for further evaluation, including possible ultrasound and endometrial biopsy.      Relevant Orders   Ambulatory referral to Obstetrics / Gynecology   Essential hypertension    Blood pressure elevated, likely due to pain. Current medications include amlodipine  and valsartan -hydrochlorothiazide . No changes to  regimen due to pain-related elevation. - Will recheck blood pressure in one month to assess control after pain management.      Other Visit Diagnoses       Microscopic hematuria       Relevant Orders   Urinalysis, Routine w reflex microscopic     Breast cancer screening by mammogram       Relevant Orders   MM 3D SCREENING MAMMOGRAM BILATERAL BREAST     Hyperglycemia       Relevant Orders   HgB A1c   Comp Met (CMET)       General health maintenance Mammogram previously canceled, needs rescheduling. No recent gynecological evaluations. - Ordered mammogram and advised scheduling at the lab. - Ensured gynecology referral is in place for further evaluation. Assessment & Plan     I am having Wanda Williams start on methylPREDNISolone  and gabapentin . I am also having her maintain her rosuvastatin , amLODipine , valsartan -hydrochlorothiazide , venlafaxine  XR, and buPROPion .  Meds ordered this encounter  Medications   methylPREDNISolone  (MEDROL  DOSEPAK) 4 MG TBPK tablet    Sig: Take per package instructions.    Dispense:  21 tablet    Refill:  0    Supervising Provider:   DOMENICA BLACKBIRD A [[id number]]   gabapentin  (NEURONTIN ) 100 MG capsule    Sig: Take 1 capsule (100 mg total) by mouth 3 (three) times daily.    Dispense:  150 capsule    Refill:  0    Supervising Provider:   DOMENICA BLACKBIRD A [[id number]]      [1]  Allergies Allergen Reactions   Ace Inhibitors Cough   "

## 2024-02-26 NOTE — Assessment & Plan Note (Signed)
" °  Chronic sciatica and left knee pain for four weeks, exacerbated by standing and stair climbing. Previous treatments provided limited relief. Pain likely due to nerve pain and possible knee arthritis. MRI pending insurance approval after six weeks of conservative treatment. - Prescribed Medrol  Dosepak for inflammation. - Prescribed gabapentin  100 mg in the morning, 100 mg in the afternoon, and 300 mg at bedtime for nerve pain. - Advised taking a few days off work to rest. - Discussed potential for MRI after six weeks of conservative treatment if symptoms persist. "

## 2024-03-26 NOTE — Addendum Note (Signed)
 Addended by: DARYL SETTER on: 03/26/2024 08:36 AM   Modules accepted: Orders

## 2024-04-10 ENCOUNTER — Ambulatory Visit: Payer: Self-pay | Admitting: Family

## 2024-04-10 VITALS — BP 119/70 | HR 82 | Temp 97.9°F | Resp 16 | Ht <= 58 in | Wt 210.0 lb

## 2024-04-10 DIAGNOSIS — F418 Other specified anxiety disorders: Secondary | ICD-10-CM

## 2024-04-10 DIAGNOSIS — M1712 Unilateral primary osteoarthritis, left knee: Secondary | ICD-10-CM | POA: Insufficient documentation

## 2024-04-10 DIAGNOSIS — E785 Hyperlipidemia, unspecified: Secondary | ICD-10-CM | POA: Insufficient documentation

## 2024-04-10 DIAGNOSIS — R739 Hyperglycemia, unspecified: Secondary | ICD-10-CM | POA: Insufficient documentation

## 2024-04-10 DIAGNOSIS — I10 Essential (primary) hypertension: Secondary | ICD-10-CM

## 2024-04-10 DIAGNOSIS — M5432 Sciatica, left side: Secondary | ICD-10-CM

## 2024-04-10 DIAGNOSIS — R928 Other abnormal and inconclusive findings on diagnostic imaging of breast: Secondary | ICD-10-CM

## 2024-04-10 MED ORDER — ROSUVASTATIN CALCIUM 10 MG PO TABS: ORAL_TABLET | ORAL | 3 refills | Status: AC

## 2024-04-10 MED ORDER — VENLAFAXINE HCL ER 150 MG PO CP24: 150.0000 mg | ORAL_CAPSULE | Freq: Every day | ORAL | 1 refills | Status: AC

## 2024-04-10 MED ORDER — VALSARTAN-HYDROCHLOROTHIAZIDE 160-25 MG PO TABS: 1.0000 | ORAL_TABLET | Freq: Every day | ORAL | 1 refills | Status: AC

## 2024-04-10 MED ORDER — AMLODIPINE BESYLATE 10 MG PO TABS: 10.0000 mg | ORAL_TABLET | Freq: Every day | ORAL | 1 refills | Status: AC

## 2024-04-10 MED ORDER — BUPROPION HCL ER (XL) 150 MG PO TB24: 150.0000 mg | ORAL_TABLET | Freq: Every day | ORAL | 1 refills | Status: AC

## 2024-04-10 NOTE — Assessment & Plan Note (Signed)
 Lab Results  Component Value Date   CHOL 161 04/07/2023   HDL 55.20 04/07/2023   LDLCALC 94 04/07/2023   LDLDIRECT 107.0 05/12/2021   TRIG 57.0 04/07/2023   CHOLHDL 3 04/07/2023

## 2024-04-10 NOTE — Assessment & Plan Note (Signed)
 Mood appears stable on wellbutrin /effexor .

## 2024-04-10 NOTE — Patient Instructions (Signed)
" °  VISIT SUMMARY: During your visit, we discussed your ongoing pain from sciatica, knee arthritis, and lower back issues. We also reviewed your blood pressure management, recent borderline diabetes test, and the need for a diagnostic mammogram. We addressed your medication schedule and prescription management.  YOUR PLAN: -ABNORMAL MAMMOGRAM: An abnormal mammogram means that there were unusual findings in your breast tissue that need further evaluation. We have ordered a diagnostic mammogram for both breasts, and you should schedule this at the breast center as soon as possible.  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 Degenerative disc disease and scoliosis are conditions that affect the spine and can contribute to this pain. You should continue taking gabapentin  as needed for pain management and follow up with orthopedics for further evaluation. If the pain persists, we may consider an MRI.  -KNEE OSTEOARTHRITIS: Knee osteoarthritis is a condition where the cartilage in your knee joint wears down, causing pain and stiffness. You should continue with physical therapy and follow up with orthopedics for further management.  -ESSENTIAL HYPERTENSION: Essential hypertension is high blood pressure with no identifiable cause. Your blood pressure is well-controlled, but you need to establish a consistent medication schedule, taking your medications daily and avoiding bedtime due to the diuretic component.  -PREDIABETES: Prediabetes means your blood sugar levels are higher than normal but not yet high enough to be diagnosed as diabetes. Continue with your healthy diet and exercise regimen, and monitor your blood glucose levels regularly.  -GENERAL HEALTH MAINTENANCE: We discussed managing your prescriptions due to a change in your pharmacy provider. Your prescriptions have been  sent to [facility] for home delivery. Please contact [facility] for any prescription management needs.  INSTRUCTIONS: Please schedule your diagnostic mammogram at the breast center. Continue taking gabapentin  as needed and follow up with orthopedics for your sciatica and knee osteoarthritis. Maintain a consistent schedule for your blood pressure medications, and continue your healthy diet and exercise regimen for prediabetes. Contact [facility] for any prescription management needs.    Contains text generated by [employer]bridge.   "

## 2024-04-10 NOTE — Progress Notes (Signed)
 "  Subjective:     Patient ID: Wanda Williams, female    DOB: 1970-08-21, 54 y.o.   MRN: [id number]  Chief Complaint  Patient presents with   Hypertension    Here for follow up   Sciatica    Still having left sciatica pain     Hypertension    Discussed the use of AI scribe software for clinical note transcription with the patient, who gave verbal consent to proceed.  History of Present Illness Wanda Williams is a 54 year old female with sciatica and knee arthritis who presents with persistent pain despite prior treatments. She is accompanied by her mother.  She experiences ongoing sciatica pain despite previous treatment with a Medrol  Dose Pack and gabapentin , which provided minimal relief. She has been taking gabapentin  three times a day until her supply ran out. The pain is particularly severe in the hip, becoming excruciating if she lies on that side for more than five to ten minutes. She uses ibuprofen  800 mg for pain relief, which helps her sleep when the pain is severe.  She describes severe arthritis in the front of her knee, identified as 'bone on bone,' while the back of the knee remains unaffected. This condition results in pain localized to the front of the knee.  She reports being told she has degenerative disc disease and scoliosis, and experiences persistent lower back pain. She reports that x-rays showed scoliosis starting halfway down her back. She experiences persistent lower back pain and reports being told that the pain is related to the L5 into the tailbone region.  She mentions arthritis in her fingers and knees, contributing to her pain. She is uncertain about a referral for neck and shoulder x-rays, which she associates with her arthritis.  She is currently taking amlodipine  and valsartan  HCT for blood pressure management, though she struggles with maintaining a consistent medication schedule. She sometimes takes her medication late in the day, which affects  her routine.  She reports a borderline diabetes test result from a month ago and is working on maintaining a healthy diet and exercise regimen. She has lost two pounds recently.      Health Maintenance Due  Topic Date Due   Pneumococcal Vaccine: 50+ Years (1 of 2 - PCV) Never done   Hepatitis B Vaccines 19-59 Average Risk (1 of 3 - 19+ 3-dose series) Never done   Mammogram  09/13/2023   COVID-19 Vaccine (3 - 2025-26 season) 11/06/2023    Past Medical History:  Diagnosis Date   Allergy    Angio-edema    Anxiety    Depression    Eczema    HTN (hypertension)    Sleep apnea    uses CPAP nightly    Past Surgical History:  Procedure Laterality Date   BREAST LUMPECTOMY WITH RADIOACTIVE SEED LOCALIZATION Left 11/27/2018   Procedure: RADIOACTIVE SEED GUIDED LEFT BREAST LUMPECTOMY;  Surgeon: Vanderbilt Ned, MD;  Location: [facility];  Service: General;  Laterality: Left;   CARPAL TUNNEL RELEASE Right 05/09/2022   Procedure: RIGHT CARPAL TUNNEL RELEASE;  Surgeon: Murrell Drivers, MD;  Location: [facility];  Service: Orthopedics;  Laterality: Right;  30  MIN   CESAREAN SECTION     x 2   DILATION AND CURETTAGE OF UTERUS     THYROID  CYST EXCISION     TUBAL LIGATION  2006    Family History  Problem Relation Age of Onset   Heart disease Father  heart murmur   Parkinson's disease Father    Hypertension Mother    Coronary artery disease Paternal Uncle        2 uncles (both died before 42)   Coronary artery disease Paternal Aunt        MI, died before 62   Melanoma Brother 74       melanoma   Allergic rhinitis Sister    Colon cancer Neg Hx    Rectal cancer Neg Hx    Stomach cancer Neg Hx    Esophageal cancer Neg Hx     Social History   Socioeconomic History   Marital status: Married    Spouse name: Not on file   Number of children: Not on file   Years of education: Not on file   Highest education level: 12th grade  Occupational History    Not on file  Tobacco Use   Smoking status: Never   Smokeless tobacco: Never  Vaping Use   Vaping status: Never Used  Substance and Sexual Activity   Alcohol use: Yes    Alcohol/week: 0.0 standard drinks of alcohol    Comment: occasional wine   Drug use: No   Sexual activity: Yes    Birth control/protection: Surgical, Post-menopausal    Comment: BTL  Other Topics Concern   Not on file  Social History Narrative   Works at [employer]   One daughter age 24   One daughter is age 37   Enjoys cross stitching, playing games on her cell phone.     2 dogs   Completed 12th grade.    Social Drivers of Health   Tobacco Use: Low Risk (12/25/2023)   Patient History    Smoking Tobacco Use: Never    Smokeless Tobacco Use: Never    Passive Exposure: Not on file  Financial Resource Strain: Medium Risk (02/25/2024)   Overall Financial Resource Strain (CARDIA)    Difficulty of Paying Living Expenses: Somewhat hard  Food Insecurity: Food Insecurity Present (02/25/2024)   Epic    Worried About Programme Researcher, Broadcasting/film/video in the Last Year: Sometimes true    Ran Out of Food in the Last Year: Never true  Transportation Needs: No Transportation Needs (02/25/2024)   Epic    Lack of Transportation (Medical): No    Lack of Transportation (Non-Medical): No  Physical Activity: Insufficiently Active (02/25/2024)   Exercise Vital Sign    Days of Exercise per Week: 1 day    Minutes of Exercise per Session: 10 min  Stress: Stress Concern Present (02/25/2024)   [employer] of Occupational Health - Occupational Stress Questionnaire    Feeling of Stress: To some extent  Social Connections: Socially Integrated (02/25/2024)   Social Connection and Isolation Panel    Frequency of Communication with Friends and Family: More than three times a week    Frequency of Social Gatherings with Friends and Family: Once a week    Attends Religious Services: More than 4 times per year    Active Member of Clubs  or Organizations: Yes    Attends Banker Meetings: More than 4 times per year    Marital Status: Married  Catering Manager Violence: Not on file  Depression (PHQ2-9): Medium Risk (02/26/2024)   Depression (PHQ2-9)    PHQ-2 Score: 6  Alcohol Screen: Low Risk (02/25/2024)   Alcohol Screen    Last Alcohol Screening Score (AUDIT): 1  Housing: Low Risk (02/25/2024)   Epic    Unable to  Pay for Housing in the Last Year: No    Number of Times Moved in the Last Year: 0    Homeless in the Last Year: No  Utilities: Not on file  Health Literacy: Not on file    Outpatient Medications Prior to Visit  Medication Sig Dispense Refill   gabapentin  (NEURONTIN ) 100 MG capsule Take 1 capsule (100 mg total) by mouth 3 (three) times daily. 150 capsule 0   amLODipine  (NORVASC ) 10 MG tablet Take 1 tablet (10 mg total) by mouth daily. 90 tablet 3   buPROPion  (WELLBUTRIN  XL) 150 MG 24 hr tablet TAKE 1 TABLET BY MOUTH DAILY  NEEDS APPOINTMENT 30 tablet 0   rosuvastatin  (CRESTOR ) 10 MG tablet Take 1 tablet Monday, Wednesdays and Fridays for 7 days, then start taking daily 90 tablet 3   valsartan -hydrochlorothiazide  (DIOVAN -HCT) 160-25 MG tablet TAKE 1 TABLET BY MOUTH DAILY 30 tablet 0   venlafaxine  XR (EFFEXOR -XR) 150 MG 24 hr capsule TAKE 1 CAPSULE BY MOUTH DAILY  WITH BREAKFAST 30 capsule 0   methylPREDNISolone  (MEDROL  DOSEPAK) 4 MG TBPK tablet Take per package instructions. 21 tablet 0   No facility-administered medications prior to visit.    Allergies[1]  ROS See HPI    Objective:    Physical Exam Constitutional:      General: She is not in acute distress.    Appearance: Normal appearance. She is well-developed.  HENT:     Head: Normocephalic and atraumatic.     Right Ear: External ear normal.     Left Ear: External ear normal.  Eyes:     General: No scleral icterus. Neck:     Thyroid : No thyromegaly.  Cardiovascular:     Rate and Rhythm: Normal rate and regular rhythm.      Heart sounds: Normal heart sounds. No murmur heard. Pulmonary:     Effort: Pulmonary effort is normal. No respiratory distress.     Breath sounds: Normal breath sounds. No wheezing.  Musculoskeletal:     Cervical back: Neck supple.  Skin:    General: Skin is warm and dry.  Neurological:     Mental Status: She is alert and oriented to person, place, and time.  Psychiatric:        Mood and Affect: Mood normal.        Behavior: Behavior normal.        Thought Content: Thought content normal.        Judgment: Judgment normal.      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 Partial relief from previous treatment. Persistent pain in hip and knee. - Continue gabapentin  as needed for pain management. - Continue to follow up with orthopedics for further evaluation and management.  - Consider MRI if pain persists.      Relevant Medications   buPROPion  (  WELLBUTRIN  XL) 150 MG 24 hr tablet   venlafaxine  XR (EFFEXOR -XR) 150 MG 24 hr capsule   Osteoarthritis of left knee    Severe anterior knee osteoarthritis with bone-on-bone contact. Ongoing specialist evaluation. - Continue physical therapy for knee osteoarthritis. - Follow up with orthopedics for further management.      Hyperlipidemia   Lab Results  Component Value Date   CHOL 161 04/07/2023   HDL 55.20 04/07/2023   LDLCALC 94 04/07/2023   LDLDIRECT 107.0 05/12/2021   TRIG 57.0 04/07/2023   CHOLHDL 3 04/07/2023          Relevant Medications   amLODipine  (NORVASC ) 10 MG tablet   rosuvastatin  (CRESTOR ) 10 MG tablet   valsartan -hydrochlorothiazide  (DIOVAN -HCT) 160-25 MG tablet   Other Relevant Orders   Lipid panel   Comp Met (CMET)   Hyperglycemia    Borderline diabetes with recent weight loss indicating progress. - Continue healthy diet and exercise regimen. - Monitor blood glucose levels regularly.       Essential hypertension    Blood pressure well-controlled. Inconsistent medication timing reported. - Continue amlodipine  and valsartan -hydrochlorothiazide  daily. - Advised to establish a consistent medication schedule, avoiding bedtime due to diuretic component.      Relevant Medications   amLODipine  (NORVASC ) 10 MG tablet   rosuvastatin  (CRESTOR ) 10 MG tablet   valsartan -hydrochlorothiazide  (DIOVAN -HCT) 160-25 MG tablet   Depression with anxiety   Mood appears stable on wellbutrin /effexor .      Relevant Medications   buPROPion  (WELLBUTRIN  XL) 150 MG 24 hr tablet   venlafaxine  XR (EFFEXOR -XR) 150 MG 24 hr capsule   Other Visit Diagnoses       Abnormal mammogram    -  Primary   Relevant Orders   MM Digital Diagnostic Bilat      Assessment & Plan  General health maintenance Discussion about prescription management due to change in pharmacy provider. - Sent prescriptions to [facility] for home delivery. - Instructed to contact [facility] for prescription management.    I have discontinued Wanda Williams's methylPREDNISolone . I have also changed her buPROPion  and venlafaxine  XR. Additionally, I am having her maintain her gabapentin , amLODipine , rosuvastatin , and valsartan -hydrochlorothiazide .  Meds ordered this encounter  Medications   buPROPion  (WELLBUTRIN  XL) 150 MG 24 hr tablet    Sig: Take 1 tablet (150 mg total) by mouth daily.    Dispense:  90 tablet    Refill:  1    Please send a replace/new response with 90-Day Supply if appropriate to maximize member benefit. Requesting  1 year supply.    Supervising Provider:   DOMENICA BLACKBIRD A [[id number]]   venlafaxine  XR (EFFEXOR -XR) 150 MG 24 hr capsule    Sig: Take 1 capsule (150 mg total) by mouth daily with breakfast.    Dispense:  90 capsule    Refill:  1    Please send a replace/new response with 90-Day Supply if appropriate to maximize member benefit. Requesting 1 year supply.    Supervising Provider:   DOMENICA BLACKBIRD A [[id number]]   amLODipine  (NORVASC ) 10 MG tablet    Sig: Take 1 tablet (10 mg total) by mouth daily.    Dispense:  90 tablet    Refill:  1    Supervising Provider:   DOMENICA BLACKBIRD A [[id number]]   rosuvastatin  (CRESTOR ) 10 MG tablet    Sig: Take 1 tablet Monday, Wednesdays and Fridays for 7 days, then start taking daily    Dispense:  30 tablet    Refill:  3  Supervising Provider:   DOMENICA BLACKBIRD A [[id number]]   valsartan -hydrochlorothiazide  (DIOVAN -HCT) 160-25 MG tablet    Sig: Take 1 tablet by mouth daily.    Dispense:  90 tablet    Refill:  1    Please send a replace/new response with 90-Day Supply if appropriate to maximize member benefit. Requesting 1 year supply.    Supervising Provider:   DOMENICA BLACKBIRD A [[id number]]      [1]  Allergies Allergen Reactions   Ace Inhibitors Cough   "

## 2024-04-10 NOTE — Assessment & Plan Note (Signed)
" °  Blood pressure well-controlled. Inconsistent medication timing reported. - Continue amlodipine  and valsartan -hydrochlorothiazide  daily. - Advised to establish a consistent medication schedule, avoiding bedtime due to diuretic component. "

## 2024-04-10 NOTE — Assessment & Plan Note (Signed)
" °  Borderline diabetes with recent weight loss indicating progress. - Continue healthy diet and exercise regimen. - Monitor blood glucose levels regularly.  "

## 2024-04-10 NOTE — Assessment & Plan Note (Signed)
 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 Partial relief from previous treatment. Persistent pain in hip and knee. - Continue gabapentin  as needed for pain management. - Continue to follow up with orthopedics for further evaluation and management.  - Consider MRI if pain persists.

## 2024-04-10 NOTE — Assessment & Plan Note (Signed)
" °  Severe anterior knee osteoarthritis with bone-on-bone contact. Ongoing specialist evaluation. - Continue physical therapy for knee osteoarthritis. - Follow up with orthopedics for further management. "

## 2024-05-03 ENCOUNTER — Ambulatory Visit: Admitting: Family
